# Patient Record
Sex: Female | Born: 1980 | Race: White | Hispanic: No | Marital: Single | Smoking: Former smoker
Health system: Southern US, Community
[De-identification: ages and names within clinical notes are randomized; demographics above are authoritative.]

## PROBLEM LIST (undated history)

## (undated) DIAGNOSIS — G8929 Other chronic pain: Secondary | ICD-10-CM

## (undated) DIAGNOSIS — I5023 Acute on chronic systolic (congestive) heart failure: Secondary | ICD-10-CM

## (undated) DIAGNOSIS — I10 Essential (primary) hypertension: Secondary | ICD-10-CM

## (undated) DIAGNOSIS — R51 Headache: Secondary | ICD-10-CM

## (undated) DIAGNOSIS — I5022 Chronic systolic (congestive) heart failure: Secondary | ICD-10-CM

## (undated) DIAGNOSIS — R0789 Other chest pain: Secondary | ICD-10-CM

## (undated) DIAGNOSIS — R519 Headache, unspecified: Secondary | ICD-10-CM

## (undated) DIAGNOSIS — Z9119 Patient's noncompliance with other medical treatment and regimen: Secondary | ICD-10-CM

## (undated) DIAGNOSIS — I110 Hypertensive heart disease with heart failure: Secondary | ICD-10-CM

## (undated) DIAGNOSIS — I447 Left bundle-branch block, unspecified: Secondary | ICD-10-CM

## (undated) DIAGNOSIS — I5020 Unspecified systolic (congestive) heart failure: Secondary | ICD-10-CM

## (undated) DIAGNOSIS — D509 Iron deficiency anemia, unspecified: Secondary | ICD-10-CM

## (undated) DIAGNOSIS — M549 Dorsalgia, unspecified: Secondary | ICD-10-CM

## (undated) DIAGNOSIS — M5430 Sciatica, unspecified side: Secondary | ICD-10-CM

## (undated) DIAGNOSIS — I429 Cardiomyopathy, unspecified: Secondary | ICD-10-CM

## (undated) DIAGNOSIS — Z91199 Patient's noncompliance with other medical treatment and regimen due to unspecified reason: Secondary | ICD-10-CM

## (undated) DIAGNOSIS — D649 Anemia, unspecified: Secondary | ICD-10-CM

## (undated) HISTORY — PX: TUBAL LIGATION: SHX77

## (undated) HISTORY — DX: Chronic systolic (congestive) heart failure: I50.22

## (undated) HISTORY — PX: CHOLECYSTECTOMY: SHX55

---

## 2004-01-20 ENCOUNTER — Inpatient Hospital Stay: Payer: Self-pay

## 2004-05-02 ENCOUNTER — Emergency Department: Payer: Self-pay | Admitting: Emergency Medicine

## 2004-07-19 ENCOUNTER — Emergency Department: Payer: Self-pay | Admitting: Emergency Medicine

## 2004-07-20 ENCOUNTER — Ambulatory Visit: Payer: Self-pay | Admitting: Emergency Medicine

## 2005-01-20 ENCOUNTER — Emergency Department: Payer: Self-pay | Admitting: Emergency Medicine

## 2005-01-20 ENCOUNTER — Other Ambulatory Visit: Payer: Self-pay

## 2005-05-04 ENCOUNTER — Emergency Department: Payer: Self-pay | Admitting: Emergency Medicine

## 2005-07-13 ENCOUNTER — Emergency Department: Payer: Self-pay | Admitting: Internal Medicine

## 2005-08-07 ENCOUNTER — Emergency Department: Payer: Self-pay | Admitting: Internal Medicine

## 2005-08-08 ENCOUNTER — Ambulatory Visit: Payer: Self-pay | Admitting: Internal Medicine

## 2005-10-13 ENCOUNTER — Emergency Department: Payer: Self-pay | Admitting: Emergency Medicine

## 2006-01-22 ENCOUNTER — Ambulatory Visit: Payer: Self-pay | Admitting: Internal Medicine

## 2006-03-11 ENCOUNTER — Emergency Department: Payer: Self-pay | Admitting: Unknown Physician Specialty

## 2006-05-20 ENCOUNTER — Ambulatory Visit: Payer: Self-pay | Admitting: Pain Medicine

## 2006-05-21 ENCOUNTER — Ambulatory Visit: Payer: Self-pay | Admitting: Pain Medicine

## 2006-05-29 ENCOUNTER — Emergency Department: Payer: Self-pay | Admitting: Emergency Medicine

## 2006-05-29 ENCOUNTER — Other Ambulatory Visit: Payer: Self-pay

## 2006-08-25 ENCOUNTER — Emergency Department: Payer: Self-pay | Admitting: Emergency Medicine

## 2006-09-02 ENCOUNTER — Emergency Department: Payer: Self-pay | Admitting: Emergency Medicine

## 2006-09-04 ENCOUNTER — Emergency Department: Payer: Self-pay | Admitting: Emergency Medicine

## 2006-09-16 ENCOUNTER — Other Ambulatory Visit: Payer: Self-pay

## 2006-09-16 ENCOUNTER — Inpatient Hospital Stay: Payer: Self-pay | Admitting: Internal Medicine

## 2007-03-06 ENCOUNTER — Emergency Department: Payer: Self-pay | Admitting: Unknown Physician Specialty

## 2007-03-24 ENCOUNTER — Encounter: Admission: RE | Admit: 2007-03-24 | Discharge: 2007-03-24 | Payer: Self-pay | Admitting: Neurology

## 2007-03-27 ENCOUNTER — Emergency Department: Payer: Self-pay | Admitting: Emergency Medicine

## 2007-04-03 ENCOUNTER — Ambulatory Visit: Payer: Self-pay | Admitting: Family Medicine

## 2007-04-24 ENCOUNTER — Emergency Department: Payer: Self-pay | Admitting: Emergency Medicine

## 2007-05-07 ENCOUNTER — Encounter: Payer: Self-pay | Admitting: Family Medicine

## 2007-06-21 ENCOUNTER — Emergency Department: Payer: Self-pay | Admitting: Emergency Medicine

## 2007-09-07 ENCOUNTER — Emergency Department: Payer: Self-pay

## 2008-06-27 ENCOUNTER — Emergency Department: Payer: Self-pay | Admitting: Emergency Medicine

## 2008-08-01 ENCOUNTER — Emergency Department: Payer: Self-pay | Admitting: Emergency Medicine

## 2008-10-01 ENCOUNTER — Emergency Department: Payer: Self-pay | Admitting: Internal Medicine

## 2008-11-27 ENCOUNTER — Emergency Department: Payer: Self-pay | Admitting: Emergency Medicine

## 2009-06-01 ENCOUNTER — Emergency Department: Payer: Self-pay | Admitting: Emergency Medicine

## 2009-08-29 ENCOUNTER — Emergency Department: Payer: Self-pay | Admitting: Emergency Medicine

## 2010-01-29 ENCOUNTER — Emergency Department: Payer: Self-pay | Admitting: Emergency Medicine

## 2010-05-29 ENCOUNTER — Emergency Department: Payer: Self-pay | Admitting: Emergency Medicine

## 2010-09-25 ENCOUNTER — Inpatient Hospital Stay: Payer: Self-pay | Admitting: Internal Medicine

## 2010-09-25 DIAGNOSIS — R9431 Abnormal electrocardiogram [ECG] [EKG]: Secondary | ICD-10-CM

## 2010-09-25 DIAGNOSIS — I517 Cardiomegaly: Secondary | ICD-10-CM

## 2010-09-25 DIAGNOSIS — R7989 Other specified abnormal findings of blood chemistry: Secondary | ICD-10-CM

## 2010-09-26 DIAGNOSIS — R9431 Abnormal electrocardiogram [ECG] [EKG]: Secondary | ICD-10-CM

## 2010-09-26 DIAGNOSIS — R7989 Other specified abnormal findings of blood chemistry: Secondary | ICD-10-CM

## 2010-10-24 ENCOUNTER — Emergency Department: Payer: Self-pay | Admitting: Emergency Medicine

## 2010-12-07 ENCOUNTER — Emergency Department: Payer: Self-pay | Admitting: *Deleted

## 2010-12-14 ENCOUNTER — Emergency Department: Payer: Self-pay | Admitting: Internal Medicine

## 2010-12-30 ENCOUNTER — Emergency Department: Payer: Self-pay | Admitting: Unknown Physician Specialty

## 2011-01-08 HISTORY — PX: CARDIAC CATHETERIZATION: SHX172

## 2011-01-23 ENCOUNTER — Emergency Department: Payer: Self-pay | Admitting: Internal Medicine

## 2011-01-23 LAB — TROPONIN I: Troponin-I: <0.02 ng/mL

## 2011-01-23 LAB — COMPREHENSIVE METABOLIC PANEL
Anion Gap: 15 (ref 7–16)
Bilirubin,Total: 0.5 mg/dL (ref 0.2–1.0)
Chloride: 103 mmol/L (ref 98–107)
Co2: 27 mmol/L (ref 21–32)
Creatinine: 0.67 mg/dL (ref 0.60–1.30)
EGFR (African American): >60
EGFR (Non-African Amer.): >60
Glucose: 97 mg/dL (ref 65–99)
Osmolality: 287 (ref 275–301)
Potassium: 2.9 mmol/L — ABNORMAL LOW (ref 3.5–5.1)
SGOT(AST): 20 U/L (ref 15–37)
SGPT (ALT): 13 U/L

## 2011-01-23 LAB — URINALYSIS, COMPLETE
Bacteria: NONE SEEN
Bilirubin,UR: NEGATIVE
Blood: NEGATIVE
Glucose,UR: NEGATIVE mg/dL (ref 0–75)
Leukocyte Esterase: NEGATIVE
Nitrite: NEGATIVE
Ph: 6 (ref 4.5–8.0)
Protein: 30
RBC,UR: <1 /HPF (ref 0–5)
Squamous Epithelial: 1
WBC UR: 2 /HPF (ref 0–5)

## 2011-01-23 LAB — CBC
HCT: 30.3 % — ABNORMAL LOW (ref 35.0–47.0)
HGB: 10 g/dL — ABNORMAL LOW (ref 12.0–16.0)
MCH: 27.2 pg (ref 26.0–34.0)
MCV: 83 fL (ref 80–100)
Platelet: 358 10*3/uL (ref 150–440)

## 2011-01-23 LAB — DRUG SCREEN, URINE
Barbiturates, Ur Screen: NEGATIVE (ref ?–200)
Cannabinoid 50 Ng, Ur ~~LOC~~: NEGATIVE (ref ?–50)
MDMA (Ecstasy)Ur Screen: NEGATIVE (ref ?–500)
Methadone, Ur Screen: NEGATIVE (ref ?–300)
Opiate, Ur Screen: POSITIVE (ref ?–300)
Tricyclic, Ur Screen: NEGATIVE (ref ?–1000)

## 2011-01-23 LAB — PROTIME-INR: Prothrombin Time: 12.2 secs (ref 11.5–14.7)

## 2011-01-23 LAB — CK TOTAL AND CKMB (NOT AT ARMC)
CK, Total: 48 U/L (ref 21–215)
CK-MB: 0.6 ng/mL (ref 0.5–3.6)

## 2011-05-08 DIAGNOSIS — I5022 Chronic systolic (congestive) heart failure: Secondary | ICD-10-CM

## 2011-05-08 HISTORY — DX: Chronic systolic (congestive) heart failure: I50.22

## 2011-06-06 ENCOUNTER — Inpatient Hospital Stay (HOSPITAL_COMMUNITY): Payer: Medicaid Other

## 2011-06-06 ENCOUNTER — Encounter (HOSPITAL_COMMUNITY): Payer: Self-pay

## 2011-06-06 ENCOUNTER — Inpatient Hospital Stay (HOSPITAL_COMMUNITY)
Admission: EM | Admit: 2011-06-06 | Discharge: 2011-06-12 | DRG: 287 | Disposition: A | Discharge status: Home / Self Care | Payer: Medicaid Other | Attending: Internal Medicine | Admitting: Internal Medicine

## 2011-06-06 DIAGNOSIS — R809 Proteinuria, unspecified: Secondary | ICD-10-CM

## 2011-06-06 DIAGNOSIS — Z9089 Acquired absence of other organs: Secondary | ICD-10-CM

## 2011-06-06 DIAGNOSIS — I10 Essential (primary) hypertension: Secondary | ICD-10-CM

## 2011-06-06 DIAGNOSIS — I447 Left bundle-branch block, unspecified: Secondary | ICD-10-CM

## 2011-06-06 DIAGNOSIS — G43909 Migraine, unspecified, not intractable, without status migrainosus: Secondary | ICD-10-CM | POA: Diagnosis not present

## 2011-06-06 DIAGNOSIS — Z91199 Patient's noncompliance with other medical treatment and regimen due to unspecified reason: Secondary | ICD-10-CM

## 2011-06-06 DIAGNOSIS — J45909 Unspecified asthma, uncomplicated: Secondary | ICD-10-CM | POA: Diagnosis present

## 2011-06-06 DIAGNOSIS — M25579 Pain in unspecified ankle and joints of unspecified foot: Secondary | ICD-10-CM | POA: Diagnosis not present

## 2011-06-06 DIAGNOSIS — I5041 Acute combined systolic (congestive) and diastolic (congestive) heart failure: Secondary | ICD-10-CM

## 2011-06-06 DIAGNOSIS — Z9119 Patient's noncompliance with other medical treatment and regimen: Secondary | ICD-10-CM

## 2011-06-06 DIAGNOSIS — D500 Iron deficiency anemia secondary to blood loss (chronic): Secondary | ICD-10-CM | POA: Diagnosis present

## 2011-06-06 DIAGNOSIS — I509 Heart failure, unspecified: Secondary | ICD-10-CM | POA: Diagnosis present

## 2011-06-06 DIAGNOSIS — D649 Anemia, unspecified: Secondary | ICD-10-CM

## 2011-06-06 DIAGNOSIS — I5023 Acute on chronic systolic (congestive) heart failure: Secondary | ICD-10-CM | POA: Diagnosis present

## 2011-06-06 DIAGNOSIS — E876 Hypokalemia: Secondary | ICD-10-CM

## 2011-06-06 DIAGNOSIS — D509 Iron deficiency anemia, unspecified: Secondary | ICD-10-CM

## 2011-06-06 DIAGNOSIS — Z8249 Family history of ischemic heart disease and other diseases of the circulatory system: Secondary | ICD-10-CM

## 2011-06-06 DIAGNOSIS — M7989 Other specified soft tissue disorders: Secondary | ICD-10-CM

## 2011-06-06 DIAGNOSIS — Z9889 Other specified postprocedural states: Secondary | ICD-10-CM

## 2011-06-06 DIAGNOSIS — Z9851 Tubal ligation status: Secondary | ICD-10-CM

## 2011-06-06 DIAGNOSIS — Z87891 Personal history of nicotine dependence: Secondary | ICD-10-CM

## 2011-06-06 DIAGNOSIS — I5020 Unspecified systolic (congestive) heart failure: Secondary | ICD-10-CM

## 2011-06-06 DIAGNOSIS — I110 Hypertensive heart disease with heart failure: Principal | ICD-10-CM

## 2011-06-06 DIAGNOSIS — I428 Other cardiomyopathies: Secondary | ICD-10-CM | POA: Diagnosis present

## 2011-06-06 HISTORY — DX: Essential (primary) hypertension: I10

## 2011-06-06 LAB — RAPID URINE DRUG SCREEN, HOSP PERFORMED
Barbiturates: NOT DETECTED
Benzodiazepines: POSITIVE — AB

## 2011-06-06 LAB — COMPREHENSIVE METABOLIC PANEL
AST: 11 U/L (ref 0–37)
Albumin: 3.3 g/dL — ABNORMAL LOW (ref 3.5–5.2)
Alkaline Phosphatase: 54 U/L (ref 39–117)
BUN: 14 mg/dL (ref 6–23)
CO2: 26 mEq/L (ref 19–32)
CO2: 24 mEq/L (ref 19–32)
Calcium: 8.7 mg/dL (ref 8.4–10.5)
Chloride: 104 mEq/L (ref 96–112)
GFR calc Af Amer: >90 mL/min (ref >90)
Glucose, Bld: 109 mg/dL — ABNORMAL HIGH (ref 70–99)
Glucose, Bld: 121 mg/dL — ABNORMAL HIGH (ref 70–99)
Potassium: 2.9 mEq/L — ABNORMAL LOW (ref 3.5–5.1)
Sodium: 139 mEq/L (ref 135–145)
Total Bilirubin: 0.4 mg/dL (ref 0.3–1.2)
Total Bilirubin: 0.4 mg/dL (ref 0.3–1.2)
Total Protein: 5.9 g/dL — ABNORMAL LOW (ref 6.0–8.3)

## 2011-06-06 LAB — DIFFERENTIAL
Eosinophils Absolute: 0.2 10*3/uL (ref 0.0–0.7)
Eosinophils Relative: 3 % (ref 0–5)
Lymphocytes Relative: 26 % (ref 12–46)
Lymphs Abs: 1.7 10*3/uL (ref 0.7–4.0)
Monocytes Absolute: 0.4 10*3/uL (ref 0.1–1.0)
Monocytes Relative: 6 % (ref 3–12)

## 2011-06-06 LAB — CBC
HCT: 26.9 % — ABNORMAL LOW (ref 36.0–46.0)
MCH: 22.2 pg — ABNORMAL LOW (ref 26.0–34.0)
MCV: 78.7 fL (ref 78.0–100.0)
Platelets: 227 10*3/uL (ref 150–400)
RBC: 3.42 MIL/uL — ABNORMAL LOW (ref 3.87–5.11)
WBC: 6.3 10*3/uL (ref 4.0–10.5)

## 2011-06-06 LAB — LIPID PANEL
LDL Cholesterol: 99 mg/dL (ref 0–99)
Triglycerides: 101 mg/dL (ref <150)

## 2011-06-06 LAB — ABO/RH: ABO/RH(D): B NEG

## 2011-06-06 LAB — CARDIAC PANEL(CRET KIN+CKTOT+MB+TROPI)
CK, MB: 3.6 ng/mL (ref 0.3–4.0)
Troponin I: <0.3 ng/mL (ref <0.30)

## 2011-06-06 LAB — BLOOD GAS, ARTERIAL
Acid-base deficit: 1.5 mmol/L (ref 0.0–2.0)
Bicarbonate: 22.2 mEq/L (ref 20.0–24.0)
FIO2: 0.21 %
O2 Saturation: 98 %
Patient temperature: 98.6
pO2, Arterial: 82.2 mmHg (ref 80.0–100.0)

## 2011-06-06 LAB — URINALYSIS, ROUTINE W REFLEX MICROSCOPIC
Glucose, UA: NEGATIVE mg/dL
Hgb urine dipstick: NEGATIVE
Ketones, ur: NEGATIVE mg/dL
Leukocytes, UA: NEGATIVE
Protein, ur: 30 mg/dL — AB
pH: 6.5 (ref 5.0–8.0)

## 2011-06-06 LAB — IRON AND TIBC
Saturation Ratios: 6 % — ABNORMAL LOW (ref 20–55)
UIBC: 356 ug/dL (ref 125–400)

## 2011-06-06 LAB — D-DIMER, QUANTITATIVE: D-Dimer, Quant: 1.74 ug/mL-FEU — ABNORMAL HIGH (ref 0.00–0.48)

## 2011-06-06 LAB — MAGNESIUM: Magnesium: 1.8 mg/dL (ref 1.5–2.5)

## 2011-06-06 LAB — URINE MICROSCOPIC-ADD ON

## 2011-06-06 LAB — RETICULOCYTES
RBC.: 3.56 MIL/uL — ABNORMAL LOW (ref 3.87–5.11)
Retic Ct Pct: 4.2 % — ABNORMAL HIGH (ref 0.4–3.1)

## 2011-06-06 LAB — HIV ANTIBODY (ROUTINE TESTING W REFLEX): HIV: NONREACTIVE

## 2011-06-06 MED ORDER — MORPHINE SULFATE 4 MG/ML IJ SOLN
4.0000 mg | Freq: Once | INTRAMUSCULAR | Status: AC
  Administered 2011-06-06: 4 mg via INTRAVENOUS
  Filled 2011-06-06: qty 1

## 2011-06-06 MED ORDER — POTASSIUM CHLORIDE CRYS ER 20 MEQ PO TBCR
40.0000 meq | EXTENDED_RELEASE_TABLET | Freq: Once | ORAL | Status: AC
  Administered 2011-06-06: 40 meq via ORAL
  Filled 2011-06-06: qty 2

## 2011-06-06 MED ORDER — ENOXAPARIN SODIUM 40 MG/0.4ML ~~LOC~~ SOLN
40.0000 mg | SUBCUTANEOUS | Status: DC
  Administered 2011-06-07 – 2011-06-10 (×4): 40 mg via SUBCUTANEOUS
  Filled 2011-06-06 (×7): qty 0.4

## 2011-06-06 MED ORDER — MORPHINE SULFATE 2 MG/ML IJ SOLN
1.0000 mg | INTRAMUSCULAR | Status: DC | PRN
  Administered 2011-06-06: 1 mg via INTRAVENOUS
  Filled 2011-06-06 (×2): qty 1

## 2011-06-06 MED ORDER — SODIUM CHLORIDE 0.9 % IV SOLN
INTRAVENOUS | Status: DC
  Administered 2011-06-06: 75 mL/h via INTRAVENOUS

## 2011-06-06 MED ORDER — POTASSIUM CHLORIDE CRYS ER 20 MEQ PO TBCR
40.0000 meq | EXTENDED_RELEASE_TABLET | Freq: Three times a day (TID) | ORAL | Status: DC
  Administered 2011-06-06 – 2011-06-07 (×2): 40 meq via ORAL
  Filled 2011-06-06 (×4): qty 2

## 2011-06-06 MED ORDER — HYDRALAZINE HCL 20 MG/ML IJ SOLN: 10.0000 mg | Freq: Three times a day (TID) | INTRAMUSCULAR | Status: DC | PRN

## 2011-06-06 MED ORDER — ATENOLOL 50 MG PO TABS
50.0000 mg | ORAL_TABLET | Freq: Every day | ORAL | Status: DC
  Administered 2011-06-06: 50 mg via ORAL
  Filled 2011-06-06: qty 1

## 2011-06-06 MED ORDER — ONDANSETRON HCL 4 MG PO TABS: 4.0000 mg | ORAL_TABLET | Freq: Four times a day (QID) | ORAL | Status: DC | PRN

## 2011-06-06 MED ORDER — HYDRALAZINE HCL 20 MG/ML IJ SOLN
10.0000 mg | Freq: Four times a day (QID) | INTRAMUSCULAR | Status: DC | PRN
  Administered 2011-06-07 (×2): 10 mg via INTRAVENOUS
  Filled 2011-06-06 (×2): qty 1
  Filled 2011-06-06: qty 0.5

## 2011-06-06 MED ORDER — LABETALOL HCL 100 MG PO TABS
100.0000 mg | ORAL_TABLET | Freq: Two times a day (BID) | ORAL | Status: DC
  Administered 2011-06-06 – 2011-06-07 (×2): 100 mg via ORAL
  Filled 2011-06-06 (×3): qty 1

## 2011-06-06 MED ORDER — SODIUM CHLORIDE 0.9 % IV BOLUS (SEPSIS)
500.0000 mL | Freq: Once | INTRAVENOUS | Status: AC
  Administered 2011-06-06: 500 mL via INTRAVENOUS

## 2011-06-06 MED ORDER — MOXIFLOXACIN HCL IN NACL 400 MG/250ML IV SOLN
400.0000 mg | Freq: Every day | INTRAVENOUS | Status: DC
  Administered 2011-06-07 – 2011-06-09 (×3): 400 mg via INTRAVENOUS
  Filled 2011-06-06 (×6): qty 250

## 2011-06-06 MED ORDER — HYDROCODONE-ACETAMINOPHEN 5-325 MG PO TABS
1.0000 | ORAL_TABLET | ORAL | Status: DC | PRN
  Administered 2011-06-06 – 2011-06-10 (×11): 2 via ORAL
  Administered 2011-06-10 (×2): 1 via ORAL
  Administered 2011-06-12: 2 via ORAL
  Filled 2011-06-06 (×8): qty 2
  Filled 2011-06-06: qty 1
  Filled 2011-06-06 (×3): qty 2
  Filled 2011-06-06: qty 1

## 2011-06-06 MED ORDER — ONDANSETRON HCL 4 MG/2ML IJ SOLN: 4.0000 mg | Freq: Four times a day (QID) | INTRAMUSCULAR | Status: DC | PRN

## 2011-06-06 NOTE — H&P (Signed)
PCP:  No primary provider on file.   DOA:  06/06/2011  7:39 AM  Chief Complaint:  Lower extremity swelling  HPI: Pt is 31 yo female with no significant PMH who presents to [facility] ED with main concerns of progressively worsening lower extremity swelling, new onset and no previous similar episodes in the past. This has been getting progressively worse over the past 1 - 2 weeks prior to admission. She also reports mostly exertional shortness of breath and generalized fatigue. She denies any fever, chills, other systemic symptoms of night sweats, weight loss or gain, no specific abdominal or urinary concerns, no specific focal neurologic weakness, no headaches, no visual changes, no lumps or nodules noted anywhere on her body.   Allergies: Allergies  Allergen Reactions  . Doxycycline Shortness Of Breath    Prior to Admission medications   Not on File    Past Medical History  Diagnosis Date  . Hypertension     Past Surgical History  Procedure Date  . Cesarean section   . Cholecystectomy   . Tubal ligation     Social History:  reports that she has quit smoking. She quit smokeless tobacco use about 5 years ago. She reports that she does not drink alcohol or use illicit drugs.  Family History  Problem Relation Age of Onset  . Heart failure Mother   . Hypertension Mother     Review of Systems:  Constitutional: Denies fever, chills, diaphoresis, appetite change, positive for fatigue.  HEENT: Denies photophobia, eye pain, hearing loss, ear pain, congestion, sore throat, rhinorrhea, sneezing, mouth sores, trouble swallowing, neck pain, neck stiffness and tinnitus.   Respiratory: Positive for  SOB, DOE, cough, denies chest tightness,  and wheezing.   Cardiovascular: Denies chest pain, palpitations, positive for leg swelling.  Gastrointestinal: Denies nausea, vomiting, abdominal pain, diarrhea, constipation, blood in stool and abdominal distention.  Genitourinary: Denies dysuria,  urgency, frequency, hematuria, flank pain and difficulty urinating.  Musculoskeletal: Denies myalgias, back pain, joint swelling, arthralgias and gait problem.  Skin: Denies pallor, rash and wound.  Neurological: Denies dizziness, seizures, syncope, weakness, light-headedness, numbness and headaches.  Hematological: Denies adenopathy. Easy bruising, personal or family bleeding history  Psychiatric/Behavioral: Denies suicidal ideation, mood changes, confusion, nervousness, sleep disturbance and agitation  Physical Exam:  Filed Vitals:   06/06/11 0930 06/06/11 1000 06/06/11 1030 06/06/11 1100  BP: 179/108 181/121 173/124 172/113  Pulse: 104 104 104 103  Temp:      TempSrc:      Resp:      SpO2: 95% 91% 96% 93%    Constitutional: Vital signs reviewed.  Patient is in no acute distress and cooperative with exam. Alert and oriented x3.  Head: Normocephalic and atraumatic Ear: TM normal bilaterally Mouth: no erythema or exudates, MMM Eyes: PERRL, EOMI, conjunctivae normal, No scleral icterus.  Neck: Supple, Trachea midline normal ROM, No JVD, mass, thyromegaly, or carotid bruit present.  Cardiovascular: Regular rhythm, tachycardic, S1 normal, S2 normal, no MRG, pulses symmetric and intact bilaterally Pulmonary/Chest: CTAB, no wheezes, rales, or rhonchi Abdominal: Soft. Non-tender, non-distended, bowel sounds are normal, no masses, organomegaly, or guarding present.  GU: no CVA tenderness Musculoskeletal: No joint deformities, erythema, or stiffness, ROM full and no nontender Ext: +2 bilateral pitting lower extremity edema, no cyanosis, pulses palpable bilaterally (DP and PT) Hematology: no cervical, inginal, or axillary adenopathy.  Neurological: A&O x3, Strenght is normal and symmetric bilaterally, cranial nerve II-XII are grossly intact, no focal motor deficit, sensory intact to light  touch bilaterally.  Skin: Warm, dry and intact. No rash, cyanosis, or clubbing.  Psychiatric: Normal  mood and affect. speech and behavior is normal. Judgment and thought content normal. Cognition and memory are normal.   Labs on Admission:      Component Value Range   Sodium 139  135 - 145 (mEq/L)   Potassium 2.3 (*) 3.5 - 5.1 (mEq/L)   Chloride 103  96 - 112 (mEq/L)   CO2 26  19 - 32 (mEq/L)   Glucose, Bld 109  70 - 99 (mg/dL)   BUN 13  6 - 23 (mg/dL)   Creatinine, Ser 1.61  0.50 - 1.10 (mg/dL)   Calcium 8.7  8.4 - 09.6 (mg/dL)   Total Protein 5.9 (*) 6.0 - 8.3 (g/dL)   Albumin 3.3 (*) 3.5 - 5.2 (g/dL)   AST 11  0 - 37 (U/L)   ALT 15  0 - 35 (U/L)   Alkaline Phosphatase 52  39 - 117 (U/L)   Total Bilirubin 0.4  0.3 - 1.2 (mg/dL)   GFR calc non Af Amer >90  >90 (mL/min)   GFR calc Af Amer >90  >90 (mL/min)          Component Value Range   WBC 6.3  4.0 - 10.5 (K/uL)   Hemoglobin 7.6 (*) 12.0 - 15.0 (g/dL)   HCT 04.5 (*) 40.9 - 46.0 (%)   MCV 78.7  78.0 - 100.0 (fL)   Platelets 227  150 - 400 (K/uL)          Component Value Range   Color, Urine YELLOW  YELLOW    APPearance CLEAR  CLEAR    Specific Gravity, Urine 1.029  1.005 - 1.030    pH 6.5  5.0 - 8.0    Glucose, UA NEGATIVE  NEGATIVE (mg/dL)   Hgb urine dipstick NEGATIVE  NEGATIVE    Bilirubin Urine SMALL (*) NEGATIVE    Ketones, ur NEGATIVE  NEGATIVE (mg/dL)   Protein, ur 30 (*) NEGATIVE (mg/dL)   Urobilinogen, UA 1.0  0.0 - 1.0 (mg/dL)   Nitrite NEGATIVE  NEGATIVE    Leukocytes, UA NEGATIVE  NEGATIVE           Component Value Range   Preg Test, Ur NEGATIVE  NEGATIVE           Component Value Range   Squamous Epithelial / LPF RARE  RARE    WBC, UA 3-6  <3 (WBC/hpf)   RBC / HPF 0-2  <3 (RBC/hpf)   Bacteria, UA MANY (*) RARE     Radiological Exams on Admission:  CXR: 06/06/2011 IMPRESSION:  1. Somewhat prominent interstitial markings with peribronchial thickening. Possible bronchitis but cannot exclude viral pneumonia.  2. Cardiomegaly.  Renal US 06/06/2011 Negative for acute  findings  Assessment/Plan  Lower extremity swelling - unclear etiology at this time but given degree of proteinuria worrisome for nephrotic syndrome - will admit the pt to telemetry for further evaluation and management - proceed with autoimmune disease work up and nephrotic syndrome work up  Acute respiratory failure of unclear etiology  - unclear etiology but per CXR noted cardiomegaly and ? PNA - d-dimer elevated - will check CT angio chest to rule put PE - order 2 D ECHO - follow up on BNP, cardiac enzymes - initiate empiric antibiotic for now Avelox IV - will check ABG  HTN, accelaerated - accelerated and unclear etiology - will start Labetalol 100 mg BID PO - check FLP  Anemia  -  unclear etiology - will order anemia panel - transfuse 2 units PRBC's - CBC in AM  Hypokalemia - magnesium is within normal limits - will supplement  - check 12 lead EKG - BMP now for follow up  Proteinuria - unclear etiology - will check autoimmune panel - please note that renal US is unremarkable - random urine spot protein  Cardiomegaly  - noted on CXR - will check CE's and 2 D ECHO  DVT Prophylaxis - Lovenox  Code Status - Full  Education  - test results and diagnostic studies were discussed with patient  - patient verbalized the understanding - questions were answered at the bedside and contact information was provided for additional questions or concerns  Time Spent on Admission: Over 30 minutes  MAGICK-Azzan Butler 06/06/2011, 11:20 AM  [facility] Pager # [phone] Main Office # [phone]

## 2011-06-06 NOTE — Progress Notes (Signed)
CM spoke with pt who confirms self pay [facility] [place] resident with no have a pcp county. Recently moved from [place] and had medicaid in [place] but she confirms she let it elapse. Discussed the importance of a pcp for f/u. Reviewed Health connect number to assist with finding self pay provider close to pt's new residence. Reviewed resources for [facility], general medial clinics, [facility] [email or web address], housing, DSS, health Department and other resources in [place] including crisis programs Pt voiced understanding and appreciation of resources provided.

## 2011-06-06 NOTE — ED Notes (Signed)
[place] Tech in room performing test.

## 2011-06-06 NOTE — ED Provider Notes (Signed)
History     CSN: [id number]  Arrival date & time 06/06/11  4098   First MD Initiated Contact with Patient 06/06/11 [phone]      Chief Complaint  Patient presents with  . Leg Swelling    (Consider location/radiation/quality/duration/timing/severity/associated sxs/prior treatment) The history is provided by the patient.   the patient is a 31 year old, female, with a history of hypertension, who presents to emergency department complaining of bilateral leg swelling and pain.  She denies fevers, chills, rash.  She does have a slight nonproductive cough, and shortness of breath, which is worse.  At nighttime.  She denies recent travel or surgery.  She denies a history of renal or liver disease.  Past Medical History  Diagnosis Date  . Hypertension     Past Surgical History  Procedure Date  . Cesarean section   . Cholecystectomy   . Tubal ligation     Family History  Problem Relation Age of Onset  . Heart failure Mother   . Hypertension Mother     History  Substance Use Topics  . Smoking status: Former Games developer  . Smokeless tobacco: Former Neurosurgeon    Quit date: 06/06/2006  . Alcohol Use: No    OB History    Grav Para Term Preterm Abortions TAB SAB Ect Mult Living                  Review of Systems  Constitutional: Negative for fever and chills.  Respiratory: Positive for cough and shortness of breath. Negative for chest tightness.   Cardiovascular: Positive for leg swelling. Negative for chest pain.  Gastrointestinal: Negative for nausea, vomiting and abdominal pain.  Genitourinary: Negative for dysuria and flank pain.  Musculoskeletal: Positive for gait problem.  Skin: Negative for rash.  Neurological: Negative for headaches.  Psychiatric/Behavioral: Negative for confusion.  All other systems reviewed and are negative.    Allergies  Doxycycline  Home Medications  No current outpatient prescriptions on file.  BP 168/112  Pulse 109  Temp(Src) 97.5 F (36.4 C)  (Oral)  Resp 18  SpO2 98%  LMP 05/08/2011  Physical Exam  Nursing note and vitals reviewed. Constitutional: She is oriented to person, place, and time. She appears well-developed and well-nourished.  HENT:  Head: Normocephalic and atraumatic.  Eyes: Conjunctivae and EOM are normal.  Neck: Normal range of motion. Neck supple.  Cardiovascular:  No murmur heard.      Tachycardia  Pulmonary/Chest: Effort normal and breath sounds normal. No respiratory distress.  Abdominal: Soft. Bowel sounds are normal. She exhibits no mass.  Musculoskeletal: Normal range of motion. She exhibits edema and tenderness.       2+ pitting edema, bilateral lower extremities from the feet to shin  Neurological: She is alert and oriented to person, place, and time.  Skin: Skin is warm and dry. No rash noted. No erythema.  Psychiatric: She has a normal mood and affect. Thought content normal.    ED Course  Procedures (including critical care time) 31 year old, female, with history of hypertension, presents with bilateral lower extremity pain, and swelling.  No signs of cellulitis.  No risk factors for pulmonary embolism, or DVT.  She does have a cough, and shortness of breath.  I do not hear a murmur and she has no risk factors for cardiomyopathy.  We'll perform laboratory testing, and treat her pain in the emergency department   Labs Reviewed  COMPREHENSIVE METABOLIC PANEL  CBC  DIFFERENTIAL  URINALYSIS, ROUTINE W REFLEX MICROSCOPIC  PREGNANCY, URINE   No results found.   No diagnosis found.  11:10 AM Pt appears listless.  Will give more K and consult for admission to eval possible nephrotic syndrome, hypokalemia and anemia  11:22 AM Spoke with the [facility] md.  She will admit  MDM   Bilateral leg pain and swelling Hypokalemia Proteinuria anemia        Cheri Guppy, MD 06/06/11 1122

## 2011-06-06 NOTE — ED Notes (Signed)
Patient states she has had increased swelling to feet, legs, and now thighs swelling. Patient states that she has a non productive cough and SOB at night. Patient denies fever or joint pain.

## 2011-06-07 ENCOUNTER — Other Ambulatory Visit: Payer: Self-pay

## 2011-06-07 DIAGNOSIS — I5020 Unspecified systolic (congestive) heart failure: Secondary | ICD-10-CM

## 2011-06-07 DIAGNOSIS — D649 Anemia, unspecified: Secondary | ICD-10-CM

## 2011-06-07 DIAGNOSIS — R809 Proteinuria, unspecified: Secondary | ICD-10-CM

## 2011-06-07 DIAGNOSIS — I10 Essential (primary) hypertension: Secondary | ICD-10-CM

## 2011-06-07 DIAGNOSIS — M7989 Other specified soft tissue disorders: Secondary | ICD-10-CM

## 2011-06-07 DIAGNOSIS — I447 Left bundle-branch block, unspecified: Secondary | ICD-10-CM

## 2011-06-07 DIAGNOSIS — E876 Hypokalemia: Secondary | ICD-10-CM

## 2011-06-07 DIAGNOSIS — R0602 Shortness of breath: Secondary | ICD-10-CM

## 2011-06-07 LAB — CARDIAC PANEL(CRET KIN+CKTOT+MB+TROPI)
CK, MB: 3.1 ng/mL (ref 0.3–4.0)
Total CK: 38 U/L (ref 7–177)

## 2011-06-07 LAB — ANCA SCREEN W REFLEX TITER: c-ANCA Screen: NEGATIVE

## 2011-06-07 LAB — CBC
HCT: 31.7 % — ABNORMAL LOW (ref 36.0–46.0)
Hemoglobin: 9.3 g/dL — ABNORMAL LOW (ref 12.0–15.0)
MCH: 23 pg — ABNORMAL LOW (ref 26.0–34.0)
MCV: 78.3 fL (ref 78.0–100.0)
RBC: 4.05 MIL/uL (ref 3.87–5.11)
WBC: 8.4 10*3/uL (ref 4.0–10.5)

## 2011-06-07 LAB — MPO/PR-3 (ANCA) ANTIBODIES: Myeloperoxidase Abs: <1 AU/mL (ref <20)

## 2011-06-07 LAB — BASIC METABOLIC PANEL
BUN: 13 mg/dL (ref 6–23)
CO2: 20 mEq/L (ref 19–32)
Calcium: 8.7 mg/dL (ref 8.4–10.5)
Chloride: 102 mEq/L (ref 96–112)
Creatinine, Ser: 0.61 mg/dL (ref 0.50–1.10)
Glucose, Bld: 115 mg/dL — ABNORMAL HIGH (ref 70–99)

## 2011-06-07 LAB — URINALYSIS, ROUTINE W REFLEX MICROSCOPIC
Ketones, ur: NEGATIVE mg/dL
Leukocytes, UA: NEGATIVE
Nitrite: NEGATIVE
Protein, ur: 30 mg/dL — AB

## 2011-06-07 LAB — URINE MICROSCOPIC-ADD ON

## 2011-06-07 LAB — HEMOGLOBIN A1C: Mean Plasma Glucose: 100 mg/dL (ref <117)

## 2011-06-07 LAB — C4 COMPLEMENT: Complement C4, Body Fluid: 23 mg/dL (ref 10–40)

## 2011-06-07 MED ORDER — SODIUM CHLORIDE 0.9 % IJ SOLN: 3.0000 mL | Freq: Two times a day (BID) | INTRAMUSCULAR | Status: DC

## 2011-06-07 MED ORDER — SPIRONOLACTONE 12.5 MG HALF TABLET
12.5000 mg | ORAL_TABLET | Freq: Every day | ORAL | Status: DC
  Administered 2011-06-07 – 2011-06-08 (×2): 12.5 mg via ORAL
  Filled 2011-06-07 (×4): qty 1

## 2011-06-07 MED ORDER — PANTOPRAZOLE SODIUM 40 MG IV SOLR
40.0000 mg | Freq: Every day | INTRAVENOUS | Status: DC
  Administered 2011-06-07 – 2011-06-09 (×3): 40 mg via INTRAVENOUS
  Filled 2011-06-07 (×4): qty 40

## 2011-06-07 MED ORDER — ASPIRIN 81 MG PO CHEW
324.0000 mg | CHEWABLE_TABLET | ORAL | Status: AC
  Administered 2011-06-10: 324 mg via ORAL
  Filled 2011-06-07: qty 4

## 2011-06-07 MED ORDER — SODIUM CHLORIDE 0.9 % IV SOLN: 250.0000 mL | INTRAVENOUS | Status: DC | PRN

## 2011-06-07 MED ORDER — POTASSIUM CHLORIDE CRYS ER 20 MEQ PO TBCR
40.0000 meq | EXTENDED_RELEASE_TABLET | Freq: Every day | ORAL | Status: DC
  Filled 2011-06-07: qty 2

## 2011-06-07 MED ORDER — DIAZEPAM 5 MG PO TABS: 5.0000 mg | ORAL_TABLET | ORAL | Status: DC

## 2011-06-07 MED ORDER — RAMIPRIL 5 MG PO CAPS
5.0000 mg | ORAL_CAPSULE | Freq: Every day | ORAL | Status: DC
  Administered 2011-06-07 – 2011-06-12 (×5): 5 mg via ORAL
  Filled 2011-06-07 (×6): qty 1

## 2011-06-07 MED ORDER — FUROSEMIDE 10 MG/ML IJ SOLN
40.0000 mg | Freq: Once | INTRAMUSCULAR | Status: DC
  Filled 2011-06-07: qty 4

## 2011-06-07 MED ORDER — FERROUS SULFATE 325 (65 FE) MG PO TABS
325.0000 mg | ORAL_TABLET | Freq: Two times a day (BID) | ORAL | Status: DC
  Administered 2011-06-07 – 2011-06-11 (×8): 325 mg via ORAL
  Filled 2011-06-07 (×13): qty 1

## 2011-06-07 MED ORDER — IOHEXOL 300 MG/ML  SOLN
100.0000 mL | Freq: Once | INTRAMUSCULAR | Status: AC | PRN
  Administered 2011-06-07: 100 mL via INTRAVENOUS

## 2011-06-07 MED ORDER — CARVEDILOL 6.25 MG PO TABS
6.2500 mg | ORAL_TABLET | Freq: Two times a day (BID) | ORAL | Status: DC
  Administered 2011-06-07 – 2011-06-09 (×5): 6.25 mg via ORAL
  Filled 2011-06-07 (×10): qty 1

## 2011-06-07 MED ORDER — SODIUM CHLORIDE 0.9 % IJ SOLN: 3.0000 mL | INTRAMUSCULAR | Status: DC | PRN

## 2011-06-07 MED ORDER — FUROSEMIDE 10 MG/ML IJ SOLN
40.0000 mg | Freq: Two times a day (BID) | INTRAMUSCULAR | Status: DC
  Administered 2011-06-07 (×2): 40 mg via INTRAVENOUS
  Filled 2011-06-07 (×6): qty 4

## 2011-06-07 NOTE — Progress Notes (Signed)
Pt was experiencing some SOB towards end of first blood transfusion. Pt had experienced slight SOB before blood administration earlier in the night.  Pt also complained of worsening indigestion, sharp pains in her chest.  PA on call notified, Lance Coon. New orders received, will continue to monitor pt.

## 2011-06-07 NOTE — Consult Note (Addendum)
CARDIOLOGY CONSULT NOTE  Patient ID: Laurie Wade MRN: [id number] DOB/AGE: 1980-10-24 31 y.o.  Admit date: 06/06/2011 Referring Physician Primary PhysicianNo primary provider on file. Primary Cardiologist    Karlene Lineman Reason for Consultation  HPI:  The patient is admitted with shortness of breath and fluid overload. D-dimer was elevated but CT scan reveals no pulmonary embolus. There are bilateral pleural effusions. There is a very small pericardial effusion. The patient has significant edema. The patient says that she had some edema at times when she was pregnant in the past. Her youngest child is 81 years old. She said she had some brief edema several weeks ago that improved. Then recently she began to have increasing edema. She was admitted with shortness of breath, pleural effusions, hemoglobin of 7.6. We are asked to consult to help with further evaluation. Also she had significant hypokalemia on admission.  Echocardiogram was done early today. She has a significant interventricular conduction delay. There is significant dyssynergy of the septum. This could be related to left bundle. However in addition there is akinesis of the inferior wall and severe hypokinesis of the anterior wall. The inferolateral wall and the lateral walls move better than the other walls. There is hypokinesis of the apex. The ejection fraction is in the 30% range. There is moderate left ventricular hypertrophy with mild mitral regurgitation. There is questionable left ventricular band. There is diastolic dysfunction with elevated filling pressures. There is a small pericardial effusion. There may also be mild right ventricular dysfunction.  The patient gives no prior history of cardiac disease. At this time I cannot document how long she has had a left bundle.     Review of systems:    Patient denies fever, chills, headache, sweats, rash, change in vision, change in hearing, chest pain, nausea vomiting,  urinary symptoms. All other systems are reviewed and are negative.  Past Medical History  Diagnosis Date  . Hypertension   . Asthma     Family History  Problem Relation Age of Onset  . Heart failure Mother   . Hypertension Mother     History   Social History  . Marital Status: Single    Spouse Name: N/A    Number of Children: N/A  . Years of Education: N/A   Occupational History  . Not on file.   Social History Main Topics  . Smoking status: Former Games developer  . Smokeless tobacco: Former Neurosurgeon    Quit date: 06/06/2006  . Alcohol Use: No  . Drug Use: No  . Sexually Active: No   Other Topics Concern  . Not on file   Social History Narrative  . No narrative on file    Past Surgical History  Procedure Date  . Cesarean section   . Cholecystectomy   . Tubal ligation      No prescriptions prior to admission    Physical Exam: Blood pressure 111/73, pulse 91, temperature 96.9 F (36.1 C), temperature source Axillary, resp. rate 20, height 5\' 6"  (1.676 m), weight 189 lb 11.2 oz (86.047 kg), last menstrual period 05/08/2011, SpO2 99.00%.    Patient is oriented to person time and place. Affect is normal. There is no jugulovenous distention. Lungs reveal a few scattered rhonchi and possibly a few basilar rales. There no carotid bruits. There is no respiratory distress. Cardiac exam reveals S1 and S2. There no clicks or significant murmurs. The abdomen is soft. The patient has 2-3+ peripheral edema up into her upper thighs.  There no musculoskeletal deformities. There are no skin rashes.    Lab Results  Component Value Date   WBC 8.4 06/07/2011   HGB 9.3* 06/07/2011   HCT 31.7* 06/07/2011   MCV 78.3 06/07/2011   PLT 293 06/07/2011    Lab 06/07/11 0750 06/06/11 1615  NA 134* --  K 3.7 --  CL 102 --  CO2 20 --  BUN 13 --  CREATININE 0.61 --  CALCIUM 8.7 --  PROT -- 5.8*  BILITOT -- 0.4  ALKPHOS -- 54  ALT -- 16  AST -- 13  GLUCOSE 115* --   Lab Results  Component  Value Date   CKTOTAL 38 06/07/2011   CKMB 3.1 06/07/2011   TROPONINI <0.30 06/07/2011    Lab Results  Component Value Date   CHOL 144 06/06/2011   Lab Results  Component Value Date   HDL 25* 06/06/2011   Lab Results  Component Value Date   LDLCALC 99 06/06/2011   Lab Results  Component Value Date   TRIG 101 06/06/2011   Lab Results  Component Value Date   CHOLHDL 5.8 06/06/2011   No results found for this basename: LDLDIRECT      Radiology: Dg Chest 2 View  06/06/2011  *RADIOLOGY REPORT*  Clinical Data: Shortness of breath, chest pain, cough  CHEST - 2 VIEW  Comparison: None.  Findings: No pneumonia is seen.  However there are somewhat prominent markings bilaterally with peribronchial thickening.  This could indicate bronchitis, but a viral pneumonia would be difficult to exclude.  There is mild cardiomegaly present.  No bony abnormality is seen.  IMPRESSION:  1.  Somewhat prominent interstitial markings with peribronchial thickening.  Possible bronchitis but cannot exclude viral pneumonia. 2.  Cardiomegaly.  Original Report Authenticated By: Juline Patch, M.D.   Ct Angio Chest W/cm &/or Wo Cm  06/07/2011  *RADIOLOGY REPORT*  Clinical Data: Cough with chest pain and shortness of breath. Elevated D-dimer proteinuria with lower extremity swelling.  CT ANGIOGRAPHY CHEST  Technique:  Multidetector CT imaging of the chest using the standard protocol during bolus administration of intravenous contrast. Multiplanar reconstructed images including MIPs were obtained and reviewed to evaluate the vascular anatomy.  Contrast: OMNIPAQUE IOHEXOL 300 MG/ML  SOLN  Comparison: Chest x-ray earlier today.  Findings: Moderate sized right pleural effusion with smaller significant left effusion.  Cardiomegaly with mild to moderate pericardial effusion.  There are no focal infiltrates or masses. The esophagus is normal except for a apparent tail lodged in the distal esophagus.  Mild stranding left lung  base. No visible hilar or mediastinal masses are significant adenopathy.  Good opacification of the pulmonary vasculature and aorta.  No evidence for pulmonary embolic disease.  No significant atherosclerosis of the arch or  great vessels.  Question faint tiny calcification left main coronary artery. Normal thoracic spine and sternum.  No displaced rib fracture.  The Cuts of the upper abdomen are grossly unremarkable.  Compared the prior chest x-ray, the effusions are difficult to visualize.  IMPRESSION: Bilateral pleural effusions, right greater than left along with pericardial effusion.  Cardiomegaly.  No underlying infiltrate, and no evidence for pulmonary emboli. The effusions could be consistent with clinical impression of possible nephrotic syndrome.  Radiopaque density lodged in distal esophagus, likely pill.  Original Report Authenticated By: Elsie Stain, M.D.   US Renal  06/06/2011  *RADIOLOGY REPORT*  Clinical Data: Proteinuria.  RENAL/URINARY TRACT ULTRASOUND COMPLETE  Comparison:  None.  Findings:  Right  Kidney:  Measures 11.2 cm and appears normal without stone, mass or hydronephrosis.  Cortical echogenicity is also normal.  Left Kidney:  Measures 10.6 cm and appears normal without stone, mass or hydronephrosis.  Cortical echogenicity is normal.  Bladder:  Normal appearance.  IMPRESSION: Normal study.  Original Report Authenticated By: Bernadene Bell. D'ALESSIO, M.D.   EKG:  I have reviewed the EKG. There is left bundle branch block with sinus rhythm.  ASSESSMENT AND PLAN:   At this point it is difficult to have 1 consolidating diagnosis. Patient presents with severe anemia, hypokalemia, left bundle branch block, unexplained left ventricular dysfunction, volume overload. We will have to address each of the issues individually.   Hypertension   Patient presented with severe Hypertension. She says that she was on only a beta blocker before. It is possible that she could have hypertensive  cardiovascular disease. However her echo shows focal abnormalities. Some of this is related to the bundle-branch block. However I cannot rule out coronary disease. We will need to follow her blood pressure.   Systolic CHF    The patient has systolic congestive heart failure that is acute. She needs to be diuresis. In addition medications he did be added as tolerated for her left ventricular dysfunction.   Anemia   Etiology of the anemia is not clear at this point. It is possible that some of her CHF could be high output failure leading to left ventricular dysfunction. However focal wall motion abnormalities seem to argue against this.   LBBB (left bundle branch block) We do not know how long she's had left bundle branch block.   Cardiomyopathy    Patient's ejection fraction is in the range of 30%. There is left bundle branch block. There is septal dyssynergy that could be related to the left bundle. However in addition there appears to be severe hypokinesis of the inferior wall and severe hypokinesis of the anterior wall. The inferolateral wall and lateral walls move better than the other walls. If tolerated she needs to be on a beta blocker and ACE inhibitor. She needs to be diuresed. She also needs to undergo cardiac catheterization this admission so that we can better understand her overall status. I did explain to the patient that we would want to proceed with cardiac catheterization during this admission. If the etiology of the patient's cardiomyopathy does not become clearer over time, especially after the catheterization, I would consult the advanced heart failure team to help assess this young woman.  Hypokalemia   Etiology of presenting with hypokalemia is not clear to me. I suspect a consideration has to be given to ruling out primary hyperaldosteronism.  However, I'm not sure how anemia would be related to this. Her potassium needs to be treated and normalized.  Jerral Bonito, MD

## 2011-06-07 NOTE — Progress Notes (Signed)
[facility] progress note. Chief complaint. Transfer note. History of present illness. 31 year old female thin in hospital at [facility] long with dyspnea on exertion. She is noted do with a bilateral lower extremity edema thought secondary to systolic CHF. Also noted in hospital next elevated hypertension with blood pressure medications being adjusted along with diuresis for the congestive heart failure. Also anemia present requiring transfusion packed red blood cells. Etiology of anemia is yet unclear. Patient transferred to [facility] today for further investigation and treatment. Cardiology is following along seen by Dr. Myrtis Ser earlier today. I'm seeing the patient at bedside to ensure her stability post transfer and ensure per orders transferred appropriately. Vital signs. Temperature 97.5, pulse 90, respiration 14, blood pressure 131/91. O2 sats 100%. General appearance. Well-developed female in no distress. Alert, oriented, cooperative. Cardiac. Rate and rhythm regular. No jugular venous distention. She does have 2+ pedal edema extending to at least the knee bilaterally. Lungs. Breath sounds are clear and equal bilaterally. No crackles appreciated. Abdomen. Soft with positive bowel sounds. No pain. Impression/plan. Problem #1 congestive heart failure. Patient will continue with a Lasix diuresis. Cardiology is following as well. Problem #2 accelerated hypertension. We'll follow along blood pressure on current medications. Problem #3 Anemia. We'll follow with a CBC with a.m. labs. Patient clinically stable per bedside exam. All orders appear to have transferred appropriately.

## 2011-06-07 NOTE — Significant Event (Signed)
Discussed with Dr. Myrtis Ser and he is in agreement to transfer pt to [facility] for further evaluation and management.  Debbora Presto [facility], pager #: [phone] Main office number: [phone]

## 2011-06-07 NOTE — Progress Notes (Signed)
PA on call L. [place] assessed pt due to complaints of SOB. PA decided that pt should not receive 2nd unit of blood until cardiologist sees the pt today to assess further.  Will continue to monitor pt.

## 2011-06-07 NOTE — Plan of Care (Signed)
Problem: Phase I Progression Outcomes Goal: EF % per last Echo/documented,Core Reminder form on chart Outcome: Completed/Met Date Met:  06/07/11 Echo 15%

## 2011-06-07 NOTE — Progress Notes (Addendum)
Patient ID: Laurie Wade, female   DOB: Jun 15, 1980, 31 y.o.   MRN: [id number]  Subjective: No events overnight. Patient reports persistent and mostly exertional shortness of breath that is slightly worse since yesterday.  Objective:  Vital signs in last 24 hours:  Filed Vitals:   06/07/11 0330 06/07/11 0430 06/07/11 0500 06/07/11 0514  BP: 163/120 163/120 156/118 163/120  Pulse: 94 95 95 98  Temp: 97.9 F (36.6 C) 97.7 F (36.5 C) 97.9 F (36.6 C) 96.9 F (36.1 C)  Resp: 26 20 24 20   SpO2:    99%   Intake/Output from previous day:   Intake/Output Summary (Last 24 hours) at 06/07/11 0920 Last data filed at 06/07/11 1191  Gross per 24 hour  Intake 833.75 ml  Output   1125 ml  Net -291.25 ml    Physical Exam: General: Alert, awake, oriented x3, in no acute distress. HEENT: No bruits, no goiter. Moist mucous membranes, no scleral icterus, no conjunctival pallor. Heart: Regular rate and rhythm, S1/S2 +, no murmurs, rubs, gallops. Lungs: Clear to auscultation bilaterally with bibasilar crackles. No wheezing, no rhonchi, no rales.  Abdomen: Soft, nontender, nondistended, positive bowel sounds. Extremities: No clubbing or cyanosis, bilateral upper and lower extremity pitting edema slightly worse compared to yesterday,  positive pedal pulses. Neuro: Grossly nonfocal.  Lab Results:  Lab 06/07/11 0750 06/06/11 0840  WBC 8.4 6.3  HGB 9.3* 7.6*  HCT 31.7* 26.9*  PLT 293 227   Lab 06/07/11 0750 06/06/11 1615 06/06/11 0840  K 3.7 2.9* 2.3*  CL 102 104 103  CO2 20 24 26   GLUCOSE 115* 121* 109*  BUN 13 14 13   CREATININE 0.61 0.79 0.84  CALCIUM 8.7 8.7 8.7  MG 1.8 1.8 --   Cardiac markers:  Lab 06/07/11 0750 06/06/11 2355 06/06/11 1613  CKMB 3.1 3.3 3.6  TROPONINI <0.30 <0.30 <0.30  MYOGLOBIN -- -- --    Studies/Results:  Dg Chest 2 View 06/06/2011    IMPRESSION:   1.  Somewhat prominent interstitial markings with peribronchial thickening.  Possible bronchitis but  cannot exclude viral pneumonia.  2.  Cardiomegaly.   Ct Angio Chest W/cm &/or Wo Cm 06/07/2011    IMPRESSION:  Bilateral pleural effusions, right greater than left along with pericardial effusion.   Cardiomegaly.  No underlying infiltrate, and no evidence for pulmonary emboli.  The effusions could be consistent with clinical impression of possible nephrotic syndrome.  Radiopaque density lodged in distal esophagus, likely pill.    US Renal 06/06/2011     IMPRESSION:  Normal study.    Medications: Scheduled Meds:   . enoxaparin  40 mg Subcutaneous Q24H  . furosemide  40 mg Intravenous BID  . labetalol  100 mg Oral BID  .  morphine injection  4 mg Intravenous Once  . moxifloxacin  400 mg Intravenous q1800  . pantoprazole  IV  40 mg Intravenous Daily  . potassium chloride  40 mEq Oral Once  . potassium chloride  40 mEq Oral Once  . potassium chloride  40 mEq Oral TID   Continuous Infusions:   . DISCONTD: sodium chloride 75 mL/hr (06/06/11 1248)   PRN Meds:.hydrALAZINE, HYDROcodone-acetaminophen, iohexol, morphine injection, ondansetron (ZOFRAN) IV, ondansetron, DISCONTD: hydrALAZINE  Assessment/Plan:  Assessment/Plan  Lower extremity swelling  - unclear etiology at this time but given degree of proteinuria which is rather small I am not sure this can be contributed to nephrotic syndrom - ANA and complement levels ar within normal limits - at this  point as swelling is worse today will proceed with Lasix 40 mg IV and will start with BID dosing - will need to readjust the regimen as indicated - cardiology consult obtained  Acute respiratory failure of unclear etiology, ? Diastolic/Systolic CHF  - unclear etiology but per CXR noted cardiomegaly and ? PNA, ? Viral cardiomyopathy  - d-dimer elevated, CT angio confirmed bilateral pleural effusions with cardiomegaly, no PE - ordered 2 D ECHO and this is still pending - BNP is elevated and with other clinical symptoms suggestive of  volume overload will go ahead and proceed with Lasix IV - continue to follow up on strict I's and O's  HTN, accelaerated  - accelerated and unclear etiology  - will start Labetalol 100 mg BID PO, hydralazine PRN, Lasix 40 mg IV BID   Anemia  - consistent with iron deficiency - Hg and Hct now improved since yesterday and we can hold off on second blood unit transfusion - initiate Iron supplementation - CBC in AM   Hypokalemia  - magnesium is within normal limits  - this Am potassium is within normal limits - BMP in AM  Proteinuria  - unclear etiology, mild degree in the setting on normal kidney function tests, creatinine and GFR  - current autoimmune panel is unremarkable for specific diagnosis - Renal US is also unremarkable for acute events - spoke with Dr. Darrick Penna and he had agreed with autoimmune work up which is unremarkable so far - pt is maintaining good urine output  Cardiomegaly  - noted on CXR and confirmed with CT chest - CE's to this point are  Within normal limits - 2 D ECHO to be done today and will follow up results - cardiology consult obtained  DVT Prophylaxis - Lovenox   Code Status - Full   Education  - test results and diagnostic studies were discussed with patient  - patient verbalized the understanding  - questions were answered at the bedside and contact information was provided for additional questions or concerns    LOS: 1 day   MAGICK-Jaedon Siler 06/07/2011, 9:20 AM  [facility] Pager: [phone]

## 2011-06-07 NOTE — Progress Notes (Signed)
Report called to RN on 4700. [facility] picked up pt at 1445. K. Vear Clock RN

## 2011-06-07 NOTE — Progress Notes (Addendum)
  Asked by hospitalist service to evaluate patient for new-onset HF.   Chart reviewed closely including Dr. Henrietta Hoover note from earlier today. Echo images reviewed personally. Patient interviewed at length ad examined with her daughter present.   She is a 31 y/o woman with h/o severe uncontrolled HTN. Previously on Atenolol but she stopped this a while back. Denies any other medical problems except for migraines. Does note that she has heavy menstrual periods that used to last 3-4 days but more recently can last up to 10 days. Family history notable for significant in both her mother and grandmother in their 65-50s.   Developed progressive dyspnea and LE edema over past few weeks. No evidence of viral infection. No CP. No recent travel, rash or arthralgias. Says SBP often over 200.  On admit, BP 179/108. 2+ edema. Hgb 7.6. MCV 78 with low iron. UA with 30mg /dl protein. pBNP 3288. ECG with LBBB. Echo EF 15% with regional wall motion abnormalities and restrictive filling pattern. ANA/ANCA negative. Renal u/s normal.  On exam JVP about 9. +s3 1-2+ edema.   Impression:   Ms. 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 That said, there is a very significant FHx of premature CAD in both her mother and maternal grandmother and she will need a R and L heart cath on Monday with renal artery angiography to exclude an ischemic component as well as renal artery stenosis or FMD of the renals. Her proteinuria is most likely sequelae of her severe HTN. She also has iron deficiency anemia which appears to be due to her heavy menstrual cycles and she would likely benefit from a gyn evaluation and possible hormonal therapy. She will need careful contraception (?IUD) to avoid becoming pregnant while her heart is so weak.  Recs:  1) Continue diuresis with lasix 40 iv bid 2) Titrate ramipril and carvedilol as tolerated to get SBP 120-140 (would not push below this  for now) 3) Add spironolactone 4) Can add digoxin as needed 5) R/L cath with renal angio on Monday (scheduled) 6) Continue iron supplementation 7) Check FOBT 8) Gyn consult to help with contraception and management of heavy menstrual cycles  The HF team will follow.   Truman Hayward 6:44 PM

## 2011-06-07 NOTE — Progress Notes (Signed)
*  PRELIMINARY RESULTS* Echocardiogram 2D Echocardiogram has been performed.  Laurie Wade [facility] 06/07/2011, 10:30 AM

## 2011-06-08 DIAGNOSIS — I10 Essential (primary) hypertension: Secondary | ICD-10-CM

## 2011-06-08 DIAGNOSIS — R809 Proteinuria, unspecified: Secondary | ICD-10-CM

## 2011-06-08 DIAGNOSIS — M7989 Other specified soft tissue disorders: Secondary | ICD-10-CM

## 2011-06-08 DIAGNOSIS — I447 Left bundle-branch block, unspecified: Secondary | ICD-10-CM

## 2011-06-08 DIAGNOSIS — E876 Hypokalemia: Secondary | ICD-10-CM

## 2011-06-08 LAB — BASIC METABOLIC PANEL
Chloride: 106 mEq/L (ref 96–112)
GFR calc Af Amer: >90 mL/min (ref >90)
GFR calc non Af Amer: >90 mL/min (ref >90)
Potassium: 2.9 mEq/L — ABNORMAL LOW (ref 3.5–5.1)
Sodium: 142 mEq/L (ref 135–145)

## 2011-06-08 LAB — CBC
Hemoglobin: 8.2 g/dL — ABNORMAL LOW (ref 12.0–15.0)
MCHC: 28.9 g/dL — ABNORMAL LOW (ref 30.0–36.0)
RDW: 18.2 % — ABNORMAL HIGH (ref 11.5–15.5)
WBC: 6.5 10*3/uL (ref 4.0–10.5)

## 2011-06-08 LAB — PROTEIN, URINE, 24 HOUR: Protein, Urine: 5 mg/dL

## 2011-06-08 MED ORDER — POTASSIUM CHLORIDE CRYS ER 20 MEQ PO TBCR
40.0000 meq | EXTENDED_RELEASE_TABLET | Freq: Three times a day (TID) | ORAL | Status: DC
  Administered 2011-06-08 (×3): 40 meq via ORAL
  Filled 2011-06-08 (×6): qty 2

## 2011-06-08 MED ORDER — POTASSIUM CHLORIDE 10 MEQ/100ML IV SOLN
10.0000 meq | INTRAVENOUS | Status: AC
  Administered 2011-06-08 (×2): 10 meq via INTRAVENOUS
  Filled 2011-06-08 (×2): qty 100

## 2011-06-08 MED ORDER — FUROSEMIDE 40 MG PO TABS
40.0000 mg | ORAL_TABLET | Freq: Two times a day (BID) | ORAL | Status: DC
  Administered 2011-06-08 – 2011-06-09 (×3): 40 mg via ORAL
  Filled 2011-06-08 (×6): qty 1

## 2011-06-08 NOTE — Progress Notes (Signed)
Subjective: No new complaints.   Objective: Weight change: -0.947 kg (-2 lb 1.4 oz)  Intake/Output Summary (Last 24 hours) at 06/08/11 1651 Last data filed at 06/08/11 1246  Gross per 24 hour  Intake    938 ml  Output   1150 ml  Net   -212 ml    Filed Vitals:   06/08/11 1336  BP: 124/87  Pulse: 93  Temp: 97.5 F (36.4 C)  Resp: 19   General: No distress, comfortable sitting up and eating supper Neuro: no deficits HEENT:PERLA Neck: Supple  Cardiovascular: RRR, no murmurs  Lungs: CTAB  Abdomen: Soft, non tender, bowel sounds present  Musculoskeletal: TRACE edema ,  Skin: Intact   Lab Results: Results for orders placed during the hospital encounter of 06/06/11 (from the past 24 hour(s))  PROTEIN, URINE, 24 HOUR     Status: Abnormal   Collection Time   06/07/11  9:30 PM      Component Value Range   Urine Total Volume-UPROT 2850     Collection Interval-UPROT 24     Protein, Urine 5     Protein, 24H Urine 143 (*) 50 - 100 (mg/day)  CBC     Status: Abnormal   Collection Time   06/08/11  5:50 AM      Component Value Range   WBC 6.5  4.0 - 10.5 (K/uL)   RBC 3.58 (*) 3.87 - 5.11 (MIL/uL)   Hemoglobin 8.2 (*) 12.0 - 15.0 (g/dL)   HCT 04.5 (*) 40.9 - 46.0 (%)   MCV 79.3  78.0 - 100.0 (fL)   MCH 22.9 (*) 26.0 - 34.0 (pg)   MCHC 28.9 (*) 30.0 - 36.0 (g/dL)   RDW 81.1 (*) 91.4 - 15.5 (%)   Platelets 224  150 - 400 (K/uL)  BASIC METABOLIC PANEL     Status: Abnormal   Collection Time   06/08/11  5:50 AM      Component Value Range   Sodium 142  135 - 145 (mEq/L)   Potassium 2.9 (*) 3.5 - 5.1 (mEq/L)   Chloride 106  96 - 112 (mEq/L)   CO2 23  19 - 32 (mEq/L)   Glucose, Bld 92  70 - 99 (mg/dL)   BUN 13  6 - 23 (mg/dL)   Creatinine, Ser 7.82  0.50 - 1.10 (mg/dL)   Calcium 8.8  8.4 - 95.6 (mg/dL)   GFR calc non Af Amer >90  >90 (mL/min)   GFR calc Af Amer >90  >90 (mL/min)       Micro Results: No results found for this or any previous visit (from the past 240  hour(s)).  Studies/Results: Dg Chest 2 View  06/06/2011  *RADIOLOGY REPORT*  Clinical Data: Shortness of breath, chest pain, cough  CHEST - 2 VIEW  Comparison: None.  Findings: No pneumonia is seen.  However there are somewhat prominent markings bilaterally with peribronchial thickening.  This could indicate bronchitis, but a viral pneumonia would be difficult to exclude.  There is mild cardiomegaly present.  No bony abnormality is seen.  IMPRESSION:  1.  Somewhat prominent interstitial markings with peribronchial thickening.  Possible bronchitis but cannot exclude viral pneumonia. 2.  Cardiomegaly.  Original Report Authenticated By: Juline Patch, M.D.   Ct Angio Chest W/cm &/or Wo Cm  06/07/2011  *RADIOLOGY REPORT*  Clinical Data: Cough with chest pain and shortness of breath. Elevated D-dimer proteinuria with lower extremity swelling.  CT ANGIOGRAPHY CHEST  Technique:  Multidetector CT imaging of the  chest using the standard protocol during bolus administration of intravenous contrast. Multiplanar reconstructed images including MIPs were obtained and reviewed to evaluate the vascular anatomy.  Contrast: OMNIPAQUE IOHEXOL 300 MG/ML  SOLN  Comparison: Chest x-ray earlier today.  Findings: Moderate sized right pleural effusion with smaller significant left effusion.  Cardiomegaly with mild to moderate pericardial effusion.  There are no focal infiltrates or masses. The esophagus is normal except for a apparent tail lodged in the distal esophagus.  Mild stranding left lung base. No visible hilar or mediastinal masses are significant adenopathy.  Good opacification of the pulmonary vasculature and aorta.  No evidence for pulmonary embolic disease.  No significant atherosclerosis of the arch or  great vessels.  Question faint tiny calcification left main coronary artery. Normal thoracic spine and sternum.  No displaced rib fracture.  The Cuts of the upper abdomen are grossly unremarkable.  Compared the  prior chest x-ray, the effusions are difficult to visualize.  IMPRESSION: Bilateral pleural effusions, right greater than left along with pericardial effusion.  Cardiomegaly.  No underlying infiltrate, and no evidence for pulmonary emboli. The effusions could be consistent with clinical impression of possible nephrotic syndrome.  Radiopaque density lodged in distal esophagus, likely pill.  Original Report Authenticated By: Elsie Stain, M.D.   US Renal  06/06/2011  *RADIOLOGY REPORT*  Clinical Data: Proteinuria.  RENAL/URINARY TRACT ULTRASOUND COMPLETE  Comparison:  None.  Findings:  Right Kidney:  Measures 11.2 cm and appears normal without stone, mass or hydronephrosis.  Cortical echogenicity is also normal.  Left Kidney:  Measures 10.6 cm and appears normal without stone, mass or hydronephrosis.  Cortical echogenicity is normal.  Bladder:  Normal appearance.  IMPRESSION: Normal study.  Original Report Authenticated By: Bernadene Bell. Maricela Curet, M.D.   Medications: Scheduled Meds:   . aspirin  324 mg Oral Pre-Cath  . carvedilol  6.25 mg Oral BID WC  . diazepam  5 mg Oral On Call  . enoxaparin  40 mg Subcutaneous Q24H  . ferrous sulfate  325 mg Oral BID WC  . furosemide  40 mg Oral BID  . moxifloxacin  400 mg Intravenous q1800  . pantoprazole (PROTONIX) IV  40 mg Intravenous Daily  . potassium chloride  10 mEq Intravenous Q1 Hr x 2  . potassium chloride  40 mEq Oral TID  . ramipril  5 mg Oral Daily  . sodium chloride  3 mL Intravenous Q12H  . spironolactone  12.5 mg Oral Daily  . DISCONTD: furosemide  40 mg Intravenous BID  . DISCONTD: potassium chloride  40 mEq Oral Daily   Continuous Infusions:  PRN Meds:.sodium chloride, hydrALAZINE, HYDROcodone-acetaminophen, morphine injection, ondansetron (ZOFRAN) IV, ondansetron, sodium chloride  Assessment/Plan: Assessment/Plan  Lower extremity swelling  - unclear etiology at this time but given degree of proteinuria which is rather small I am not  sure this can be contributed to nephrotic syndrom  - ANA and complement levels ar within normal limits  - cardiology consult obtained recommended transfer to [facility] for possible Cath on Monday for evaluation of Cardiomyopathy contributing to the swelling.  Acute respiratory failure of unclear etiology, ? Diastolic/Systolic CHF  - unclear etiology but per CXR noted cardiomegaly and ? PNA, ? Viral cardiomyopathy  - d-dimer elevated, CT angio confirmed bilateral pleural effusions with cardiomegaly, no PE - ordered 2 D ECHO showing LVEF of 15% and akinesis of the anteroseptal and apical myocardium. And consistent with hight ventricular filling pressure.  - BNP is elevated and  with other clinical symptoms suggestive of volume overload will go ahead and proceed with Lasix IV  - strict I's and O's  Daily weights - continue with lasix po BID  -Continue with coreg, aspirin, and altace.  HTN, accelaerated  - better controlled.   Anemia  - consistent with iron deficiency  - CBC in AM  Hypokalemia  - replete as needed  - BMP in AM  Proteinuria  - unclear etiology, mild degree in the setting on normal kidney function tests, creatinine and GFR  - current autoimmune panel is unremarkable for specific diagnosis  - Renal US is also unremarkable for acute events  - spoke with Dr. Darrick Penna and he had agreed with autoimmune work up which is unremarkable so far  - pt is maintaining good urine output  Cardiomegaly  - noted on CXR and confirmed with CT chest  - CE's to this point are Within normal limits  - 2 D ECHO to be done today and will follow up results  - cardiology consult obtained  DVT Prophylaxis - Lovenox  Code Status - Full      LOS: 2 days   Keilee Denman 06/08/2011, 4:51 PM

## 2011-06-08 NOTE — Progress Notes (Signed)
PROGRESS NOTE  Subjective:   31 yo lady admitted with progressive dyspnea.  Also found to have LBBB, anemia. D-dimer was found to be elevated - subsequent CT angio of chest was negative for PE.  She feels about the same as yesterday. C/o sore legs She has diuresed about 3 liters.  K is low.    Objective:    Vital Signs:   Temp:  [97.2 F (36.2 C)-97.8 F (36.6 C)] 97.2 F (36.2 C) (06/01 0540) Pulse Rate:  [80-91] 81  (06/01 0540) Resp:  [14-20] 20  (06/01 0540) BP: (99-144)/(64-103) 123/82 mmHg (06/01 0540) SpO2:  [95 %-100 %] 97 % (06/01 0540) Weight:  [185 lb 3 oz (84 kg)-187 lb 9.8 oz (85.1 kg)] 185 lb 3 oz (84 kg) (06/01 0540)  Last BM Date: 06/05/11   24-hour weight change: Weight change: -2 lb 1.4 oz (-0.947 kg)  Weight trends: Filed Weights   06/06/11 2058 06/07/11 1557 06/08/11 0540  Weight: 189 lb 11.2 oz (86.047 kg) 187 lb 9.8 oz (85.1 kg) 185 lb 3 oz (84 kg)    Intake/Output:  05/31 0701 - 06/01 0700 In: 618 [P.O.:360; IV Piggyback:258] Out: 3100 [Urine:3100]     Physical Exam: BP 123/82  Pulse 81  Temp(Src) 97.2 F (36.2 C) (Oral)  Resp 20  Ht 5\' 6"  (1.676 m)  Wt 185 lb 3 oz (84 kg)  BMI 29.89 kg/m2  SpO2 97%  LMP 05/08/2011  General: Vital signs reviewed and noted. Well-developed, well-nourished, in no acute distress; alert, appropriate and cooperative throughout examination.  Head: Normocephalic, atraumatic.  Eyes: conjunctivae/corneas clear. PERRL, EOM's intact. Fundi benign.  Throat: normal  Neck: Supple. Normal carotids. No JVD  Lungs:  Clear bilaterally to auscultation   Heart: Regular rate,  With normal  S1 S2. Soft S3.  Abdomen:  Soft, non-tender, non-distended with normoactive bowel sounds. No hepatomegaly. No rebound/guarding. No abdominal masses.  Extremities: Distal pedal pulses are 2+ .  No edema.    Neurologic: A&O X3, CN II - XII are grossly intact. Motor strength is 5/5 in the all 4 extremities.  Psych: Responds to  questions appropriately with normal affect.    Labs: BMET:  Basename 06/08/11 0550 06/07/11 0750 06/06/11 1325  NA 142 134* --  K 2.9* 3.7 --  CL 106 102 --  CO2 23 20 --  GLUCOSE 92 115* --  BUN 13 13 --  CREATININE 0.78 0.61 --  CALCIUM 8.8 8.7 --  MG -- -- 1.8  PHOS -- -- 4.4    Liver function tests:  Basename 06/06/11 1615 06/06/11 0840  AST 13 11  ALT 16 15  ALKPHOS 54 52  BILITOT 0.4 0.4  PROT 5.8* 5.9*  ALBUMIN 3.3* 3.3*   No results found for this basename: LIPASE:2,AMYLASE:2 in the last 72 hours  CBC:  Basename 06/08/11 0550 06/07/11 0750 06/06/11 0840  WBC 6.5 8.4 --  NEUTROABS -- -- 4.1  HGB 8.2* 9.3* --  HCT 28.4* 31.7* --  MCV 79.3 78.3 --  PLT 224 293 --    Cardiac Enzymes:  Basename 06/07/11 0750 06/06/11 2355 06/06/11 1613  CKTOTAL 38 44 49  CKMB 3.1 3.3 3.6  TROPONINI <0.30 <0.30 <0.30    Coagulation Studies: No results found for this basename: LABPROT:5,INR:5 in the last 72 hours  Other: No components found with this basename: POCBNP:3  Basename 06/06/11 1615  DDIMER 1.74*    Basename 06/06/11 1325  HGBA1C 5.1    Basename 06/06/11 1615  CHOL 144  HDL 25*  LDLCALC 99  TRIG 161  CHOLHDL 5.8    Basename 06/06/11 1325  TSH 2.538  T4TOTAL --  T3FREE --  THYROIDAB --    Basename 06/06/11 1809  VITAMINB12 232  FOLATE 8.6  FERRITIN 11  TIBC 380  IRON 24*  RETICCTPCT 4.2*    Tele:  NSR. LBBB  Medications:    Infusions:    Scheduled Medications:    . aspirin  324 mg Oral Pre-Cath  . carvedilol  6.25 mg Oral BID WC  . diazepam  5 mg Oral On Call  . enoxaparin  40 mg Subcutaneous Q24H  . ferrous sulfate  325 mg Oral BID WC  . furosemide  40 mg Intravenous BID  . moxifloxacin  400 mg Intravenous q1800  . pantoprazole (PROTONIX) IV  40 mg Intravenous Daily  . potassium chloride  40 mEq Oral Daily  . ramipril  5 mg Oral Daily  . sodium chloride  3 mL Intravenous Q12H  . spironolactone  12.5 mg Oral Daily    . DISCONTD: furosemide  40 mg Intravenous Once  . DISCONTD: labetalol  100 mg Oral BID  . DISCONTD: potassium chloride  40 mEq Oral TID    Assessment/ Plan:     Hypertension (06/07/2011)   Much better on medicines.  She has had HTN for years - was not on any meds.   BP is normal today. I suspect this is the cause of her CHF  Systolic CHF (06/07/2011)   She does not have any edema but still has a soft S3. No JVD.  Will decrease Lasix.  Replace K. For R and left cath on Monday.  Anemia (06/07/2011) Plan per medical team  LBBB (left bundle branch block) (06/07/2011)    Disposition: cath monday Length of Stay: 2  Alvia Grove., MD, [facility] 06/08/2011, 8:41 AM Office [phone] Pager [phone]

## 2011-06-09 ENCOUNTER — Inpatient Hospital Stay (HOSPITAL_COMMUNITY): Payer: Medicaid Other

## 2011-06-09 DIAGNOSIS — I43 Cardiomyopathy in diseases classified elsewhere: Secondary | ICD-10-CM

## 2011-06-09 DIAGNOSIS — I5041 Acute combined systolic (congestive) and diastolic (congestive) heart failure: Secondary | ICD-10-CM

## 2011-06-09 DIAGNOSIS — M7989 Other specified soft tissue disorders: Secondary | ICD-10-CM

## 2011-06-09 DIAGNOSIS — I509 Heart failure, unspecified: Secondary | ICD-10-CM

## 2011-06-09 DIAGNOSIS — I517 Cardiomegaly: Secondary | ICD-10-CM

## 2011-06-09 DIAGNOSIS — I10 Essential (primary) hypertension: Secondary | ICD-10-CM

## 2011-06-09 DIAGNOSIS — I110 Hypertensive heart disease with heart failure: Secondary | ICD-10-CM

## 2011-06-09 DIAGNOSIS — D509 Iron deficiency anemia, unspecified: Secondary | ICD-10-CM

## 2011-06-09 LAB — BASIC METABOLIC PANEL
CO2: 22 mEq/L (ref 19–32)
Chloride: 109 mEq/L (ref 96–112)
GFR calc Af Amer: >90 mL/min (ref >90)
Potassium: 3.8 mEq/L (ref 3.5–5.1)

## 2011-06-09 LAB — MAGNESIUM: Magnesium: 1.9 mg/dL (ref 1.5–2.5)

## 2011-06-09 MED ORDER — HYDRALAZINE HCL 20 MG/ML IJ SOLN
10.0000 mg | Freq: Four times a day (QID) | INTRAMUSCULAR | Status: DC | PRN
  Administered 2011-06-10: 10 mg via INTRAVENOUS
  Filled 2011-06-09: qty 0.5

## 2011-06-09 MED ORDER — POTASSIUM CHLORIDE CRYS ER 20 MEQ PO TBCR
40.0000 meq | EXTENDED_RELEASE_TABLET | Freq: Two times a day (BID) | ORAL | Status: DC
  Administered 2011-06-09 – 2011-06-12 (×6): 40 meq via ORAL
  Filled 2011-06-09 (×7): qty 2

## 2011-06-09 MED ORDER — GUAIFENESIN-DM 100-10 MG/5ML PO SYRP: 15.0000 mL | ORAL_SOLUTION | ORAL | Status: DC | PRN

## 2011-06-09 MED ORDER — FUROSEMIDE 80 MG PO TABS
80.0000 mg | ORAL_TABLET | Freq: Two times a day (BID) | ORAL | Status: DC
  Administered 2011-06-09: 80 mg via ORAL
  Filled 2011-06-09 (×4): qty 1

## 2011-06-09 MED ORDER — ALUM & MAG HYDROXIDE-SIMETH 200-200-20 MG/5ML PO SUSP: 15.0000 mL | ORAL | Status: DC | PRN

## 2011-06-09 MED ORDER — LOPERAMIDE HCL 2 MG PO CAPS: 4.0000 mg | ORAL_CAPSULE | Freq: Once | ORAL | Status: AC | PRN

## 2011-06-09 MED ORDER — MAGNESIUM HYDROXIDE 400 MG/5ML PO SUSP
30.0000 mL | Freq: Every day | ORAL | Status: DC | PRN
  Administered 2011-06-09: 30 mL via ORAL
  Filled 2011-06-09: qty 30

## 2011-06-09 MED ORDER — DOCUSATE SODIUM 100 MG PO CAPS
100.0000 mg | ORAL_CAPSULE | Freq: Two times a day (BID) | ORAL | Status: DC | PRN
  Filled 2011-06-09: qty 1

## 2011-06-09 MED ORDER — SPIRONOLACTONE 25 MG PO TABS
25.0000 mg | ORAL_TABLET | Freq: Every day | ORAL | Status: DC
  Administered 2011-06-09: 25 mg via ORAL
  Filled 2011-06-09 (×3): qty 1

## 2011-06-09 MED ORDER — TRAMADOL-ACETAMINOPHEN 37.5-325 MG PO TABS
1.0000 | ORAL_TABLET | Freq: Four times a day (QID) | ORAL | Status: DC | PRN
  Filled 2011-06-09: qty 2

## 2011-06-09 MED ORDER — PRAMOXINE-ZINC OXIDE IN MO 1-12.5 % RE OINT
1.0000 "application " | TOPICAL_OINTMENT | Freq: Three times a day (TID) | RECTAL | Status: DC | PRN
  Filled 2011-06-09: qty 28.3

## 2011-06-09 NOTE — Progress Notes (Addendum)
Advanced Heart Failure Rounding Note   Subjective:    Feels OK. Ambulating room. Had migraine last night. No orthopnea or PND. I/O not recorded for this am. Weight going back up. BP up.   C/o pain in left ankle. Taking NARCO every 4 hours. No BM since Wednesday.  Objective:   Weight Range:  Vital Signs:   Temp:  [97.5 F (36.4 C)-98.7 F (37.1 C)] 98.7 F (37.1 C) (06/02 0724) Pulse Rate:  [81-100] 100  (06/02 0724) Resp:  [16-19] 16  (06/02 0724) BP: (124-156)/(87-117) 156/117 mmHg (06/02 0724) SpO2:  [100 %] 100 % (06/02 0724) Weight:  [85.4 kg (188 lb 4.4 oz)] 85.4 kg (188 lb 4.4 oz) (06/02 0724) Last BM Date: 07/06/11  Weight change: Filed Weights   06/07/11 1557 06/08/11 0540 06/09/11 0724  Weight: 85.1 kg (187 lb 9.8 oz) 84 kg (185 lb 3 oz) 85.4 kg (188 lb 4.4 oz)    Intake/Output:   Intake/Output Summary (Last 24 hours) at 06/09/11 0854 Last data filed at 06/09/11 0803  Gross per 24 hour  Intake    920 ml  Output      0 ml  Net    920 ml     Physical Exam: General:  Well appearing. No resp difficulty HEENT: normal Neck: supple. JVP 8 . Carotids 2+ bilat; no bruits. No lymphadenopathy or thryomegaly appreciated. Cor: PMI nondisplaced. Regular rate & rhythm. +s3 Lungs: clear Abdomen: soft, nontender, nondistended. No hepatosplenomegaly. No bruits or masses. Good bowel sounds. Extremities: no cyanosis, clubbing, rash, tr-1+edema Neuro: alert & orientedx3, cranial nerves grossly intact. moves all 4 extremities w/o difficulty. Flat affect.   Telemetry:  SR 95-110  Labs: Basic Metabolic Panel:  Lab 06/09/11 0640 06/08/11 0550 06/07/11 0750 06/06/11 1615 06/06/11 1325 06/06/11 0840  NA 143 142 134* 139 -- 139  K 3.8 2.9* 3.7 2.9* -- 2.3*  CL 109 106 102 104 -- 103  CO2 22 23 20 24 -- 26  GLUCOSE 94 92 115* 121* -- 109*  BUN 15 13 13 14 -- 13  CREATININE 0.78 0.78 0.61 0.79 -- 0.84  CALCIUM 9.0 8.8 8.7 -- -- --  MG 1.9 -- -- -- 1.8 --  PHOS -- -- -- --  4.4 --    Liver Function Tests:  Lab 06/06/11 1615 06/06/11 0840  AST 13 11  ALT 16 15  ALKPHOS 54 52  BILITOT 0.4 0.4  PROT 5.8* 5.9*  ALBUMIN 3.3* 3.3*   No results found for this basename: LIPASE:5,AMYLASE:5 in the last 168 hours No results found for this basename: AMMONIA:3 in the last 168 hours  CBC:  Lab 06/08/11 0550 06/07/11 0750 06/06/11 0840  WBC 6.5 8.4 6.3  NEUTROABS -- -- 4.1  HGB 8.2* 9.3* 7.6*  HCT 28.4* 31.7* 26.9*  MCV 79.3 78.3 78.7  PLT 224 293 227    Cardiac Enzymes:  Lab 06/07/11 0750 06/06/11 2355 06/06/11 1613  CKTOTAL 38 44 49  CKMB 3.1 3.3 3.6  CKMBINDEX -- -- --  TROPONINI <0.30 <0.30 <0.30    BNP: BNP (last 3 results)  Basename 06/06/11 1830  PROBNP 3288.0*      Imaging:  No results found.   Medications:     Scheduled Medications:    . aspirin  324 mg Oral Pre-Cath  . carvedilol  6.25 mg Oral BID WC  . diazepam  5 mg Oral On Call  . enoxaparin  40 mg Subcutaneous Q24H  . ferrous sulfate  325 mg   Oral BID WC  . furosemide  40 mg Oral BID  . moxifloxacin  400 mg Intravenous q1800  . pantoprazole (PROTONIX) IV  40 mg Intravenous Daily  . potassium chloride  10 mEq Intravenous Q1 Hr x 2  . potassium chloride  40 mEq Oral TID  . ramipril  5 mg Oral Daily  . sodium chloride  3 mL Intravenous Q12H  . spironolactone  12.5 mg Oral Daily     Infusions:     PRN Medications:  sodium chloride, hydrALAZINE, HYDROcodone-acetaminophen, morphine injection, ondansetron (ZOFRAN) IV, ondansetron, sodium chloride   Assessment:   1. 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 Severe HTN with associated HF 3. Iron-def anemia, suspect due to heavy menses 4. Migraine HAs   Plan/Discussion:    Improving but still with some volume on board. Will increase lasix to 80 po bid. Increase spiro 25 daily.  Plan cath tomorrow.  BP up slightly this am. Hopefully will improve with increased lasix and  spiro. Can titrate other meds as needed but don't want BP < 120 for now.  Will check uric acid to exclude gout as cause of L ankle pain. Hopefully primary team can wean narcotics.  Needs stool softener with iron supplementation. Will add colace. Give laxative today.  She has had tubal ligation previously but this is not sufficient contraception in setting of her severe HF and teratogenic risk of HF meds to possible fetus. Will need gyn to see soon regarding her heavy menstrual periods and to discuss possible IUD. Will ask primary team to help arrange.  Length of Stay: 3 Doren Kaspar 06/09/2011, 8:54 AM    

## 2011-06-09 NOTE — Progress Notes (Signed)
Subjective: Worried about the procedure tomorrow.   Objective: Weight change:   Intake/Output Summary (Last 24 hours) at 06/09/11 1151 Last data filed at 06/09/11 0803  Gross per 24 hour  Intake    720 ml  Output      0 ml  Net    720 ml    Filed Vitals:   06/09/11 0724  BP: 156/117  Pulse: 100  Temp: 98.7 F (37.1 C)  Resp: 16   General: No distress, comfortable sitting up  Neuro: no deficits  HEENT:PERLA  Neck: Supple  Cardiovascular: RRR, no murmurs  Lungs: CTAB  Abdomen: Soft, non tender, bowel sounds present  Musculoskeletal: TRACE edema ,  Skin: Intact   Lab Results: Results for orders placed during the hospital encounter of 06/06/11 (from the past 24 hour(s))  BASIC METABOLIC PANEL     Status: Normal   Collection Time   06/09/11  6:40 AM      Component Value Range   Sodium 143  135 - 145 (mEq/L)   Potassium 3.8  3.5 - 5.1 (mEq/L)   Chloride 109  96 - 112 (mEq/L)   CO2 22  19 - 32 (mEq/L)   Glucose, Bld 94  70 - 99 (mg/dL)   BUN 15  6 - 23 (mg/dL)   Creatinine, Ser 4.09  0.50 - 1.10 (mg/dL)   Calcium 9.0  8.4 - 81.1 (mg/dL)   GFR calc non Af Amer >90  >90 (mL/min)   GFR calc Af Amer >90  >90 (mL/min)  MAGNESIUM     Status: Normal   Collection Time   06/09/11  6:40 AM      Component Value Range   Magnesium 1.9  1.5 - 2.5 (mg/dL)     Micro Results: No results found for this or any previous visit (from the past 240 hour(s)).  Studies/Results: Dg Chest 2 View  06/06/2011  *RADIOLOGY REPORT*  Clinical Data: Shortness of breath, chest pain, cough  CHEST - 2 VIEW  Comparison: None.  Findings: No pneumonia is seen.  However there are somewhat prominent markings bilaterally with peribronchial thickening.  This could indicate bronchitis, but a viral pneumonia would be difficult to exclude.  There is mild cardiomegaly present.  No bony abnormality is seen.  IMPRESSION:  1.  Somewhat prominent interstitial markings with peribronchial thickening.  Possible  bronchitis but cannot exclude viral pneumonia. 2.  Cardiomegaly.  Original Report Authenticated By: Juline Patch, M.D.   Ct Angio Chest W/cm &/or Wo Cm  06/07/2011  *RADIOLOGY REPORT*  Clinical Data: Cough with chest pain and shortness of breath. Elevated D-dimer proteinuria with lower extremity swelling.  CT ANGIOGRAPHY CHEST  Technique:  Multidetector CT imaging of the chest using the standard protocol during bolus administration of intravenous contrast. Multiplanar reconstructed images including MIPs were obtained and reviewed to evaluate the vascular anatomy.  Contrast: OMNIPAQUE IOHEXOL 300 MG/ML  SOLN  Comparison: Chest x-ray earlier today.  Findings: Moderate sized right pleural effusion with smaller significant left effusion.  Cardiomegaly with mild to moderate pericardial effusion.  There are no focal infiltrates or masses. The esophagus is normal except for a apparent tail lodged in the distal esophagus.  Mild stranding left lung base. No visible hilar or mediastinal masses are significant adenopathy.  Good opacification of the pulmonary vasculature and aorta.  No evidence for pulmonary embolic disease.  No significant atherosclerosis of the arch or  great vessels.  Question faint tiny calcification left main coronary artery. Normal thoracic  spine and sternum.  No displaced rib fracture.  The Cuts of the upper abdomen are grossly unremarkable.  Compared the prior chest x-ray, the effusions are difficult to visualize.  IMPRESSION: Bilateral pleural effusions, right greater than left along with pericardial effusion.  Cardiomegaly.  No underlying infiltrate, and no evidence for pulmonary emboli. The effusions could be consistent with clinical impression of possible nephrotic syndrome.  Radiopaque density lodged in distal esophagus, likely pill.  Original Report Authenticated By: Elsie Stain, M.D.   US Renal  06/06/2011  *RADIOLOGY REPORT*  Clinical Data: Proteinuria.  RENAL/URINARY TRACT  ULTRASOUND COMPLETE  Comparison:  None.  Findings:  Right Kidney:  Measures 11.2 cm and appears normal without stone, mass or hydronephrosis.  Cortical echogenicity is also normal.  Left Kidney:  Measures 10.6 cm and appears normal without stone, mass or hydronephrosis.  Cortical echogenicity is normal.  Bladder:  Normal appearance.  IMPRESSION: Normal study.  Original Report Authenticated By: Bernadene Bell. Maricela Curet, M.D.   Medications: Scheduled Meds:   . aspirin  324 mg Oral Pre-Cath  . carvedilol  6.25 mg Oral BID WC  . diazepam  5 mg Oral On Call  . enoxaparin  40 mg Subcutaneous Q24H  . ferrous sulfate  325 mg Oral BID WC  . furosemide  80 mg Oral BID  . moxifloxacin  400 mg Intravenous q1800  . pantoprazole (PROTONIX) IV  40 mg Intravenous Daily  . potassium chloride  10 mEq Intravenous Q1 Hr x 2  . potassium chloride  40 mEq Oral BID  . ramipril  5 mg Oral Daily  . sodium chloride  3 mL Intravenous Q12H  . spironolactone  25 mg Oral Daily  . DISCONTD: furosemide  40 mg Oral BID  . DISCONTD: potassium chloride  40 mEq Oral TID  . DISCONTD: spironolactone  12.5 mg Oral Daily   Continuous Infusions:  PRN Meds:.sodium chloride, alum & mag hydroxide-simeth, docusate sodium, guaiFENesin-dextromethorphan, hydrALAZINE, HYDROcodone-acetaminophen, loperamide, magnesium hydroxide, morphine injection, ondansetron (ZOFRAN) IV, ondansetron, pramoxine-mineral oil-zinc, sodium chloride  Assessment/Plan: Patient Active Hospital Problem List: Lower extremity swelling  - unclear etiology at this time but given degree of proteinuria which is rather small I am not sure this can be contributed to nephrotic syndrom  - ANA and complement levels ar within normal limits  - cardiology consult obtained recommended transfer to [facility] for possible Cath on Monday for evaluation of Cardiomyopathy contributing to the swelling.  Acute respiratory failure of unclear etiology, ? Diastolic/Systolic CHF  - unclear  etiology but per CXR noted cardiomegaly and ? PNA, ? Viral cardiomyopathy  - d-dimer elevated, CT angio confirmed bilateral pleural effusions with cardiomegaly, no PE - ordered 2 D ECHO showing LVEF of 15% and akinesis of the anteroseptal and apical myocardium. And consistent with hight ventricular filling pressure.  - BNP is elevated and with other clinical symptoms suggestive of volume overload will go ahead and proceed with Lasix IV  -GOOD DIURESIS WITH NEG BALANCE OF 2400 ML Daily weights  - continue with lasix po BID  -Continue with coreg, aspirin, and altace.  HTN, accelaerated  - better controlled.  Anemia  - consistent with iron deficiency  - CBC in AM  Hypokalemia  - replete as needed  - BMP in AM  Proteinuria  - unclear etiology, mild degree in the setting on normal kidney function tests, creatinine and GFR  - current autoimmune panel is unremarkable for specific diagnosis  - Renal US is also unremarkable for acute  events  - spoke with Dr. Darrick Penna and he had agreed with autoimmune work up which is unremarkable so far  - pt is maintaining good urine output  Cardiomegaly  - noted on CXR and confirmed with CT chest  - CE's to this point are Within normal limits  Left and Right heart Cath in am  Left ankle pain: uric acid level ordered by cardiology for evaluation of gout.  X ray of the left ankle.   DVT Prophylaxis - Lovenox  Code Status - Full    LOS: 3 days   Neysha Criado 06/09/2011, 11:51 AM

## 2011-06-10 ENCOUNTER — Encounter (HOSPITAL_COMMUNITY)
Admission: EM | Disposition: A | Discharge status: Home / Self Care | Payer: Self-pay | Source: Home / Self Care | Attending: Internal Medicine

## 2011-06-10 ENCOUNTER — Inpatient Hospital Stay (HOSPITAL_COMMUNITY): Payer: Medicaid Other

## 2011-06-10 ENCOUNTER — Encounter (HOSPITAL_COMMUNITY): Payer: Self-pay | Admitting: Internal Medicine

## 2011-06-10 DIAGNOSIS — M7989 Other specified soft tissue disorders: Secondary | ICD-10-CM

## 2011-06-10 DIAGNOSIS — I10 Essential (primary) hypertension: Secondary | ICD-10-CM

## 2011-06-10 DIAGNOSIS — I43 Cardiomyopathy in diseases classified elsewhere: Secondary | ICD-10-CM

## 2011-06-10 DIAGNOSIS — I517 Cardiomegaly: Secondary | ICD-10-CM

## 2011-06-10 DIAGNOSIS — I509 Heart failure, unspecified: Secondary | ICD-10-CM

## 2011-06-10 HISTORY — PX: LEFT AND RIGHT HEART CATHETERIZATION WITH CORONARY ANGIOGRAM: SHX5449

## 2011-06-10 LAB — POCT I-STAT 3, ART BLOOD GAS (G3+)
Acid-base deficit: 3 mmol/L — ABNORMAL HIGH (ref 0.0–2.0)
Bicarbonate: 21 mEq/L (ref 20.0–24.0)
pO2, Arterial: 93 mmHg (ref 80.0–100.0)

## 2011-06-10 LAB — TYPE AND SCREEN
Unit division: 0
Unit division: 0

## 2011-06-10 LAB — PROTIME-INR
INR: 1.11 (ref 0.00–1.49)
Prothrombin Time: 14.5 seconds (ref 11.6–15.2)

## 2011-06-10 LAB — CBC
HCT: 34 % — ABNORMAL LOW (ref 36.0–46.0)
MCHC: 28.2 g/dL — ABNORMAL LOW (ref 30.0–36.0)
MCV: 80.2 fL (ref 78.0–100.0)
RDW: 19.1 % — ABNORMAL HIGH (ref 11.5–15.5)
WBC: 9.6 10*3/uL (ref 4.0–10.5)

## 2011-06-10 LAB — POCT I-STAT 3, VENOUS BLOOD GAS (G3P V)
Acid-base deficit: 2 mmol/L (ref 0.0–2.0)
Acid-base deficit: 3 mmol/L — ABNORMAL HIGH (ref 0.0–2.0)
Bicarbonate: 23.2 mEq/L (ref 20.0–24.0)
O2 Saturation: 69 %
O2 Saturation: 76 %
TCO2: 22 mmol/L (ref 0–100)
pH, Ven: 7.397 — ABNORMAL HIGH (ref 7.250–7.300)
pH, Ven: 7.396 — ABNORMAL HIGH (ref 7.250–7.300)
pO2, Ven: 35 mmHg (ref 30.0–45.0)
pO2, Ven: 33 mmHg (ref 30.0–45.0)

## 2011-06-10 LAB — CREATININE, SERUM: GFR calc Af Amer: >90 mL/min (ref >90)

## 2011-06-10 LAB — RETICULOCYTES
RBC.: 4.28 MIL/uL (ref 3.87–5.11)
Retic Count, Absolute: 218.3 10*3/uL — ABNORMAL HIGH (ref 19.0–186.0)
Retic Ct Pct: 5.1 % — ABNORMAL HIGH (ref 0.4–3.1)

## 2011-06-10 LAB — URIC ACID: Uric Acid, Serum: 5.2 mg/dL (ref 2.4–7.0)

## 2011-06-10 SURGERY — LEFT AND RIGHT HEART CATHETERIZATION WITH CORONARY ANGIOGRAM
Anesthesia: LOCAL

## 2011-06-10 MED ORDER — MOXIFLOXACIN HCL 400 MG PO TABS
400.0000 mg | ORAL_TABLET | Freq: Every day | ORAL | Status: DC
  Administered 2011-06-10 – 2011-06-11 (×2): 400 mg via ORAL
  Filled 2011-06-10 (×4): qty 1

## 2011-06-10 MED ORDER — CARVEDILOL 3.125 MG PO TABS
9.3750 mg | ORAL_TABLET | Freq: Two times a day (BID) | ORAL | Status: DC
  Filled 2011-06-10 (×2): qty 1

## 2011-06-10 MED ORDER — LIDOCAINE HCL (PF) 1 % IJ SOLN
INTRAMUSCULAR | Status: AC
  Filled 2011-06-10: qty 30

## 2011-06-10 MED ORDER — ENOXAPARIN SODIUM 40 MG/0.4ML ~~LOC~~ SOLN
40.0000 mg | SUBCUTANEOUS | Status: DC
  Administered 2011-06-11 – 2011-06-12 (×2): 40 mg via SUBCUTANEOUS
  Filled 2011-06-10 (×3): qty 0.4

## 2011-06-10 MED ORDER — ONDANSETRON HCL 4 MG/2ML IJ SOLN
4.0000 mg | Freq: Four times a day (QID) | INTRAMUSCULAR | Status: DC | PRN
  Administered 2011-06-10: 4 mg via INTRAVENOUS
  Filled 2011-06-10: qty 2

## 2011-06-10 MED ORDER — HYDROCODONE-ACETAMINOPHEN 5-325 MG PO TABS
ORAL_TABLET | ORAL | Status: AC
  Filled 2011-06-10: qty 2

## 2011-06-10 MED ORDER — HEPARIN (PORCINE) IN NACL 2-0.9 UNIT/ML-% IJ SOLN
INTRAMUSCULAR | Status: AC
  Filled 2011-06-10: qty 2000

## 2011-06-10 MED ORDER — SUMATRIPTAN SUCCINATE 50 MG PO TABS
50.0000 mg | ORAL_TABLET | ORAL | Status: AC | PRN
  Administered 2011-06-10 (×2): 50 mg via ORAL
  Filled 2011-06-10 (×2): qty 1

## 2011-06-10 MED ORDER — HYDRALAZINE HCL 20 MG/ML IJ SOLN
INTRAMUSCULAR | Status: AC
  Filled 2011-06-10: qty 1

## 2011-06-10 MED ORDER — NITROGLYCERIN 0.2 MG/ML ON CALL CATH LAB
INTRAVENOUS | Status: AC
  Filled 2011-06-10: qty 1

## 2011-06-10 MED ORDER — CARVEDILOL 6.25 MG PO TABS
9.3750 mg | ORAL_TABLET | Freq: Two times a day (BID) | ORAL | Status: DC
  Administered 2011-06-10 – 2011-06-11 (×3): 9.375 mg via ORAL
  Filled 2011-06-10 (×5): qty 1

## 2011-06-10 MED ORDER — HYDRALAZINE HCL 20 MG/ML IJ SOLN
10.0000 mg | Freq: Three times a day (TID) | INTRAMUSCULAR | Status: DC | PRN
  Filled 2011-06-10: qty 0.5

## 2011-06-10 MED ORDER — FENTANYL CITRATE 0.05 MG/ML IJ SOLN
INTRAMUSCULAR | Status: AC
  Filled 2011-06-10: qty 2

## 2011-06-10 MED ORDER — OXYCODONE-ACETAMINOPHEN 5-325 MG PO TABS
1.0000 | ORAL_TABLET | ORAL | Status: DC | PRN
  Administered 2011-06-11 – 2011-06-12 (×4): 2 via ORAL
  Filled 2011-06-10 (×4): qty 2

## 2011-06-10 MED ORDER — FUROSEMIDE 80 MG PO TABS
80.0000 mg | ORAL_TABLET | Freq: Two times a day (BID) | ORAL | Status: DC
  Administered 2011-06-10 (×2): 80 mg via ORAL
  Filled 2011-06-10 (×5): qty 1

## 2011-06-10 MED ORDER — DIAZEPAM 5 MG PO TABS: 5.0000 mg | ORAL_TABLET | ORAL | Status: DC | PRN

## 2011-06-10 MED ORDER — SODIUM CHLORIDE 0.9 % IV SOLN
INTRAVENOUS | Status: AC
  Administered 2011-06-10: 10:00:00 via INTRAVENOUS

## 2011-06-10 MED ORDER — HYDRALAZINE HCL 20 MG/ML IJ SOLN
10.0000 mg | Freq: Once | INTRAMUSCULAR | Status: AC
  Administered 2011-06-10: 10 mg via INTRAVENOUS
  Filled 2011-06-10: qty 0.5

## 2011-06-10 MED ORDER — PANTOPRAZOLE SODIUM 40 MG PO TBEC
40.0000 mg | DELAYED_RELEASE_TABLET | Freq: Every day | ORAL | Status: DC
  Administered 2011-06-10 – 2011-06-12 (×3): 40 mg via ORAL
  Filled 2011-06-10 (×3): qty 1

## 2011-06-10 MED ORDER — MIDAZOLAM HCL 2 MG/2ML IJ SOLN
INTRAMUSCULAR | Status: AC
  Filled 2011-06-10: qty 2

## 2011-06-10 MED ORDER — TRAMADOL-ACETAMINOPHEN 37.5-325 MG PO TABS
1.0000 | ORAL_TABLET | ORAL | Status: DC | PRN
  Administered 2011-06-10: 2 via ORAL
  Filled 2011-06-10: qty 2

## 2011-06-10 MED ORDER — ACETAMINOPHEN 325 MG PO TABS
650.0000 mg | ORAL_TABLET | ORAL | Status: DC | PRN
  Administered 2011-06-11: 650 mg via ORAL
  Filled 2011-06-10: qty 2

## 2011-06-10 NOTE — Progress Notes (Signed)
Patient's bed rest ended at 1:30pm. Upon assessment of incision site from cath procedure, the dressing was half way saturated. Cath lab was then called and notified. Cath lab stated to redress dressing and have patient continue to lay down for bed rest for another hour. If bleeding continues then to notify the cardiologist. No complaints of pain. Will continue to monitor for changes in condition.

## 2011-06-10 NOTE — Progress Notes (Signed)
VASCULAR LAB PRELIMINARY  PRELIMINARY  PRELIMINARY  PRELIMINARY  VASCULAR LAB PRELIMINARY  PRELIMINARY  PRELIMINARY  PRELIMINARY  Left lower extremity venous duplex completed.    Preliminary report:  Left:  No evidence of DVT, superficial thrombosis, or Baker's cyst.  Machaela Caterino D, RVS 06/10/2011, 5:01 PM

## 2011-06-10 NOTE — Progress Notes (Signed)
Patient's BP remains high. BP is now 167/112. Patient is asleep, no complaints of a headache. MD notified and new orders given. Will continue to monitor patient for further changes in condition.

## 2011-06-10 NOTE — Interval H&P Note (Signed)
History and Physical Interval Note:  06/10/2011 7:51 AM  Laurie Wade  has presented today for surgery, with the diagnosis of chf  The various methods of treatment have been discussed with the patient and family. After consideration of risks, benefits and other options for treatment, the patient has consented to  Procedure(s) (LRB): LEFT AND RIGHT HEART CATHETERIZATION WITH CORONARY ANGIOGRAM (N/A) as a surgical intervention .  The patients' history has been reviewed, patient examined, no change in status, stable for surgery.  I have reviewed the patients' chart and labs.  Questions were answered to the patient's satisfaction.     [name]

## 2011-06-10 NOTE — Progress Notes (Signed)
Subjective: She is frustrated about the migraine . imitrex improved the pain. She wants to go home tomorrow.   Objective: Weight change:   Intake/Output Summary (Last 24 hours) at 06/10/11 1603 Last data filed at 06/10/11 1534  Gross per 24 hour  Intake    360 ml  Output   2000 ml  Net  -1640 ml    Filed Vitals:   06/10/11 1358  BP: 161/113  Pulse: 99  Temp: 98.4 F (36.9 C)  Resp: 20  General: No distress, comfortable sitting up  Neuro: no deficits  HEENT:PERLA  Neck: Supple  Cardiovascular: RRR, no murmurs  Lungs: CTAB  Abdomen: Soft, non tender, bowel sounds present  Musculoskeletal: TRACE edema ,  Skin: Intact    Lab Results: Results for orders placed during the hospital encounter of 06/06/11 (from the past 24 hour(s))  PROTIME-INR     Status: Normal   Collection Time   06/10/11  5:39 AM      Component Value Range   Prothrombin Time 14.5  11.6 - 15.2 (seconds)   INR 1.11  0.00 - 1.49   URIC ACID     Status: Normal   Collection Time   06/10/11  5:51 AM      Component Value Range   Uric Acid, Serum 5.2  2.4 - 7.0 (mg/dL)  POCT I-STAT 3, BLOOD GAS (G3+)     Status: Abnormal   Collection Time   06/10/11  8:05 AM      Component Value Range   pH, Arterial 7.427 (*) 7.350 - 7.400    pCO2 arterial 31.9 (*) 35.0 - 45.0 (mmHg)   pO2, Arterial 93.0  80.0 - 100.0 (mmHg)   Bicarbonate 21.0  20.0 - 24.0 (mEq/L)   TCO2 22  0 - 100 (mmol/L)   O2 Saturation 97.0     Acid-base deficit 3.0 (*) 0.0 - 2.0 (mmol/L)   Sample type ARTERIAL    POCT I-STAT 3, BLOOD GAS (G3P V)     Status: Abnormal   Collection Time   06/10/11  8:18 AM      Component Value Range   pH, Ven 7.405 (*) 7.250 - 7.300    pCO2, Ven 36.1 (*) 45.0 - 50.0 (mmHg)   pO2, Ven 35.0  30.0 - 45.0 (mmHg)   Bicarbonate 22.6  20.0 - 24.0 (mEq/L)   TCO2 24  0 - 100 (mmol/L)   O2 Saturation 69.0     Acid-base deficit 2.0  0.0 - 2.0 (mmol/L)   Sample type VENOUS     Comment NOTIFIED PHYSICIAN    POCT I-STAT 3,  BLOOD GAS (G3P V)     Status: Abnormal   Collection Time   06/10/11  8:28 AM      Component Value Range   pH, Ven 7.397 (*) 7.250 - 7.300    pCO2, Ven 34.5 (*) 45.0 - 50.0 (mmHg)   pO2, Ven 40.0  30.0 - 45.0 (mmHg)   Bicarbonate 21.2  20.0 - 24.0 (mEq/L)   TCO2 22  0 - 100 (mmol/L)   O2 Saturation 76.0     Acid-base deficit 3.0 (*) 0.0 - 2.0 (mmol/L)   Sample type VENOUS    CBC     Status: Abnormal   Collection Time   06/10/11 10:50 AM      Component Value Range   WBC 9.6  4.0 - 10.5 (K/uL)   RBC 4.24  3.87 - 5.11 (MIL/uL)   Hemoglobin 9.6 (*) 12.0 - 15.0 (g/dL)  HCT 34.0 (*) 36.0 - 46.0 (%)   MCV 80.2  78.0 - 100.0 (fL)   MCH 22.6 (*) 26.0 - 34.0 (pg)   MCHC 28.2 (*) 30.0 - 36.0 (g/dL)   RDW 16.1 (*) 09.6 - 15.5 (%)   Platelets 334  150 - 400 (K/uL)  CREATININE, SERUM     Status: Normal   Collection Time   06/10/11 10:50 AM      Component Value Range   Creatinine, Ser 0.60  0.50 - 1.10 (mg/dL)   GFR calc non Af Amer >90  >90 (mL/min)   GFR calc Af Amer >90  >90 (mL/min)  RETICULOCYTES     Status: Abnormal   Collection Time   06/10/11 12:47 PM      Component Value Range   Retic Ct Pct 5.1 (*) 0.4 - 3.1 (%)   RBC. 4.28  3.87 - 5.11 (MIL/uL)   Retic Count, Manual 218.3 (*) 19.0 - 186.0 (K/uL)    Micro Results: No results found for this or any previous visit (from the past 240 hour(s)).  Studies/Results: Ct Abdomen Pelvis Wo Contrast  06/10/2011  *RADIOLOGY REPORT*  Clinical Data: Status post cardiac catheterization.  Rule out retroperitoneal bleed.  Hypertension and asthma.  CT ABDOMEN AND PELVIS WITHOUT CONTRAST  Technique:  Multidetector CT imaging of the abdomen and pelvis was performed following the standard protocol without intravenous contrast.  Comparison: Abdominal ultrasound of 06/06/2011  Findings: Bibasilar dependent subsegmental atelectasis.  Moderate cardiomegaly with a small pericardial effusion.  This is similar. Small right pleural effusion is decreased.  Normal  uninfused appearance of the liver, spleen, stomach, pancreas. Cholecystectomy without biliary ductal dilatation. Normal adrenal glands.  Minimal contrast within the renal collecting systems and ureters, likely related to prior catheterization.  No retroperitoneal or retrocrural adenopathy.  Colonic stool burden suggests constipation.  Normal terminal ileum.  Appendix likely identified on image 63.  No right lower quadrant inflammation.  Normal small bowel without abdominal ascites.  No pelvic adenopathy.  Expected edema and ill-defined subcutaneous gas within the right groin.  No well-defined hematoma.  No pelvic sidewall or retroperitoneal abdominal hematoma.  Contrast within urinary bladder.  A small amount of fluid within the endocervix versus a Nabothian cyst.  Image 79.  4.5 cm low density left adnexal/ovarian lesion on image 73 is likely a cyst. No right adnexal mass. Trace free pelvic fluid is likely physiologic.  No acute osseous abnormality.  Degenerative disc disease at the lumbosacral junction.  Air about the anterior left abdominal wall is likely iatrogenic; image 47.  IMPRESSION:  1.  No evidence of retroperitoneal or pelvic hematoma.  Expected catheterization related edema and gas within the right groin. 2.  Decreased small right pleural effusion with similar small pericardial effusion. 3. No acute process in the abdomen or pelvis. 4. Possible constipation. 5.  Left ovarian low density lesion is most likely a cyst. Especially if there are symptoms of left-sided pelvic pain, further evaluation with non emergent pelvic ultrasound should be considered.  Original Report Authenticated By: Consuello Bossier, M.D.   Dg Chest 2 View  06/06/2011  *RADIOLOGY REPORT*  Clinical Data: Shortness of breath, chest pain, cough  CHEST - 2 VIEW  Comparison: None.  Findings: No pneumonia is seen.  However there are somewhat prominent markings bilaterally with peribronchial thickening.  This could indicate bronchitis, but  a viral pneumonia would be difficult to exclude.  There is mild cardiomegaly present.  No bony abnormality is seen.  IMPRESSION:  1.  Somewhat prominent interstitial markings with peribronchial thickening.  Possible bronchitis but cannot exclude viral pneumonia. 2.  Cardiomegaly.  Original Report Authenticated By: Juline Patch, M.D.   Dg Ankle 2 Views Left  06/09/2011  *RADIOLOGY REPORT*  Clinical Data: Medial pain and swelling for 1-1/2 weeks.  No known injury.  LEFT ANKLE - 2 VIEW  Comparison: None.  Findings: Imaged bones, joints and soft tissues appear normal.  IMPRESSION: Negative study.  Original Report Authenticated By: Bernadene Bell. Maricela Curet, M.D.   Ct Angio Chest W/cm &/or Wo Cm  06/07/2011  *RADIOLOGY REPORT*  Clinical Data: Cough with chest pain and shortness of breath. Elevated D-dimer proteinuria with lower extremity swelling.  CT ANGIOGRAPHY CHEST  Technique:  Multidetector CT imaging of the chest using the standard protocol during bolus administration of intravenous contrast. Multiplanar reconstructed images including MIPs were obtained and reviewed to evaluate the vascular anatomy.  Contrast: OMNIPAQUE IOHEXOL 300 MG/ML  SOLN  Comparison: Chest x-ray earlier today.  Findings: Moderate sized right pleural effusion with smaller significant left effusion.  Cardiomegaly with mild to moderate pericardial effusion.  There are no focal infiltrates or masses. The esophagus is normal except for a apparent tail lodged in the distal esophagus.  Mild stranding left lung base. No visible hilar or mediastinal masses are significant adenopathy.  Good opacification of the pulmonary vasculature and aorta.  No evidence for pulmonary embolic disease.  No significant atherosclerosis of the arch or  great vessels.  Question faint tiny calcification left main coronary artery. Normal thoracic spine and sternum.  No displaced rib fracture.  The Cuts of the upper abdomen are grossly unremarkable.  Compared the prior  chest x-ray, the effusions are difficult to visualize.  IMPRESSION: Bilateral pleural effusions, right greater than left along with pericardial effusion.  Cardiomegaly.  No underlying infiltrate, and no evidence for pulmonary emboli. The effusions could be consistent with clinical impression of possible nephrotic syndrome.  Radiopaque density lodged in distal esophagus, likely pill.  Original Report Authenticated By: Elsie Stain, M.D.   US Renal  06/06/2011  *RADIOLOGY REPORT*  Clinical Data: Proteinuria.  RENAL/URINARY TRACT ULTRASOUND COMPLETE  Comparison:  None.  Findings:  Right Kidney:  Measures 11.2 cm and appears normal without stone, mass or hydronephrosis.  Cortical echogenicity is also normal.  Left Kidney:  Measures 10.6 cm and appears normal without stone, mass or hydronephrosis.  Cortical echogenicity is normal.  Bladder:  Normal appearance.  IMPRESSION: Normal study.  Original Report Authenticated By: Bernadene Bell. Maricela Curet, M.D.   Medications: Scheduled Meds:   . aspirin  324 mg Oral Pre-Cath  . carvedilol  9.375 mg Oral BID WC  . enoxaparin  40 mg Subcutaneous Q24H  . fentaNYL      . ferrous sulfate  325 mg Oral BID WC  . furosemide  80 mg Oral BID  . heparin      . hydrALAZINE      . hydrALAZINE  10 mg Intravenous Once  . lidocaine      . midazolam      . moxifloxacin  400 mg Oral q1800  . nitroGLYCERIN      . pantoprazole  40 mg Oral Q1200  . potassium chloride  40 mEq Oral BID  . ramipril  5 mg Oral Daily  . spironolactone  25 mg Oral Daily  . DISCONTD: carvedilol  6.25 mg Oral BID WC  . DISCONTD: carvedilol  9.375 mg Oral BID WC  . DISCONTD: diazepam  5 mg Oral On Call  . DISCONTD: enoxaparin  40 mg Subcutaneous Q24H  . DISCONTD: furosemide  80 mg Oral BID  . DISCONTD: moxifloxacin  400 mg Intravenous q1800  . DISCONTD: pantoprazole (PROTONIX) IV  40 mg Intravenous Daily  . DISCONTD: sodium chloride  3 mL Intravenous Q12H   Continuous Infusions:   . sodium  chloride 75 mL/hr at 06/10/11 1000   PRN Meds:.acetaminophen, alum & mag hydroxide-simeth, diazepam, docusate sodium, guaiFENesin-dextromethorphan, hydrALAZINE, hydrALAZINE, HYDROcodone-acetaminophen, loperamide, magnesium hydroxide, morphine injection, ondansetron (ZOFRAN) IV, ondansetron, oxyCODONE-acetaminophen, pramoxine-mineral oil-zinc, SUMAtriptan, traMADol-acetaminophen, DISCONTD: sodium chloride, DISCONTD: ondansetron (ZOFRAN) IV, DISCONTD: sodium chloride DISCONTD: traMADol-acetaminophen  Assessment/Plan: Patient Active Hospital Problem List:  Lower extremity swelling  - unclear etiology at this time but given degree of proteinuria which is rather small I am not sure this can be contributed to nephrotic syndrom  - ANA and complement levels ar within normal limits  - cardiology consult obtained recommended transfer to [facility] for possible Cath on Monday for evaluation of Cardiomyopathy contributing to the swelling.  Acute respiratory failure of unclear etiology, ? Diastolic/Systolic CHF  CATH today showed NICM, normal coronaries.  - d-dimer elevated, CT angio confirmed bilateral pleural effusions with cardiomegaly, no PE - ordered 2 D ECHO showing LVEF of 15% and akinesis of the anteroseptal and apical myocardium. And consistent with hight ventricular filling pressure.  - BNP is elevated and with other clinical symptoms suggestive of volume overload will go ahead and proceed with Lasix IV  -GOOD DIURESIS WITH NEG BALANCE OF 2400 ML  Daily weights  - continue with lasix po BID  -Continue with coreg, aspirin, and altace.  HTN, accelaerated  - better controlled.   Hypokalemia  - replete as needed  - BMP in AM  Proteinuria  - unclear etiology, mild degree in the setting on normal kidney function tests, creatinine and GFR  - current autoimmune panel is unremarkable for specific diagnosis  - Renal US is also unremarkable for acute events  - spoke with Dr. 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 Left ankle pain: uric acid level normal. Pain improved. X ray of the left ankle negative for fluid.   Anemia: probably related to heavy menstrual bleeding.  Anemia panel ordered. Ct abd and pelvis no abd hematoma.    Migraine headache: Imitrex ordered. And Headache is better  DVT Prophylaxis - Lovenox  Code Status - Full    LOS: 4 days   Authur Cubit 06/10/2011, 4:03 PM

## 2011-06-10 NOTE — Progress Notes (Signed)
Patient return from cath procedure. BP is high and she is complaining of a headache. BP was 153/103. PA notified and orders were given. Will continue to monitor patient for further changes in condition.

## 2011-06-10 NOTE — H&P (View-Only) (Signed)
Advanced Heart Failure Rounding Note   Subjective:    Feels OK. Ambulating room. Had migraine last night. No orthopnea or PND. I/O not recorded for this am. Weight going back up. BP up.   C/o pain in left ankle. Taking NARCO every 4 hours. No BM since Wednesday.  Objective:   Weight Range:  Vital Signs:   Temp:  [97.5 F (36.4 C)-98.7 F (37.1 C)] 98.7 F (37.1 C) (06/02 0724) Pulse Rate:  [81-100] 100  (06/02 0724) Resp:  [16-19] 16  (06/02 0724) BP: (124-156)/(87-117) 156/117 mmHg (06/02 0724) SpO2:  [100 %] 100 % (06/02 0724) Weight:  [85.4 kg (188 lb 4.4 oz)] 85.4 kg (188 lb 4.4 oz) (06/02 0724) Last BM Date: 07/06/11  Weight change: Filed Weights   06/07/11 1557 06/08/11 0540 06/09/11 0724  Weight: 85.1 kg (187 lb 9.8 oz) 84 kg (185 lb 3 oz) 85.4 kg (188 lb 4.4 oz)    Intake/Output:   Intake/Output Summary (Last 24 hours) at 06/09/11 0854 Last data filed at 06/09/11 0803  Gross per 24 hour  Intake    920 ml  Output      0 ml  Net    920 ml     Physical Exam: General:  Well appearing. No resp difficulty HEENT: normal Neck: supple. JVP 8 . Carotids 2+ bilat; no bruits. No lymphadenopathy or thryomegaly appreciated. Cor: PMI nondisplaced. Regular rate & rhythm. +s3 Lungs: clear Abdomen: soft, nontender, nondistended. No hepatosplenomegaly. No bruits or masses. Good bowel sounds. Extremities: no cyanosis, clubbing, rash, tr-1+edema Neuro: alert & orientedx3, cranial nerves grossly intact. moves all 4 extremities w/o difficulty. Flat affect.   Telemetry:  SR 95-110  Labs: Basic Metabolic Panel:  Lab 06/09/11 1478 06/08/11 0550 06/07/11 0750 06/06/11 1615 06/06/11 1325 06/06/11 0840  NA 143 142 134* 139 -- 139  K 3.8 2.9* 3.7 2.9* -- 2.3*  CL 109 106 102 104 -- 103  CO2 22 23 20 24  -- 26  GLUCOSE 94 92 115* 121* -- 109*  BUN 15 13 13 14  -- 13  CREATININE 0.78 0.78 0.61 0.79 -- 0.84  CALCIUM 9.0 8.8 8.7 -- -- --  MG 1.9 -- -- -- 1.8 --  PHOS -- -- -- --  4.4 --    Liver Function Tests:  Lab 06/06/11 1615 06/06/11 0840  AST 13 11  ALT 16 15  ALKPHOS 54 52  BILITOT 0.4 0.4  PROT 5.8* 5.9*  ALBUMIN 3.3* 3.3*   No results found for this basename: LIPASE:5,AMYLASE:5 in the last 168 hours No results found for this basename: AMMONIA:3 in the last 168 hours  CBC:  Lab 06/08/11 0550 06/07/11 0750 06/06/11 0840  WBC 6.5 8.4 6.3  NEUTROABS -- -- 4.1  HGB 8.2* 9.3* 7.6*  HCT 28.4* 31.7* 26.9*  MCV 79.3 78.3 78.7  PLT 224 293 227    Cardiac Enzymes:  Lab 06/07/11 0750 06/06/11 2355 06/06/11 1613  CKTOTAL 38 44 49  CKMB 3.1 3.3 3.6  CKMBINDEX -- -- --  TROPONINI <0.30 <0.30 <0.30    BNP: BNP (last 3 results)  Basename 06/06/11 1830  PROBNP 3288.0*      Imaging:  No results found.   Medications:     Scheduled Medications:    . aspirin  324 mg Oral Pre-Cath  . carvedilol  6.25 mg Oral BID WC  . diazepam  5 mg Oral On Call  . enoxaparin  40 mg Subcutaneous Q24H  . ferrous sulfate  325 mg  Oral BID WC  . furosemide  40 mg Oral BID  . moxifloxacin  400 mg Intravenous q1800  . pantoprazole (PROTONIX) IV  40 mg Intravenous Daily  . potassium chloride  10 mEq Intravenous Q1 Hr x 2  . potassium chloride  40 mEq Oral TID  . ramipril  5 mg Oral Daily  . sodium chloride  3 mL Intravenous Q12H  . spironolactone  12.5 mg Oral Daily     Infusions:     PRN Medications:  sodium chloride, hydrALAZINE, HYDROcodone-acetaminophen, morphine injection, ondansetron (ZOFRAN) IV, ondansetron, sodium chloride   Assessment:   1. 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 Severe HTN with associated HF 3. Iron-def anemia, suspect due to heavy menses 4. Migraine HAs   Plan/Discussion:    Improving but still with some volume on board. Will increase lasix to 80 po bid. Increase spiro 25 daily.  Plan cath tomorrow.  BP up slightly this am. Hopefully will improve with increased lasix and  spiro. Can titrate other meds as needed but don't want BP < 120 for now.  Will check uric acid to exclude gout as cause of L ankle pain. Hopefully primary team can wean narcotics.  Needs stool softener with iron supplementation. Will add colace. Give laxative today.  She has had tubal ligation previously but this is not sufficient contraception in setting of her severe HF and teratogenic risk of HF meds to possible fetus. Will need gyn to see soon regarding her heavy menstrual periods and to discuss possible IUD. Will ask primary team to help arrange.  Length of Stay: 3 Castin Donaghue 06/09/2011, 8:54 AM

## 2011-06-10 NOTE — Progress Notes (Signed)
After receiving medication patient had an episode, where she felt nauseous and began to vomit. PRN zofran was given. MD notified. No other new orders given. Will continue to monitor patient for further changes in condition.

## 2011-06-10 NOTE — Progress Notes (Signed)
Pharmacist to Physician Communication:  Protonix IV to PO; Avelox IV to PO  This patient has been identified as able to take oral medications (tolerating diet of full liquids or better or gastric tube feedings for >24 hours OR taking other scheduled oral medications for >24 hours) and expected plan for continued treatment is at least 1 day.   This patient does not meet any of the exclusion criteria: Active GI bleeding or impaired GI absorption (gastrectomy, short bowel or on TNA or NPO) or s/p esophagectomy.  Protonix has been converted to PO/PT route (1:1).  P & T approval to switch fluoroquinolones to PO route for respiratory infections. Verify clinical improvement (24hour max temp ? 100.5 F or MD/patient assessment of improvement), ability to take oral medications (tolerating diet of full liquids or better or gastric tube feedings for >24 hours OR taking other scheduled oral medications for >24 hours) and expected plan for continued treatment is at least 1 day. EXCLUSION: Neutropenia (ANC < 1000), active GI bleeding or impaired GI absorption (gastrectomy, short bowel or on TNA or NPO), s/p esophagectomy.  Avelox has been converted to PO route (1:1).  Thank you! Rolland Porter, Pharm.D., BCPS Clinical Pharmacist Pager: [phone]

## 2011-06-10 NOTE — CV Procedure (Signed)
Cardiac Cath Procedure Note  Indication: Heart failure, malignant HTN  Procedures performed:  1) Right heart cathererization 2) Selective coronary angiography 3) Left heart catheterization 4) Left ventriculogram 5) Bilateral renal angiograms  Description of procedure:     The risks and indication of the procedure were explained. Consent was signed and placed on the chart. An appropriate timeout was taken prior to the procedure. The right groin was prepped and draped in the routine sterile fashion and anesthetized with 1% local lidocaine.   A 5 FR arterial sheath was placed in the right femoral artery using a modified Seldinger technique. Standard catheters including a JL4, JR4 and angled pigtail were used. All catheter exchanges were made over a wire. A 7 FR venous sheath was placed in the right femoral vein using a modified Seldinger technique. A standard Swan-Ganz catheter was used for the procedure.   Complications:  None apparent  Findings:  RA = 18  RV = 54/11 (19)  PA =  54/28 (37)   PCW = 26 Fick cardiac output/index = 7.5/3.9 Thermo CO/CI = 6.5/3.4 PVR = 1.5 Woods SVR = FA sat = 97% PA sat = 64%, 69% SVC sat = 73%  Ao Pressure: 151/109 (129) LV Pressure:  149/24/26 There was no signficant gradient across the aortic valve on pullback.  Left main: Normal  LAD: Large vessel. Wraps apex and supplies a good portion of PDA. Normal  LCX: Large vessel. Normal   RCA: Moderate to large vessel. Normal  R RENAL ANGIO:  Normal. No stenosis or FMD.  L RENAL ANGIO:  Normal. No stenosis or FMD.  LV-gram done in the RAO projection: Ejection fraction = 30-35% Global HK  Assessment:  1. Normal coronaries 2. Moderate LV dysfunction due toe NICM (likely HTNive) = EF 30-35% 3. Moderately elevated biventricular pressures 4. Preserved cardiac output 5. Normal renal arteries 6. Severe HTN  Plan/Discussion:  EF appears better than on ECHO. Appears to have biventricular  systolic and diastolic dysfunction due to hypertensive heart disease. Needs continue diuresis and titration of HF meds. Primary team to further w/u anemia.  Laurie Wade 8:41 AM    Laurie Meres, MD 8:32 AM

## 2011-06-11 DIAGNOSIS — I517 Cardiomegaly: Secondary | ICD-10-CM

## 2011-06-11 DIAGNOSIS — I10 Essential (primary) hypertension: Secondary | ICD-10-CM

## 2011-06-11 DIAGNOSIS — E876 Hypokalemia: Secondary | ICD-10-CM

## 2011-06-11 DIAGNOSIS — I43 Cardiomyopathy in diseases classified elsewhere: Secondary | ICD-10-CM

## 2011-06-11 LAB — VITAMIN B12: Vitamin B-12: 331 pg/mL (ref 211–911)

## 2011-06-11 LAB — CBC
Platelets: 299 10*3/uL (ref 150–400)
RBC: 3.95 MIL/uL (ref 3.87–5.11)
WBC: 7.9 10*3/uL (ref 4.0–10.5)

## 2011-06-11 LAB — BASIC METABOLIC PANEL
BUN: 9 mg/dL (ref 6–23)
CO2: 27 mEq/L (ref 19–32)
Chloride: 105 mEq/L (ref 96–112)
Creatinine, Ser: 0.89 mg/dL (ref 0.50–1.10)
Potassium: 4.1 mEq/L (ref 3.5–5.1)

## 2011-06-11 LAB — IRON AND TIBC: TIBC: 430 ug/dL (ref 250–470)

## 2011-06-11 MED ORDER — SPIRONOLACTONE 12.5 MG HALF TABLET
12.5000 mg | ORAL_TABLET | Freq: Every day | ORAL | Status: DC
  Administered 2011-06-11 – 2011-06-12 (×2): 12.5 mg via ORAL
  Filled 2011-06-11 (×2): qty 1

## 2011-06-11 MED ORDER — FUROSEMIDE 40 MG PO TABS
40.0000 mg | ORAL_TABLET | Freq: Two times a day (BID) | ORAL | Status: DC
  Administered 2011-06-11 – 2011-06-12 (×3): 40 mg via ORAL
  Filled 2011-06-11 (×5): qty 1

## 2011-06-11 MED ORDER — SUMATRIPTAN SUCCINATE 50 MG PO TABS
50.0000 mg | ORAL_TABLET | ORAL | Status: DC | PRN
  Administered 2011-06-11: 50 mg via ORAL
  Filled 2011-06-11 (×2): qty 1

## 2011-06-11 MED ORDER — CARVEDILOL 6.25 MG PO TABS
6.2500 mg | ORAL_TABLET | Freq: Two times a day (BID) | ORAL | Status: DC
  Administered 2011-06-11 – 2011-06-12 (×2): 6.25 mg via ORAL
  Filled 2011-06-11 (×4): qty 1

## 2011-06-11 MED ORDER — FERROUS SULFATE 325 (65 FE) MG PO TABS
325.0000 mg | ORAL_TABLET | Freq: Three times a day (TID) | ORAL | Status: DC
  Administered 2011-06-11 – 2011-06-12 (×3): 325 mg via ORAL
  Filled 2011-06-11 (×5): qty 1

## 2011-06-11 NOTE — Progress Notes (Signed)
Subjective: Had an extensive discussion with the patient about her anemia. She reports that she had tubal ligation after her last pregnancy. She does not want IUD implant. She is willing to see a gynecologist as outpatient.   Objective: Weight change: -5.431 kg (-11 lb 15.6 oz)  Intake/Output Summary (Last 24 hours) at 06/11/11 1954 Last data filed at 06/11/11 1700  Gross per 24 hour  Intake   1320 ml  Output      0 ml  Net   1320 ml    Filed Vitals:   06/11/11 1703  BP: 104/68  Pulse: 97  Temp:   Resp:     General: No distress, comfortable sitting up  Neuro: no deficits  HEENT:PERLA  Neck: Supple  Cardiovascular: RRR, no murmurs  Lungs: CTAB  Abdomen: Soft, non tender, bowel sounds present  Musculoskeletal: TRACE edema ,  Skin: Intact  Lab Results: Results for orders placed during the hospital encounter of 06/06/11 (from the past 24 hour(s))  CBC     Status: Abnormal   Collection Time   06/11/11  6:22 AM      Component Value Range   WBC 7.9  4.0 - 10.5 (K/uL)   RBC 3.95  3.87 - 5.11 (MIL/uL)   Hemoglobin 9.0 (*) 12.0 - 15.0 (g/dL)   HCT 24.4 (*) 01.0 - 46.0 (%)   MCV 82.0  78.0 - 100.0 (fL)   MCH 22.8 (*) 26.0 - 34.0 (pg)   MCHC 27.8 (*) 30.0 - 36.0 (g/dL)   RDW 27.2 (*) 53.6 - 15.5 (%)   Platelets 299  150 - 400 (K/uL)  BASIC METABOLIC PANEL     Status: Abnormal   Collection Time   06/11/11 10:00 AM      Component Value Range   Sodium 142  135 - 145 (mEq/L)   Potassium 4.1  3.5 - 5.1 (mEq/L)   Chloride 105  96 - 112 (mEq/L)   CO2 27  19 - 32 (mEq/L)   Glucose, Bld 104 (*) 70 - 99 (mg/dL)   BUN 9  6 - 23 (mg/dL)   Creatinine, Ser 6.44  0.50 - 1.10 (mg/dL)   Calcium 8.8  8.4 - 03.4 (mg/dL)   GFR calc non Af Amer 85 (*) >90 (mL/min)   GFR calc Af Amer >90  >90 (mL/min)     Micro Results: No results found for this or any previous visit (from the past 240 hour(s)).  Studies/Results: Ct Abdomen Pelvis Wo Contrast  06/10/2011  *RADIOLOGY REPORT*  Clinical  Data: Status post cardiac catheterization.  Rule out retroperitoneal bleed.  Hypertension and asthma.  CT ABDOMEN AND PELVIS WITHOUT CONTRAST  Technique:  Multidetector CT imaging of the abdomen and pelvis was performed following the standard protocol without intravenous contrast.  Comparison: Abdominal ultrasound of 06/06/2011  Findings: Bibasilar dependent subsegmental atelectasis.  Moderate cardiomegaly with a small pericardial effusion.  This is similar. Small right pleural effusion is decreased.  Normal uninfused appearance of the liver, spleen, stomach, pancreas. Cholecystectomy without biliary ductal dilatation. Normal adrenal glands.  Minimal contrast within the renal collecting systems and ureters, likely related to prior catheterization.  No retroperitoneal or retrocrural adenopathy.  Colonic stool burden suggests constipation.  Normal terminal ileum.  Appendix likely identified on image 63.  No right lower quadrant inflammation.  Normal small bowel without abdominal ascites.  No pelvic adenopathy.  Expected edema and ill-defined subcutaneous gas within the right groin.  No well-defined hematoma.  No pelvic sidewall or retroperitoneal abdominal  hematoma.  Contrast within urinary bladder.  A small amount of fluid within the endocervix versus a Nabothian cyst.  Image 79.  4.5 cm low density left adnexal/ovarian lesion on image 73 is likely a cyst. No right adnexal mass. Trace free pelvic fluid is likely physiologic.  No acute osseous abnormality.  Degenerative disc disease at the lumbosacral junction.  Air about the anterior left abdominal wall is likely iatrogenic; image 47.  IMPRESSION:  1.  No evidence of retroperitoneal or pelvic hematoma.  Expected catheterization related edema and gas within the right groin. 2.  Decreased small right pleural effusion with similar small pericardial effusion. 3. No acute process in the abdomen or pelvis. 4. Possible constipation. 5.  Left ovarian low density lesion is  most likely a cyst. Especially if there are symptoms of left-sided pelvic pain, further evaluation with non emergent pelvic ultrasound should be considered.  Original Report Authenticated By: Consuello Bossier, M.D.   Dg Chest 2 View  06/06/2011  *RADIOLOGY REPORT*  Clinical Data: Shortness of breath, chest pain, cough  CHEST - 2 VIEW  Comparison: None.  Findings: No pneumonia is seen.  However there are somewhat prominent markings bilaterally with peribronchial thickening.  This could indicate bronchitis, but a viral pneumonia would be difficult to exclude.  There is mild cardiomegaly present.  No bony abnormality is seen.  IMPRESSION:  1.  Somewhat prominent interstitial markings with peribronchial thickening.  Possible bronchitis but cannot exclude viral pneumonia. 2.  Cardiomegaly.  Original Report Authenticated By: Juline Patch, M.D.   Dg Ankle 2 Views Left  06/09/2011  *RADIOLOGY REPORT*  Clinical Data: Medial pain and swelling for 1-1/2 weeks.  No known injury.  LEFT ANKLE - 2 VIEW  Comparison: None.  Findings: Imaged bones, joints and soft tissues appear normal.  IMPRESSION: Negative study.  Original Report Authenticated By: Bernadene Bell. Maricela Curet, M.D.   Ct Angio Chest W/cm &/or Wo Cm  06/07/2011  *RADIOLOGY REPORT*  Clinical Data: Cough with chest pain and shortness of breath. Elevated D-dimer proteinuria with lower extremity swelling.  CT ANGIOGRAPHY CHEST  Technique:  Multidetector CT imaging of the chest using the standard protocol during bolus administration of intravenous contrast. Multiplanar reconstructed images including MIPs were obtained and reviewed to evaluate the vascular anatomy.  Contrast: OMNIPAQUE IOHEXOL 300 MG/ML  SOLN  Comparison: Chest x-ray earlier today.  Findings: Moderate sized right pleural effusion with smaller significant left effusion.  Cardiomegaly with mild to moderate pericardial effusion.  There are no focal infiltrates or masses. The esophagus is normal except for  a apparent tail lodged in the distal esophagus.  Mild stranding left lung base. No visible hilar or mediastinal masses are significant adenopathy.  Good opacification of the pulmonary vasculature and aorta.  No evidence for pulmonary embolic disease.  No significant atherosclerosis of the arch or  great vessels.  Question faint tiny calcification left main coronary artery. Normal thoracic spine and sternum.  No displaced rib fracture.  The Cuts of the upper abdomen are grossly unremarkable.  Compared the prior chest x-ray, the effusions are difficult to visualize.  IMPRESSION: Bilateral pleural effusions, right greater than left along with pericardial effusion.  Cardiomegaly.  No underlying infiltrate, and no evidence for pulmonary emboli. The effusions could be consistent with clinical impression of possible nephrotic syndrome.  Radiopaque density lodged in distal esophagus, likely pill.  Original Report Authenticated By: Elsie Stain, M.D.   US Renal  06/06/2011  *RADIOLOGY REPORT*  Clinical Data:  Proteinuria.  RENAL/URINARY TRACT ULTRASOUND COMPLETE  Comparison:  None.  Findings:  Right Kidney:  Measures 11.2 cm and appears normal without stone, mass or hydronephrosis.  Cortical echogenicity is also normal.  Left Kidney:  Measures 10.6 cm and appears normal without stone, mass or hydronephrosis.  Cortical echogenicity is normal.  Bladder:  Normal appearance.  IMPRESSION: Normal study.  Original Report Authenticated By: Bernadene Bell. Maricela Curet, M.D.   Medications: Scheduled Meds:   . carvedilol  6.25 mg Oral BID WC  . enoxaparin  40 mg Subcutaneous Q24H  . ferrous sulfate  325 mg Oral TID WC  . furosemide  40 mg Oral BID  . moxifloxacin  400 mg Oral q1800  . pantoprazole  40 mg Oral Q1200  . potassium chloride  40 mEq Oral BID  . ramipril  5 mg Oral Daily  . spironolactone  12.5 mg Oral Daily  . DISCONTD: carvedilol  9.375 mg Oral BID WC  . DISCONTD: ferrous sulfate  325 mg Oral BID WC  .  DISCONTD: furosemide  80 mg Oral BID  . DISCONTD: spironolactone  25 mg Oral Daily   Continuous Infusions:  PRN Meds:.acetaminophen, alum & mag hydroxide-simeth, diazepam, docusate sodium, guaiFENesin-dextromethorphan, hydrALAZINE, HYDROcodone-acetaminophen, magnesium hydroxide, morphine injection, ondansetron (ZOFRAN) IV, ondansetron, oxyCODONE-acetaminophen, pramoxine-mineral oil-zinc, SUMAtriptan, traMADol-acetaminophen  Assessment/Plan:  Interim Summary:  31 yo female with no significant PMH  presents to [facility] ED with main concerns of progressively worsening lower extremity swelling and shortness of breath over the last two weeks. She was evaluated for possible nephrotic syndrome. Renal consult was obtained and suggested that this is not nephrotic syndrome. She underwent imaging showing cardiomegaly, it was followed up with a 2 D Echo showing severely depressed LV EF of 15% and an elevated pro bnp. Cardiology consult was obtained, and she was transferred to [facility] cone for further evaluation. 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  Currently she is on diuretics, with a plan to d/c in am.   Hospital course By Problem List:  Lower extremity swelling  Almost resolved. Probably from NICM.    Acute respiratory failure from Acute Diastolic/Systolic CHF   - d-dimer elevated, CT angio confirmed bilateral pleural effusions with cardiomegaly, no PE - ordered 2 D ECHO showing LVEF of 15% and akinesis of the anteroseptal and apical myocardium. And consistent with hight ventricular filling pressure.  -pro bnp was elevated -started on iv diuretics, transitioned to po lasix -Daily weights  -I/O's - CArdiac Cath  showed NICM, normal coronaries.  -Continue with coreg, aspirin, and altace.   HTN, accelaerated  - better controlled.   Hypokalemia  - replete as needed  - BMP in AM   Proteinuria  - unclear etiology,  mild degree in the setting on normal kidney function tests, creatinine and GFR  - current autoimmune panel is unremarkable for specific diagnosis  - Renal US is also unremarkable for acute events  - spoke with Dr. 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   Left ankle pain: uric acid level normal. Pain improved.  X ray of the left ankle negative for fluid.   Anemia: - probably related to heavy menstrual  bleeding.  -Anemia panel showed iron deficiency anemia.  -Ct abd and pelvis no abd hematoma.   -She reports that she had tubal ligation after her last pregnancy. She does not want IUD implant. She is willing to see a gynecologist as outpatient.  Migraine headache: Imitrex ordered. And Headache is better  DVT Prophylaxis - Lovenox  Code Status - Full       LOS: 5 days   Laurie Wade 06/11/2011, 7:54 PM

## 2011-06-11 NOTE — Progress Notes (Signed)
Advanced Heart Failure Rounding Note   Subjective:   31 year old female with a history of HTN and noncompliance associated with HTN medications. New diagnosis systolic heart failure due to hypertension.   ECHO EF 15%  06/11/11 LHC/RHC Normal Coronaries.  RA = 18  RV = 54/11 (19)  PA = 54/28 (37)  PCW = 26  Fick cardiac output/index = 7.5/3.9  Thermo CO/CI = 6.5/3.4  PVR = 1.5 Woods  SVR =  FA sat = 97%  PA sat = 64%, 69%  SVC sat = 73%  Yesterday SBP > 160 and she received hydralazine x 3.  Started on Imitrex.  24 hour I/O = 1.0 liters Weight down 13 pounds from admit.   Denies SOB/PND/Orthopnea. Complains of headache.   Objective:   Weight Range:  Vital Signs:   Temp:  [98.1 F (36.7 C)-100.2 F (37.9 C)] 100.2 F (37.9 C) (06/04 1610) Pulse Rate:  [87-111] 93  (06/04 0608) Resp:  [17-20] 18  (06/04 0608) BP: (96-176)/(52-122) 115/74 mmHg (06/04 0608) SpO2:  [95 %-99 %] 95 % (06/04 9604) Weight:  [79.969 kg (176 lb 4.8 oz)] 79.969 kg (176 lb 4.8 oz) (06/04 0608) Last BM Date: 06/09/11  Weight change: Filed Weights   06/09/11 0724 06/10/11 0455 06/11/11 5409  Weight: 85.4 kg (188 lb 4.4 oz) 83.5 kg (184 lb 1.4 oz) 79.969 kg (176 lb 4.8 oz)    Intake/Output:   Intake/Output Summary (Last 24 hours) at 06/11/11 0902 Last data filed at 06/11/11 0651  Gross per 24 hour  Intake    960 ml  Output   2000 ml  Net  -1040 ml     Physical Exam: General:  Well appearing. No resp difficulty HEENT: normal Neck: supple. JVP 7-8. Carotids 2+ bilat; no bruits. No lymphadenopathy or thryomegaly appreciated. Cor: PMI nondisplaced. Regular rate & rhythm. +s3 Lungs: clear Abdomen: soft, nontender, nondistended. No hepatosplenomegaly. No bruits or masses. Good bowel sounds. Extremities: no cyanosis, clubbing, rash,  No edema R groin no ecchymosis Neuro: alert & orientedx3, cranial nerves grossly intact. moves all 4 extremities w/o difficulty. Flat affect.   Telemetry:  L BBB  SR 95-110  Labs: Basic Metabolic Panel:  Lab 06/10/11 8119 06/09/11 0640 06/08/11 0550 06/07/11 0750 06/06/11 1615 06/06/11 1325 06/06/11 0840  NA -- 143 142 134* 139 -- 139  K -- 3.8 2.9* 3.7 2.9* -- 2.3*  CL -- 109 106 102 104 -- 103  CO2 -- 22 23 20 24  -- 26  GLUCOSE -- 94 92 115* 121* -- 109*  BUN -- 15 13 13 14  -- 13  CREATININE 0.60 0.78 0.78 0.61 0.79 -- --  CALCIUM -- 9.0 8.8 8.7 -- -- --  MG -- 1.9 -- -- -- 1.8 --  PHOS -- -- -- -- -- 4.4 --    Liver Function Tests:  Lab 06/06/11 1615 06/06/11 0840  AST 13 11  ALT 16 15  ALKPHOS 54 52  BILITOT 0.4 0.4  PROT 5.8* 5.9*  ALBUMIN 3.3* 3.3*   No results found for this basename: LIPASE:5,AMYLASE:5 in the last 168 hours No results found for this basename: AMMONIA:3 in the last 168 hours  CBC:  Lab 06/11/11 0622 06/10/11 1050 06/08/11 0550 06/07/11 0750 06/06/11 0840  WBC 7.9 9.6 6.5 8.4 6.3  NEUTROABS -- -- -- -- 4.1  HGB 9.0* 9.6* 8.2* 9.3* 7.6*  HCT 32.4* 34.0* 28.4* 31.7* 26.9*  MCV 82.0 80.2 79.3 78.3 78.7  PLT 299 334 224  293 227    Cardiac Enzymes:  Lab 06/07/11 0750 06/06/11 2355 06/06/11 1613  CKTOTAL 38 44 49  CKMB 3.1 3.3 3.6  CKMBINDEX -- -- --  TROPONINI <0.30 <0.30 <0.30    BNP: BNP (last 3 results)  Basename 06/06/11 1830  PROBNP 3288.0*      Imaging: Ct Abdomen Pelvis Wo Contrast  06/10/2011  *RADIOLOGY REPORT*  Clinical Data: Status post cardiac catheterization.  Rule out retroperitoneal bleed.  Hypertension and asthma.  CT ABDOMEN AND PELVIS WITHOUT CONTRAST  Technique:  Multidetector CT imaging of the abdomen and pelvis was performed following the standard protocol without intravenous contrast.  Comparison: Abdominal ultrasound of 06/06/2011  Findings: Bibasilar dependent subsegmental atelectasis.  Moderate cardiomegaly with a small pericardial effusion.  This is similar. Small right pleural effusion is decreased.  Normal uninfused appearance of the liver, spleen, stomach, pancreas.  Cholecystectomy without biliary ductal dilatation. Normal adrenal glands.  Minimal contrast within the renal collecting systems and ureters, likely related to prior catheterization.  No retroperitoneal or retrocrural adenopathy.  Colonic stool burden suggests constipation.  Normal terminal ileum.  Appendix likely identified on image 63.  No right lower quadrant inflammation.  Normal small bowel without abdominal ascites.  No pelvic adenopathy.  Expected edema and ill-defined subcutaneous gas within the right groin.  No well-defined hematoma.  No pelvic sidewall or retroperitoneal abdominal hematoma.  Contrast within urinary bladder.  A small amount of fluid within the endocervix versus a Nabothian cyst.  Image 79.  4.5 cm low density left adnexal/ovarian lesion on image 73 is likely a cyst. No right adnexal mass. Trace free pelvic fluid is likely physiologic.  No acute osseous abnormality.  Degenerative disc disease at the lumbosacral junction.  Air about the anterior left abdominal wall is likely iatrogenic; image 47.  IMPRESSION:  1.  No evidence of retroperitoneal or pelvic hematoma.  Expected catheterization related edema and gas within the right groin. 2.  Decreased small right pleural effusion with similar small pericardial effusion. 3. No acute process in the abdomen or pelvis. 4. Possible constipation. 5.  Left ovarian low density lesion is most likely a cyst. Especially if there are symptoms of left-sided pelvic pain, further evaluation with non emergent pelvic ultrasound should be considered.  Original Report Authenticated By: Consuello Bossier, M.D.   Dg Ankle 2 Views Left  06/09/2011  *RADIOLOGY REPORT*  Clinical Data: Medial pain and swelling for 1-1/2 weeks.  No known injury.  LEFT ANKLE - 2 VIEW  Comparison: None.  Findings: Imaged bones, joints and soft tissues appear normal.  IMPRESSION: Negative study.  Original Report Authenticated By: Bernadene Bell. Maricela Curet, M.D.     Medications:     Scheduled  Medications:    . carvedilol  9.375 mg Oral BID WC  . enoxaparin  40 mg Subcutaneous Q24H  . ferrous sulfate  325 mg Oral BID WC  . furosemide  80 mg Oral BID  . hydrALAZINE  10 mg Intravenous Once  . moxifloxacin  400 mg Oral q1800  . pantoprazole  40 mg Oral Q1200  . potassium chloride  40 mEq Oral BID  . ramipril  5 mg Oral Daily  . spironolactone  25 mg Oral Daily  . DISCONTD: carvedilol  6.25 mg Oral BID WC  . DISCONTD: carvedilol  9.375 mg Oral BID WC  . DISCONTD: enoxaparin  40 mg Subcutaneous Q24H  . DISCONTD: furosemide  80 mg Oral BID  . DISCONTD: moxifloxacin  400 mg Intravenous q1800  .  DISCONTD: pantoprazole (PROTONIX) IV  40 mg Intravenous Daily    Infusions:    . sodium chloride 75 mL/hr at 06/10/11 1000    PRN Medications: acetaminophen, alum & mag hydroxide-simeth, diazepam, docusate sodium, guaiFENesin-dextromethorphan, hydrALAZINE, HYDROcodone-acetaminophen, magnesium hydroxide, morphine injection, ondansetron (ZOFRAN) IV, ondansetron, oxyCODONE-acetaminophen, pramoxine-mineral oil-zinc, SUMAtriptan, SUMAtriptan, traMADol-acetaminophen, DISCONTD: hydrALAZINE, DISCONTD: ondansetron (ZOFRAN) IV, DISCONTD: traMADol-acetaminophen   Assessment:   1. 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 Severe HTN with associated HF 3. Iron-def anemia, suspect due to heavy menses 4. Migraine HAs   Plan/Discussion:   Volume status improving. Significant reduction in blood pressure. SBP soft <100  and likely a component of headache. Decrease Lasix 40 mg BID. Decrease Spironolactone 12.5 mg daily, and decrease carvedilol 6.25 mg bid. Check BMET today. Start heart failure education diet, fluid restriction, and medication compliance.   Primary Team addressing anemia. She will need follow up with GYN .   Length of Stay: 5 CLEGG,AMYNP-C 06/11/2011, 9:02 AM  Patient seen and examined with Tonye Becket, NP. We discussed all aspects of the  encounter. I agree with the assessment and plan as stated above. Much improved. BP soft agree with changes as stated above. Watch BP over next 24 hours and hopefully can go home in am.  Will need f/u with GYN to discuss possible IUD and work-up heavy menstrual periods.   I have asked primary team to help address and await to see what the plan is from this standpoint.   Terrace Chiem,MD 12:12 PM

## 2011-06-11 NOTE — Discharge Instructions (Signed)
Heart Failure Heart failure happens when the heart muscle does not work normally. This means your heart does not pump blood efficiently for your body to work well. This may cause blood to back up into the lungs or cause your lower legs to swell. Heart failure is a long-term (chronic) condition. It is important for you to take good care of yourself and follow your caregiver's treatment plan.  SYMPTOMS   Shortness of breath with mild exercise or while at rest.   A persistent cough.   Abnormal swelling in the feet and legs.   Unexplained sudden weight gain.   Fatigue and loss of energy.   Feeling lightheaded or close to fainting.   Chest or abdominal pain.  CAUSES   Coronary artery disease.   High blood pressure.   Heart attacks.   Abnormal heart valves.   Lung disease.   Diabetes.  MEDICAL EVALUATION MAY REQUIRE:  Electrocardiogram (EKG).   Chest X-ray.   Blood tests.   Exercise stress test.   Echocardiogram.   Angiogram.   Radionuclide ventriculography.  The symptoms of heart failure can improve with proper treatment. Medications, lifestyle changes, or surgical intervention may be necessary to treat heart failure. Heart medications prescribed include those that lower blood pressure, strengthen contractility, and improve circulation. A diuretic or "water pill" may also be prescribed so excess fluid can be removed from the body. Surgical intervention may include procedures that open blocked arteries or repair damaged heart valves. In some cases, a pacemaker is need to help the heart pump better. Lifestyle changes include quitting smoking, eating a healthy diet, limiting salt intake and limiting alcoholic drinks. Exercise as told by your caregiver.  Salt increases fluid retention and should be avoided. Stay away from food made with a lot of salt. For example, stay away from chips, pretzels, pickles, olives, and canned foods. Eat a 1500 milligram salt (sodium) diet or as told  by your doctor. Weigh yourself each morning after you urinate but before you eat breakfast. Keep a record of your weight to show your caregiver. A sudden weight gain can mean fluid buildup. Tell your caregiver if you gain 3 or more pounds (1.4 kilograms) in a day or 5 pounds (2.3 kilograms) in a week.  Make a list of every medicine, vitamin, or herbal supplement you are taking. Keep the list with you at all times. Show it to your caregiver at every visit. Keep the list up-to-date. Ask your caregiver or pharmacist to write an explanation of each medicine you are taking. SEEK IMMEDIATE MEDICAL CARE IF:  You develop increased breathing, shortness of breath, severe cough or are coughing up blood.   You have severe chest or abdominal pain, or unusual heart palpitations.   You develop weakness or numbness in your face or on one side of the body.  Document Released: 02/01/2004 Document Revised: 12/13/2010 Document Reviewed: 08/06/2007 [employer] Patient Information 2012 [employer]. 

## 2011-06-12 DIAGNOSIS — I43 Cardiomyopathy in diseases classified elsewhere: Secondary | ICD-10-CM

## 2011-06-12 DIAGNOSIS — I10 Essential (primary) hypertension: Secondary | ICD-10-CM

## 2011-06-12 DIAGNOSIS — I517 Cardiomegaly: Secondary | ICD-10-CM

## 2011-06-12 DIAGNOSIS — E876 Hypokalemia: Secondary | ICD-10-CM

## 2011-06-12 LAB — BASIC METABOLIC PANEL
CO2: 30 mEq/L (ref 19–32)
Calcium: 8.7 mg/dL (ref 8.4–10.5)
Chloride: 105 mEq/L (ref 96–112)
Glucose, Bld: 95 mg/dL (ref 70–99)
Sodium: 140 mEq/L (ref 135–145)

## 2011-06-12 MED ORDER — DIAZEPAM 2 MG PO TABS: 5.0000 mg | ORAL_TABLET | Freq: Two times a day (BID) | ORAL | Status: AC | PRN

## 2011-06-12 MED ORDER — HYDROCODONE-ACETAMINOPHEN 5-325 MG PO TABS: 1.0000 | ORAL_TABLET | Freq: Three times a day (TID) | ORAL | Status: AC | PRN

## 2011-06-12 MED ORDER — LISINOPRIL 10 MG PO TABS: 10.0000 mg | ORAL_TABLET | Freq: Every day | ORAL | Status: DC

## 2011-06-12 MED ORDER — OXYCODONE-ACETAMINOPHEN 5-325 MG PO TABS: 1.0000 | ORAL_TABLET | ORAL | Status: AC | PRN

## 2011-06-12 MED ORDER — SPIRONOLACTONE 12.5 MG HALF TABLET: 12.5000 mg | ORAL_TABLET | Freq: Every day | ORAL | Status: DC

## 2011-06-12 MED ORDER — MOXIFLOXACIN HCL 400 MG PO TABS: 400.0000 mg | ORAL_TABLET | Freq: Every day | ORAL | Status: AC

## 2011-06-12 MED ORDER — POTASSIUM CHLORIDE CRYS ER 20 MEQ PO TBCR: 40.0000 meq | EXTENDED_RELEASE_TABLET | Freq: Every day | ORAL | Status: DC

## 2011-06-12 MED ORDER — FERROUS SULFATE 325 (65 FE) MG PO TABS: 325.0000 mg | ORAL_TABLET | Freq: Three times a day (TID) | ORAL | Status: DC

## 2011-06-12 MED ORDER — PANTOPRAZOLE SODIUM 40 MG PO TBEC: 40.0000 mg | DELAYED_RELEASE_TABLET | Freq: Every day | ORAL | Status: DC

## 2011-06-12 MED ORDER — FUROSEMIDE 40 MG PO TABS: 40.0000 mg | ORAL_TABLET | Freq: Two times a day (BID) | ORAL | Status: DC

## 2011-06-12 MED ORDER — CARVEDILOL 6.25 MG PO TABS: 6.2500 mg | ORAL_TABLET | Freq: Two times a day (BID) | ORAL | Status: DC

## 2011-06-12 MED ORDER — GUAIFENESIN-DM 100-10 MG/5ML PO SYRP: 15.0000 mL | ORAL_SOLUTION | ORAL | Status: AC | PRN

## 2011-06-12 NOTE — Progress Notes (Signed)
Pharmacy Consult for Heart Failure Education  Pharmacy evaluated patient for medication compliance, education on Heart Failure action plan, and other factors possibly impacting self care and risk of readmission for heart failure.  Based on the patient interview documented below the patient would benefit from:  Provided patient with a pill box  Patient qualifies for a free medication supply from the Outpatient pharmacy and wishes to use pharmacy on [address].  Reinforced importance of daily weight and recording weights  Reinforced importance of taking medications daily for benefit  Emphasized and explained food labels and importance of a <2g sodium diet    Thank you for allowing pharmacy to be a part of this patients care team.  Lovenia Kim Pharm.D., BCPS Clinical Pharmacist 06/12/2011 11:44 AM Pager: [phone] Phone: [phone]

## 2011-06-12 NOTE — Progress Notes (Addendum)
Advanced Heart Failure Rounding Note   Subjective:   31 year old female with a history of HTN and noncompliance associated with HTN medications. New diagnosis systolic heart failure due to hypertension.   ECHO EF 15%  06/11/11 LHC/RHC Normal Coronaries.  RA = 18  RV = 54/11 (19)  PA = 54/28 (37)  PCW = 26  Fick cardiac output/index = 7.5/3.9  Thermo CO/CI = 6.5/3.4  PVR = 1.5 Woods  SVR =  FA sat = 97%  PA sat = 64%, 69%  SVC sat = 73%  Yesterday SBP soft.  Carvedilol, Spironolactone, and lasix  reduced. SBP <130 and >100. I/O not accurate. Weight up 1 pound. She doesn not want IUD. Has had tubal ligation.    Denies SOB/PND/Orthopnea. Headache improved.   Objective:   Weight Range:  Vital Signs:   Temp:  [97.3 F (36.3 C)-98.4 F (36.9 C)] 97.7 F (36.5 C) (06/05 1610) Pulse Rate:  [20-97] 86  (06/05 0613) Resp:  [18-20] 20  (06/05 0613) BP: (92-126)/(60-84) 126/84 mmHg (06/05 0613) SpO2:  [97 %-100 %] 99 % (06/05 9604) Weight:  [80.287 kg (177 lb)] 80.287 kg (177 lb) (06/05 0613) Last BM Date: 06/11/11  Weight change: Filed Weights   06/10/11 0455 06/11/11 0608 06/12/11 0613  Weight: 83.5 kg (184 lb 1.4 oz) 79.969 kg (176 lb 4.8 oz) 80.287 kg (177 lb)    Intake/Output:   Intake/Output Summary (Last 24 hours) at 06/12/11 0738 Last data filed at 06/11/11 2156  Gross per 24 hour  Intake   1080 ml  Output      0 ml  Net   1080 ml     Physical Exam: General:  Well appearing. No resp difficulty HEENT: normal Neck: supple. JVP 7-8. Carotids 2+ bilat; no bruits. No lymphadenopathy or thryomegaly appreciated. Cor: PMI nondisplaced. Regular rate & rhythm. +s3 Lungs: clear Abdomen: soft, nontender, nondistended. No hepatosplenomegaly. No bruits or masses. Good bowel sounds. Extremities: no cyanosis, clubbing, rash,  No edema R groin no ecchymosis Neuro: alert & orientedx3, cranial nerves grossly intact. moves all 4 extremities w/o difficulty. Flat affect.    Telemetry:  L BBB SR 86-97  Labs: Basic Metabolic Panel:  Lab 06/12/11 5409 06/11/11 1000 06/10/11 1050 06/09/11 0640 06/08/11 0550 06/07/11 0750 06/06/11 1325  NA 140 142 -- 143 142 134* --  K 4.1 4.1 -- 3.8 2.9* 3.7 --  CL 105 105 -- 109 106 102 --  CO2 30 27 -- 22 23 20  --  GLUCOSE 95 104* -- 94 92 115* --  BUN 10 9 -- 15 13 13  --  CREATININE 0.88 0.89 0.60 0.78 0.78 -- --  CALCIUM 8.7 8.8 -- 9.0 -- -- --  MG -- -- -- 1.9 -- -- 1.8  PHOS -- -- -- -- -- -- 4.4    Liver Function Tests:  Lab 06/06/11 1615 06/06/11 0840  AST 13 11  ALT 16 15  ALKPHOS 54 52  BILITOT 0.4 0.4  PROT 5.8* 5.9*  ALBUMIN 3.3* 3.3*   No results found for this basename: LIPASE:5,AMYLASE:5 in the last 168 hours No results found for this basename: AMMONIA:3 in the last 168 hours  CBC:  Lab 06/11/11 0622 06/10/11 1050 06/08/11 0550 06/07/11 0750 06/06/11 0840  WBC 7.9 9.6 6.5 8.4 6.3  NEUTROABS -- -- -- -- 4.1  HGB 9.0* 9.6* 8.2* 9.3* 7.6*  HCT 32.4* 34.0* 28.4* 31.7* 26.9*  MCV 82.0 80.2 79.3 78.3 78.7  PLT 299 334 224  293 227    Cardiac Enzymes:  Lab 06/07/11 0750 06/06/11 2355 06/06/11 1613  CKTOTAL 38 44 49  CKMB 3.1 3.3 3.6  CKMBINDEX -- -- --  TROPONINI <0.30 <0.30 <0.30    BNP: BNP (last 3 results)  Basename 06/06/11 1830  PROBNP 3288.0*      Imaging: Ct Abdomen Pelvis Wo Contrast  06/10/2011  *RADIOLOGY REPORT*  Clinical Data: Status post cardiac catheterization.  Rule out retroperitoneal bleed.  Hypertension and asthma.  CT ABDOMEN AND PELVIS WITHOUT CONTRAST  Technique:  Multidetector CT imaging of the abdomen and pelvis was performed following the standard protocol without intravenous contrast.  Comparison: Abdominal ultrasound of 06/06/2011  Findings: Bibasilar dependent subsegmental atelectasis.  Moderate cardiomegaly with a small pericardial effusion.  This is similar. Small right pleural effusion is decreased.  Normal uninfused appearance of the liver, spleen,  stomach, pancreas. Cholecystectomy without biliary ductal dilatation. Normal adrenal glands.  Minimal contrast within the renal collecting systems and ureters, likely related to prior catheterization.  No retroperitoneal or retrocrural adenopathy.  Colonic stool burden suggests constipation.  Normal terminal ileum.  Appendix likely identified on image 63.  No right lower quadrant inflammation.  Normal small bowel without abdominal ascites.  No pelvic adenopathy.  Expected edema and ill-defined subcutaneous gas within the right groin.  No well-defined hematoma.  No pelvic sidewall or retroperitoneal abdominal hematoma.  Contrast within urinary bladder.  A small amount of fluid within the endocervix versus a Nabothian cyst.  Image 79.  4.5 cm low density left adnexal/ovarian lesion on image 73 is likely a cyst. No right adnexal mass. Trace free pelvic fluid is likely physiologic.  No acute osseous abnormality.  Degenerative disc disease at the lumbosacral junction.  Air about the anterior left abdominal wall is likely iatrogenic; image 47.  IMPRESSION:  1.  No evidence of retroperitoneal or pelvic hematoma.  Expected catheterization related edema and gas within the right groin. 2.  Decreased small right pleural effusion with similar small pericardial effusion. 3. No acute process in the abdomen or pelvis. 4. Possible constipation. 5.  Left ovarian low density lesion is most likely a cyst. Especially if there are symptoms of left-sided pelvic pain, further evaluation with non emergent pelvic ultrasound should be considered.  Original Report Authenticated By: Consuello Bossier, M.D.     Medications:     Scheduled Medications:    . carvedilol  6.25 mg Oral BID WC  . enoxaparin  40 mg Subcutaneous Q24H  . ferrous sulfate  325 mg Oral TID WC  . furosemide  40 mg Oral BID  . moxifloxacin  400 mg Oral q1800  . pantoprazole  40 mg Oral Q1200  . potassium chloride  40 mEq Oral BID  . ramipril  5 mg Oral Daily  .  spironolactone  12.5 mg Oral Daily  . DISCONTD: carvedilol  9.375 mg Oral BID WC  . DISCONTD: ferrous sulfate  325 mg Oral BID WC  . DISCONTD: furosemide  80 mg Oral BID  . DISCONTD: spironolactone  25 mg Oral Daily    Infusions:    PRN Medications: acetaminophen, alum & mag hydroxide-simeth, diazepam, docusate sodium, guaiFENesin-dextromethorphan, hydrALAZINE, HYDROcodone-acetaminophen, magnesium hydroxide, morphine injection, ondansetron (ZOFRAN) IV, ondansetron, oxyCODONE-acetaminophen, pramoxine-mineral oil-zinc, SUMAtriptan, traMADol-acetaminophen   Assessment:   1. 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 Severe HTN with associated HF 3. Iron-def anemia, suspect due to heavy menses 4. Migraine HAs   Plan/Discussion:  Appears euvolemic. Continue current HF medications when discharge home. Discussed daily weights, limiting fluid intake  <2 liters per day, and medication compliance. She will follow up next week at the HF clinic with BMET.  She plans to follow up with GYN.   Carvedilol 6.25 mg BID Lisinopril 10 mg daily Lasix 40 mg BID Spironolactone 12.5 mg daily  CLEGG,AMY NP-C 7:54 AM     Patient seen and examined with Tonye Becket, NP. We discussed all aspects of the encounter. I agree with the assessment and plan as stated above.  Ok for d/c today on above meds. Stressed need for medication compliance and f/u in HF clinic. Also stressed need to f/u with gyn to evaluate heavy menstrual periods and placement of IUD to prevent pregnancy with severe cardiomyopathy and need for HF meds which can be teratogenic.  Truman Hayward 8:33 AM

## 2011-06-12 NOTE — Progress Notes (Signed)
Pt refused Norco ordered as part of d/c home meds.  Says it makes her sick.  Dr. Susie Cassette notified and changed med to percocet which pt has been taken at hospital.  Goldie Pea, RN.

## 2011-06-12 NOTE — Progress Notes (Signed)
CSW spoke with Tera Mater, RN Case Manager and patient is aware that she will have to re-apply for Medicaid. Financial Counseling has already started the process. Patient also qualifies for HF meds and this has been set up by CM and by Pharmacist, Lovenia Kim. Clinical Social Worker will sign off for now as social work intervention is no longer needed. Please consult [place] again if new need arises.   Sabino Niemann, MSW, [employer] [phone]

## 2011-06-12 NOTE — Discharge Summary (Addendum)
Physician Discharge Summary  Laurie Wade MRN: [id number] DOB/AGE: 04/03/1980 30 y.o.  PCP: No primary provider on file.   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      diazepam 2 MG tablet   Commonly known as: VALIUM   Take 2.5 tablets (5 mg total) by mouth every 12 (twelve) hours as needed for anxiety (or back pain).      ferrous sulfate 325 (65 FE) MG tablet   Take 1 tablet (325 mg total) by mouth 3 (three) times daily with meals.      furosemide 40 MG tablet   Commonly known as: LASIX   Take 1 tablet (40 mg total) by mouth 2 (two) times daily.      guaiFENesin-dextromethorphan 100-10 MG/5ML syrup   Commonly known as: ROBITUSSIN DM   Take 15 mLs by mouth every 4 (four) hours as needed for cough.      HYDROcodone-acetaminophen 5-325 MG per tablet   Commonly known as: NORCO   Take 1 tablet by mouth every 8 (eight) hours as needed.      lisinopril 10 MG tablet   Commonly known as: PRINIVIL,ZESTRIL   Take 1 tablet (10 mg total) by mouth daily.      moxifloxacin 400 MG tablet   Commonly known as: AVELOX   Take 1 tablet (400 mg total) by mouth daily at 6 PM.      pantoprazole 40 MG tablet   Commonly known as: PROTONIX   Take 1 tablet (40 mg total) by mouth daily at 12 noon.      potassium chloride SA 20 MEQ tablet   Commonly known as: K-DUR,KLOR-CON   Take 2 tablets (40 mEq total) by mouth daily.      spironolactone 12.5 mg Tabs   Commonly known as: ALDACTONE   Take 0.5  tablets (12.5 mg total) by mouth daily.            Discharge Condition: Stable Disposition: Final discharge disposition not confirmed   Consults:  #1 cardiology Dr. Gala Romney #2  Significant Diagnostic Studies: Ct Abdomen Pelvis Wo Contrast  06/10/2011  *RADIOLOGY REPORT*  Clinical Data: Status post cardiac catheterization.  Rule out retroperitoneal bleed.  Hypertension and asthma.  CT ABDOMEN AND PELVIS WITHOUT CONTRAST  Technique:  Multidetector CT imaging of the abdomen and pelvis was performed following the standard protocol without intravenous contrast.  Comparison: Abdominal ultrasound of 06/06/2011  Findings: Bibasilar dependent subsegmental atelectasis.  Moderate cardiomegaly with a small pericardial effusion.  This is similar. Small right pleural effusion is decreased.  Normal uninfused appearance of the liver, spleen, stomach, pancreas. Cholecystectomy without biliary ductal dilatation. Normal adrenal glands.  Minimal contrast within the renal collecting systems and ureters, likely related to prior catheterization.  No retroperitoneal or retrocrural adenopathy.  Colonic stool burden suggests constipation.  Normal terminal ileum.  Appendix likely identified on image 63.  No right lower quadrant inflammation.  Normal small bowel without abdominal  ascites.  No pelvic adenopathy.  Expected edema and ill-defined subcutaneous gas within the right groin.  No well-defined hematoma.  No pelvic sidewall or retroperitoneal abdominal hematoma.  Contrast within urinary bladder.  A small amount of fluid within the endocervix versus a Nabothian cyst.  Image 79.  4.5 cm low density left adnexal/ovarian lesion on image 73 is likely a cyst. No right adnexal mass. Trace free pelvic fluid is likely physiologic.  No acute osseous abnormality.  Degenerative disc disease at the lumbosacral junction.  Air about the anterior left abdominal wall is likely iatrogenic; image 47.  IMPRESSION:  1.  No evidence of  retroperitoneal or pelvic hematoma.  Expected catheterization related edema and gas within the right groin. 2.  Decreased small right pleural effusion with similar small pericardial effusion. 3. No acute process in the abdomen or pelvis. 4. Possible constipation. 5.  Left ovarian low density lesion is most likely a cyst. Especially if there are symptoms of left-sided pelvic pain, further evaluation with non emergent pelvic ultrasound should be considered.  Original Report Authenticated By: Consuello Bossier, M.D.   Dg Chest 2 View  06/06/2011  *RADIOLOGY REPORT*  Clinical Data: Shortness of breath, chest pain, cough  CHEST - 2 VIEW  Comparison: None.  Findings: No pneumonia is seen.  However there are somewhat prominent markings bilaterally with peribronchial thickening.  This could indicate bronchitis, but a viral pneumonia would be difficult to exclude.  There is mild cardiomegaly present.  No bony abnormality is seen.  IMPRESSION:  1.  Somewhat prominent interstitial markings with peribronchial thickening.  Possible bronchitis but cannot exclude viral pneumonia. 2.  Cardiomegaly.  Original Report Authenticated By: Juline Patch, M.D.   Dg Ankle 2 Views Left  06/09/2011  *RADIOLOGY REPORT*  Clinical Data: Medial pain and swelling for 1-1/2 weeks.  No known injury.  LEFT ANKLE - 2 VIEW  Comparison: None.  Findings: Imaged bones, joints and soft tissues appear normal.  IMPRESSION: Negative study.  Original Report Authenticated By: Bernadene Bell. Maricela Curet, M.D.   Ct Angio Chest W/cm &/or Wo Cm  06/07/2011  *RADIOLOGY REPORT*  Clinical Data: Cough with chest pain and shortness of breath. Elevated D-dimer proteinuria with lower extremity swelling.  CT ANGIOGRAPHY CHEST  Technique:  Multidetector CT imaging of the chest using the standard protocol during bolus administration of intravenous contrast. Multiplanar reconstructed images including MIPs were obtained and reviewed to evaluate the vascular anatomy.  Contrast:  OMNIPAQUE IOHEXOL 300 MG/ML  SOLN  Comparison: Chest x-ray earlier today.  Findings: Moderate sized right pleural effusion with smaller significant left effusion.  Cardiomegaly with mild to moderate pericardial effusion.  There are no focal infiltrates or masses. The esophagus is normal except for a apparent tail lodged in the distal esophagus.  Mild stranding left lung base. No visible hilar or mediastinal masses are significant adenopathy.  Good opacification of the pulmonary vasculature and aorta.  No evidence for pulmonary embolic disease.  No significant atherosclerosis of the arch or  great vessels.  Question faint tiny calcification left main coronary artery. Normal thoracic spine and sternum.  No displaced rib fracture.  The Cuts of the upper abdomen are grossly unremarkable.  Compared the prior chest x-ray, the effusions are difficult to visualize.  IMPRESSION: Bilateral pleural effusions, right greater than left along with pericardial effusion.  Cardiomegaly.  No underlying infiltrate, and no evidence for pulmonary emboli. The effusions could be consistent with clinical impression of possible nephrotic syndrome.  Radiopaque density  lodged in distal esophagus, likely pill.  Original Report Authenticated By: Elsie Stain, M.D.   US Renal  06/06/2011  *RADIOLOGY REPORT*  Clinical Data: Proteinuria.  RENAL/URINARY TRACT ULTRASOUND COMPLETE  Comparison:  None.  Findings:  Right Kidney:  Measures 11.2 cm and appears normal without stone, mass or hydronephrosis.  Cortical echogenicity is also normal.  Left Kidney:  Measures 10.6 cm and appears normal without stone, mass or hydronephrosis.  Cortical echogenicity is normal.  Bladder:  Normal appearance.  IMPRESSION: Normal study.  Original Report Authenticated By: Bernadene Bell. Maricela Curet, M.D.      Microbiology: Recent Results (from the past 240 hour(s))  CULTURE, BLOOD (ROUTINE X 2)     Status: Normal (Preliminary result)   Collection Time   06/11/11   7:00 AM      Component Value Range Status Comment   Specimen Description BLOOD LEFT ARM   Final    Special Requests BOTTLES DRAWN AEROBIC AND ANAEROBIC 10CC   Final    Culture  Setup Time [id number]   Final    Culture     Final    Value:        BLOOD CULTURE RECEIVED NO GROWTH TO DATE CULTURE WILL BE HELD FOR 5 DAYS BEFORE ISSUING A FINAL NEGATIVE REPORT   Report Status PENDING   Incomplete   CULTURE, BLOOD (ROUTINE X 2)     Status: Normal (Preliminary result)   Collection Time   06/11/11  7:05 AM      Component Value Range Status Comment   Specimen Description BLOOD RIGHT ARM   Final    Special Requests BOTTLES DRAWN AEROBIC AND ANAEROBIC 10CC   Final    Culture  Setup Time [id number]   Final    Culture     Final    Value:        BLOOD CULTURE RECEIVED NO GROWTH TO DATE CULTURE WILL BE HELD FOR 5 DAYS BEFORE ISSUING A FINAL NEGATIVE REPORT   Report Status PENDING   Incomplete      Labs: Results for orders placed during the hospital encounter of 06/06/11 (from the past 48 hour(s))  VITAMIN B12     Status: Normal   Collection Time   06/10/11 12:47 PM      Component Value Range Comment   Vitamin B-12 331  211 - 911 (pg/mL)   FOLATE     Status: Normal   Collection Time   06/10/11 12:47 PM      Component Value Range Comment   Folate 6.6     IRON AND TIBC     Status: Abnormal   Collection Time   06/10/11 12:47 PM      Component Value Range Comment   Iron 32 (*) 42 - 135 (ug/dL)    TIBC 213  086 - 578 (ug/dL)    Saturation Ratios 7 (*) 20 - 55 (%)    UIBC 398  125 - 400 (ug/dL)   FERRITIN     Status: Normal   Collection Time   06/10/11 12:47 PM      Component Value Range Comment   Ferritin 15  10 - 291 (ng/mL)   RETICULOCYTES     Status: Abnormal   Collection Time   06/10/11 12:47 PM      Component Value Range Comment   Retic Ct Pct 5.1 (*) 0.4 - 3.1 (%)    RBC. 4.28  3.87 - 5.11 (MIL/uL)    Retic Count, Manual 218.3 (*) 19.0 -  186.0 (K/uL)   CBC     Status: Abnormal    Collection Time   06/11/11  6:22 AM      Component Value Range Comment   WBC 7.9  4.0 - 10.5 (K/uL)    RBC 3.95  3.87 - 5.11 (MIL/uL)    Hemoglobin 9.0 (*) 12.0 - 15.0 (g/dL)    HCT 16.1 (*) 09.6 - 46.0 (%)    MCV 82.0  78.0 - 100.0 (fL)    MCH 22.8 (*) 26.0 - 34.0 (pg)    MCHC 27.8 (*) 30.0 - 36.0 (g/dL)    RDW 04.5 (*) 40.9 - 15.5 (%)    Platelets 299  150 - 400 (K/uL)   CULTURE, BLOOD (ROUTINE X 2)     Status: Normal (Preliminary result)   Collection Time   06/11/11  7:00 AM      Component Value Range Comment   Specimen Description BLOOD LEFT ARM      Special Requests BOTTLES DRAWN AEROBIC AND ANAEROBIC 10CC      Culture  Setup Time [id number]      Culture        Value:        BLOOD CULTURE RECEIVED NO GROWTH TO DATE CULTURE WILL BE HELD FOR 5 DAYS BEFORE ISSUING A FINAL NEGATIVE REPORT   Report Status PENDING     CULTURE, BLOOD (ROUTINE X 2)     Status: Normal (Preliminary result)   Collection Time   06/11/11  7:05 AM      Component Value Range Comment   Specimen Description BLOOD RIGHT ARM      Special Requests BOTTLES DRAWN AEROBIC AND ANAEROBIC 10CC      Culture  Setup Time [id number]      Culture        Value:        BLOOD CULTURE RECEIVED NO GROWTH TO DATE CULTURE WILL BE HELD FOR 5 DAYS BEFORE ISSUING A FINAL NEGATIVE REPORT   Report Status PENDING     BASIC METABOLIC PANEL     Status: Abnormal   Collection Time   06/11/11 10:00 AM      Component Value Range Comment   Sodium 142  135 - 145 (mEq/L)    Potassium 4.1  3.5 - 5.1 (mEq/L)    Chloride 105  96 - 112 (mEq/L)    CO2 27  19 - 32 (mEq/L)    Glucose, Bld 104 (*) 70 - 99 (mg/dL)    BUN 9  6 - 23 (mg/dL)    Creatinine, Ser 6.29  0.50 - 1.10 (mg/dL)    Calcium 8.8  8.4 - 10.5 (mg/dL)    GFR calc non Af Amer 85 (*) >90 (mL/min)    GFR calc Af Amer >90  >90 (mL/min)   BASIC METABOLIC PANEL     Status: Abnormal   Collection Time   06/12/11  6:10 AM      Component Value Range Comment   Sodium 140  135 - 145 (mEq/L)     Potassium 4.1  3.5 - 5.1 (mEq/L)    Chloride 105  96 - 112 (mEq/L)    CO2 30  19 - 32 (mEq/L)    Glucose, Bld 95  70 - 99 (mg/dL)    BUN 10  6 - 23 (mg/dL)    Creatinine, Ser 5.28  0.50 - 1.10 (mg/dL)    Calcium 8.7  8.4 - 10.5 (mg/dL)    GFR calc non Af Amer 86 (*) >90 (  mL/min)    GFR calc Af Amer >90  >90 (mL/min)      HPI :31 yo female with no significant PMH presents to [facility] ED with main concerns of progressively worsening lower extremity swelling and shortness of breath over the last two weeks. She was evaluated for possible nephrotic syndrome. Renal consult was obtained and suggested that this is not nephrotic syndrome. She underwent imaging showing cardiomegaly, it was followed up with a 2 D Echo showing severely depressed LV EF of 15% and an elevated pro bnp. Cardiology consult was obtained, and she was transferred to [facility] cone for further evaluation. 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 Currently she is on diuretics, with a plan to d/c in am.   HOSPITAL COURSE: * Acute respiratory failure from Acute Diastolic/Systolic CHF  - d-dimer elevated, CT angio confirmed bilateral pleural effusions with cardiomegaly, no PE   2 D ECHO showing LVEF of 15% and akinesis of the anteroseptal and apical myocardium. And consistent with hight ventricular filling pressure.  -pro bnp was elevated  -started on iv diuretics, transitioned to po lasix  -Daily weights  -I/O's  - CArdiac Cath showed NICM, normal coronaries.  moderately elevated biventricular pressures, ejection fraction appeared to be better than the echocardiogram of about 30-35%. Recommended to continue Carvedilol 6.25 mg BID  Lisinopril 10 mg daily  Lasix 40 mg BID  Spironolactone 12.5 mg daily Okay to discharge from a cardiology standpoint    HTN, accelaerated  - better controlled. Continue above regimen  Hypokalemia  .Normal, change  potassium to 40 by mouth daily and check BMP in one week  Proteinuria  - unclear etiology, mild degree in the setting on normal kidney function tests, creatinine and GFR  Autoimmune workup was negative  - Renal US is also unremarkable for acute events  - spoke with Dr. Darrick Penna and he had agreed with autoimmune work up which is unremarkable so far  - pt is maintaining good urine output, continue ACE inhibitor   Shortness of breath empiric antibiotics was initiated and the patient is being treated with Avelox for presumed bronchitis but she will continue for another 5 days   Left ankle pain: uric acid level normal. Pain improved.  X ray of the left ankle negative for fluid, doubt septic arthritis   Anemia:  - probably related to heavy menstrual bleeding.  -Anemia panel showed iron deficiency anemia.  -Ct abd and pelvis no abd hematoma.  -She reports that she had tubal ligation after her last pregnancy. She does not want IUD implant. gyn to evaluate heavy menstrual periods and placement of IUD to prevent pregnancy with severe cardiomyopathy and need for HF meds which can be teratogenic. CBC in one week    Migraine headache: Imitrex discontinued because of cardiomyopathy and hypertension, a prescription for Vicodin is being provided   Discharge Exam:  Blood pressure 115/79, pulse 85, temperature 97.7 F (36.5 C), temperature source Oral, resp. rate 20, height 5\' 6"  (1.676 m), weight 80.287 kg (177 lb), last menstrual period 06/08/2011, SpO2 99.00%.  General: Well appearing. No resp difficulty  HEENT: normal  Neck: supple. JVP 7-8. Carotids 2+ bilat; no bruits. No lymphadenopathy or thryomegaly appreciated.  Cor: PMI nondisplaced. Regular rate & rhythm. +s3  Lungs: clear  Abdomen: soft, nontender, nondistended. No hepatosplenomegaly. No bruits or masses. Good bowel sounds.  Extremities: no cyanosis, clubbing, rash, No edema R groin no ecchymosis  Neuro: alert &  orientedx3,  cranial nerves grossly intact. moves all 4 extremities w/o difficulty. Flat affect.     Discharge Orders    Future Appointments: Provider: Department: Dept Phone: Center:   06/19/2011 9:30 AM [facility] [phone] None     Future Orders Please Complete By Expires   Diet - low sodium heart healthy      Scheduling Instructions:   Fluid restriction to 1500 cc per day Daily weights    Increase activity slowly      Call MD for:  difficulty breathing, headache or visual disturbances      Call MD for:  persistant dizziness or light-headedness      Call MD for:  extreme fatigue         Follow-up Information    Follow up with Arvilla Meres, MD on 06/19/2011. (at 9:30 Garage Code [id number] )    Contact information:   [address] [place] [address] [phone]       Follow up with PCP. (Posthospitalization followup BMP in one week)          Signed: Richarda Overlie 06/12/2011, 11:28 AM

## 2011-06-17 LAB — CULTURE, BLOOD (ROUTINE X 2)
Culture  Setup Time: 201306041434
Culture  Setup Time: 201306041434
Culture: NO GROWTH
Culture: NO GROWTH

## 2011-06-19 ENCOUNTER — Ambulatory Visit (HOSPITAL_COMMUNITY): Admit: 2011-06-19 | Payer: Self-pay

## 2011-07-02 ENCOUNTER — Telehealth (HOSPITAL_COMMUNITY): Payer: Self-pay | Admitting: *Deleted

## 2011-07-02 NOTE — Telephone Encounter (Signed)
Pt c/o having a headache everyday, she states she wakes up with it and takes either ibuprofen or tylenol but doesn't really get any relief, she has no way of checking her BP but states she has been taking all meds as prescribed, appt scheduled for 9 am tomorrow

## 2011-07-02 NOTE — Telephone Encounter (Signed)
Laurie Wade called today. She was a no show for her 6-12 appt and wants to reschedule but is also having headaches and may need to be seen sooner. Thanks.

## 2011-07-03 ENCOUNTER — Telehealth (HOSPITAL_COMMUNITY): Payer: Self-pay | Admitting: Adult Health

## 2011-07-03 ENCOUNTER — Ambulatory Visit (HOSPITAL_COMMUNITY)
Admission: RE | Admit: 2011-07-03 | Discharge: 2011-07-03 | Disposition: A | Discharge status: Home / Self Care | Payer: Medicaid Other | Source: Ambulatory Visit | Attending: Internal Medicine | Admitting: Internal Medicine

## 2011-07-03 VITALS — BP 168/108 | HR 98 | Wt 182.0 lb

## 2011-07-03 DIAGNOSIS — I5022 Chronic systolic (congestive) heart failure: Secondary | ICD-10-CM | POA: Insufficient documentation

## 2011-07-03 DIAGNOSIS — I10 Essential (primary) hypertension: Secondary | ICD-10-CM | POA: Insufficient documentation

## 2011-07-03 DIAGNOSIS — Z9119 Patient's noncompliance with other medical treatment and regimen: Secondary | ICD-10-CM | POA: Insufficient documentation

## 2011-07-03 DIAGNOSIS — Z91199 Patient's noncompliance with other medical treatment and regimen due to unspecified reason: Secondary | ICD-10-CM | POA: Insufficient documentation

## 2011-07-03 DIAGNOSIS — J45909 Unspecified asthma, uncomplicated: Secondary | ICD-10-CM | POA: Insufficient documentation

## 2011-07-03 DIAGNOSIS — R519 Headache, unspecified: Secondary | ICD-10-CM | POA: Insufficient documentation

## 2011-07-03 DIAGNOSIS — I509 Heart failure, unspecified: Secondary | ICD-10-CM | POA: Insufficient documentation

## 2011-07-03 DIAGNOSIS — R51 Headache: Secondary | ICD-10-CM | POA: Insufficient documentation

## 2011-07-03 LAB — BASIC METABOLIC PANEL
BUN: 12 mg/dL (ref 6–23)
CO2: 25 mEq/L (ref 19–32)
Calcium: 9.3 mg/dL (ref 8.4–10.5)
Chloride: 105 mEq/L (ref 96–112)
Creatinine, Ser: 1.17 mg/dL — ABNORMAL HIGH (ref 0.50–1.10)

## 2011-07-03 MED ORDER — CARVEDILOL 6.25 MG PO TABS: 9.3750 mg | ORAL_TABLET | Freq: Two times a day (BID) | ORAL | Status: DC

## 2011-07-03 NOTE — Patient Instructions (Addendum)
Take Carvedilol 9.375 mg twice a day (1 1/2 tabs)  Do the following things EVERYDAY: 1) Weigh yourself in the morning before breakfast. Write it down and keep it in a log. 2) Take your medicines as prescribed 3) Eat low salt foods--Limit salt (sodium) to 2000 mg per day.  4) Stay as active as you can everyday 5) Limit all fluids for the day to less than 2 liters  Follow up in 2 weeks

## 2011-07-03 NOTE — Assessment & Plan Note (Addendum)
Functionally doing well . Ambulating with out dyspnea. Volume status mildly elevated. Weight up 5 pounds since discharge. SBP >130 DBP >100. Will not titrate ace inhibitor because she missed her initial follow up appointment and I am not sure she will follow up. Increase beta blocker today to 9.375 mg twice daily. Lengthy discussion(> than 25 minutes) about medication compliance, low salt food choices, and limiting fluids to less 2 liters per day.  I have stressed the importance of ongoing follow up at HF clinic improve her EF. If  EF remains less than 35% with mediation titration will need to refer for ICD. Provided scale and weight chart. Check BMET today.  Follow up in 2 weeks.

## 2011-07-03 NOTE — Progress Notes (Signed)
Patient ID: Laurie Wade, female   DOB: [birth year], 31 y.o.   MRN: [id number] HPI: 31 year old female with a history of HTN and noncompliance associated with HTN medications. New diagnosis systolic heart failure due to hypertension. She has had tubal ligation in the past.   ECHO EF 15%  06/11/11 LHC/RHC Normal Coronaries.  RA = 18  RV = 54/11 (19)  PA = 54/28 (37)  PCW = 26  Fick cardiac output/index = 7.5/3.9  Thermo CO/CI = 6.5/3.4  PVR = 1.5 [place]  SVR =  FA sat = 97%  PA sat = 64%, 69%  SVC sat = 73%   D/C from [facility] 06/12/11 after being treated for acute systolic heart failure. D/C weight 177 pounds. D/C medications: Carvedilol 6.25 mg BID Lisinopril 10 mg daily Lasix 40 mg BID Spironolactone 12.5 mg daily. She cancelled initial post hospital follow up.   She returns for post hospital follow up. Complains of headaches but not as bad as in the hospital. She requested oxycodone. Denies SOB/PND/Orthopnea. Tries to weigh but scale not working at home.  Missed one day of medications. Drinking more than 2 liters. Medicaid application pending.    ROS: All systems negative except as listed in HPI, PMH and Problem List.  Past Medical History  Diagnosis Date  . Hypertension   . Asthma     Current Outpatient Prescriptions  Medication Sig Dispense Refill  . carvedilol (COREG) 6.25 MG tablet Take 1 tablet (6.25 mg total) by mouth 2 (two) times daily with a meal.  60 tablet  2  . ferrous sulfate 325 (65 FE) MG tablet Take 1 tablet (325 mg total) by mouth 3 (three) times daily with meals.  60 tablet  2  . furosemide (LASIX) 40 MG tablet Take 1 tablet (40 mg total) by mouth 2 (two) times daily.  120 tablet  2  . lisinopril (PRINIVIL,ZESTRIL) 10 MG tablet Take 1 tablet (10 mg total) by mouth daily.  30 tablet  2  . pantoprazole (PROTONIX) 40 MG tablet Take 1 tablet (40 mg total) by mouth daily at 12 noon.  60 tablet  2  . potassium chloride SA (K-DUR,KLOR-CON) 20 MEQ tablet Take 2 tablets (40 mEq  total) by mouth daily.  30 tablet  2  . spironolactone (ALDACTONE) 12.5 mg TABS Take 0.5 tablets (12.5 mg total) by mouth daily.  60 tablet  2     PHYSICAL EXAM: Filed Vitals:   07/03/11 0902  BP: 168/108  Pulse: 98   Weight change:  182 (D/C weight 177 pounds)  General:  Well appearing. No resp difficulty HEENT: normal Neck: supple. JVP 7-8 Carotids 2+ bilaterally; no bruits. No lymphadenopathy or thryomegaly appreciated. Cor: PMI normal. Regular rate & rhythm. No rubs,  Murmurs. S3 Lungs: clear Abdomen: soft, nontender, nondistended. No hepatosplenomegaly. No bruits or masses. Good bowel sounds. Extremities: no cyanosis, clubbing, rash, 1+ edema Neuro: alert & orientedx3, cranial nerves grossly intact. Moves all 4 extremities w/o difficulty. Affect pleasant.       ASSESSMENT & PLAN:

## 2011-07-03 NOTE — Assessment & Plan Note (Signed)
SBP >160 DBP >100. Blood pressure well controled in the hospital . I am unsure if she is complaint with medications. Increased Carvedilol today.

## 2011-07-03 NOTE — Assessment & Plan Note (Addendum)
Patient request oxycodone for headache. I informed her narcotics are not prescribed in HF clinic. I have told her to follow up with primary care MD for headaches.

## 2011-07-03 NOTE — Telephone Encounter (Signed)
Left voice mail. Lab work stable. Instructed to call back with questions.

## 2011-07-18 ENCOUNTER — Ambulatory Visit (HOSPITAL_COMMUNITY): Payer: Self-pay

## 2011-07-19 ENCOUNTER — Ambulatory Visit (HOSPITAL_COMMUNITY): Admission: RE | Admit: 2011-07-19 | Payer: MEDICAID | Source: Ambulatory Visit

## 2011-10-29 ENCOUNTER — Encounter (HOSPITAL_COMMUNITY): Payer: Self-pay | Admitting: *Deleted

## 2011-10-29 ENCOUNTER — Emergency Department (HOSPITAL_COMMUNITY)
Admission: EM | Admit: 2011-10-29 | Discharge: 2011-10-30 | Disposition: A | Discharge status: Home / Self Care | Payer: Medicaid Other | Attending: Emergency Medicine | Admitting: Emergency Medicine

## 2011-10-29 DIAGNOSIS — J45909 Unspecified asthma, uncomplicated: Secondary | ICD-10-CM | POA: Insufficient documentation

## 2011-10-29 DIAGNOSIS — I10 Essential (primary) hypertension: Secondary | ICD-10-CM | POA: Insufficient documentation

## 2011-10-29 DIAGNOSIS — Z87891 Personal history of nicotine dependence: Secondary | ICD-10-CM | POA: Insufficient documentation

## 2011-10-29 DIAGNOSIS — Z79899 Other long term (current) drug therapy: Secondary | ICD-10-CM | POA: Insufficient documentation

## 2011-10-29 LAB — POCT I-STAT, CHEM 8
BUN: 8 mg/dL (ref 6–23)
Calcium, Ion: 1.22 mmol/L (ref 1.12–1.23)
Creatinine, Ser: 0.8 mg/dL (ref 0.50–1.10)
Glucose, Bld: 97 mg/dL (ref 70–99)
Hemoglobin: 11.6 g/dL — ABNORMAL LOW (ref 12.0–15.0)
TCO2: 24 mmol/L (ref 0–100)

## 2011-10-29 MED ORDER — LISINOPRIL 10 MG PO TABS
10.0000 mg | ORAL_TABLET | Freq: Once | ORAL | Status: AC
  Administered 2011-10-30: 10 mg via ORAL
  Filled 2011-10-29 (×2): qty 1

## 2011-10-29 NOTE — ED Provider Notes (Signed)
History     CSN: [id number]  Arrival date & time 10/29/11  1650   First MD Initiated Contact with Patient 10/29/11 2309      Chief Complaint  Patient presents with  . Hypertension    (Consider location/radiation/quality/duration/timing/severity/associated sxs/prior treatment) Patient is a 31 y.o. female presenting with hypertension. The history is provided by the patient.  Hypertension Pertinent negatives include no chest pain, no abdominal pain and no shortness of breath.   31 year old, female, with no significant past medical history presents emergency department complaining of pain in the upper area of her right thigh.  For several weeks.  She had gone to see her physician about this and he noted that she had an elevated blood pressure, which he was concerned about, so, he sent her to the emergency department for evaluation.  She denies a history of trauma.  She denies lower extremity, swelling, recent travel or surgery.  She denies chest pain, or shortness of breath.  She has not had a fever, chills, or rash.  She denies urinary tract symptoms, vaginal discharge or vaginal.  Past Medical History  Diagnosis Date  . Hypertension   . Asthma     Past Surgical History  Procedure Date  . Cesarean section   . Cholecystectomy   . Tubal ligation     Family History  Problem Relation Age of Onset  . Heart failure Mother   . Hypertension Mother     History  Substance Use Topics  . Smoking status: Former Games developer  . Smokeless tobacco: Former Neurosurgeon    Quit date: 06/06/2006  . Alcohol Use: No    OB History    Grav Para Term Preterm Abortions TAB SAB Ect Mult Living                  Review of Systems  Constitutional: Negative for fever.  Respiratory: Negative for cough and shortness of breath.   Cardiovascular: Negative for chest pain.  Gastrointestinal: Negative for nausea, vomiting and abdominal pain.  Genitourinary: Negative for dysuria.  Musculoskeletal:       Pain  in right upper thigh, just below the inguinal ligament  Skin: Negative for rash.  Neurological: Negative for weakness.  Hematological: Does not bruise/bleed easily.  Psychiatric/Behavioral: Negative for confusion.  All other systems reviewed and are negative.    Allergies  Doxycycline  Home Medications   Current Outpatient Rx  Name Route Sig Dispense Refill  . CARVEDILOL 6.25 MG PO TABS Oral Take 1.5 tablets (9.375 mg total) by mouth 2 (two) times daily with a meal. 90 tablet 2  . DIAZEPAM 2 MG PO TABS Oral Take 2 mg by mouth every 6 (six) hours as needed. For anxiety.    Di Kindle SULFATE 325 (65 FE) MG PO TABS Oral Take 1 tablet (325 mg total) by mouth 3 (three) times daily with meals. 60 tablet 2  . FUROSEMIDE 40 MG PO TABS Oral Take 1 tablet (40 mg total) by mouth 2 (two) times daily. 120 tablet 2  . LISINOPRIL 10 MG PO TABS Oral Take 1 tablet (10 mg total) by mouth daily. 30 tablet 2  . OXYCODONE-ACETAMINOPHEN 5-325 MG PO TABS Oral Take 1 tablet by mouth every 4 (four) hours as needed. For pain,    . POTASSIUM CHLORIDE CRYS ER 20 MEQ PO TBCR Oral Take 2 tablets (40 mEq total) by mouth daily. 30 tablet 2  . SPIRONOLACTONE 12.5 MG HALF TABLET Oral Take 0.5 tablets (12.5 mg total) by  mouth daily. 60 tablet 2    BP 182/112  Pulse 88  Temp 98.7 F (37.1 C) (Oral)  Resp 25  SpO2 100%  LMP 10/17/2011  Physical Exam  Constitutional: She is oriented to person, place, and time. She appears well-developed and well-nourished. No distress.  HENT:  Head: Normocephalic and atraumatic.  Eyes: Conjunctivae normal and EOM are normal.  Neck: Normal range of motion. Neck supple.  Cardiovascular: Regular rhythm.  Exam reveals gallop.   No murmur heard.      Tachycardia. HR 100-105 on monitor  Pulmonary/Chest: Effort normal and breath sounds normal.  Abdominal: Soft. Bowel sounds are normal. There is no tenderness.  Musculoskeletal: Normal range of motion. She exhibits tenderness. She  exhibits no edema.       Minimal localized ttp just inferior to right inguinal lig. No swelling, color change. No masses.  Neurological: She is alert and oriented to person, place, and time.  Skin: Skin is warm and dry.  Psychiatric: She has a normal mood and affect. Thought content normal.    ED Course  Procedures (including critical care time)  Labs Reviewed  POCT I-STAT, CHEM 8 - Abnormal; Notable for the following:    Potassium 3.1 (*)     Hemoglobin 11.6 (*)     HCT 34.0 (*)     All other components within normal limits  D-DIMER, QUANTITATIVE   No results found.   No diagnosis found.    MDM  htn - no evidence of end organ damage Leg pain -         Cheri Guppy, MD 10/29/11 [phone]

## 2011-10-29 NOTE — ED Notes (Signed)
Pt went to md about right groin pain and was told bp was elevated,  Pt has had right groin pain for 2-3 weeks.  Pt does have a headache for the last 20-30 minutes

## 2011-10-30 MED ORDER — METOPROLOL SUCCINATE ER 25 MG PO TB24: 50.0000 mg | ORAL_TABLET | Freq: Every day | ORAL | Status: DC

## 2011-10-30 MED ORDER — TRAMADOL HCL 50 MG PO TABS: 50.0000 mg | ORAL_TABLET | Freq: Four times a day (QID) | ORAL | Status: DC | PRN

## 2011-10-30 MED ORDER — OXYCODONE-ACETAMINOPHEN 5-325 MG PO TABS
2.0000 | ORAL_TABLET | Freq: Once | ORAL | Status: AC
  Administered 2011-10-30: 2 via ORAL
  Filled 2011-10-30: qty 2

## 2011-12-12 ENCOUNTER — Emergency Department (HOSPITAL_COMMUNITY)
Admission: EM | Admit: 2011-12-12 | Discharge: 2011-12-12 | Disposition: A | Discharge status: Home / Self Care | Payer: Medicaid Other | Attending: Emergency Medicine | Admitting: Emergency Medicine

## 2011-12-12 ENCOUNTER — Emergency Department (HOSPITAL_COMMUNITY): Payer: Medicaid Other

## 2011-12-12 ENCOUNTER — Encounter (HOSPITAL_COMMUNITY): Payer: Self-pay

## 2011-12-12 DIAGNOSIS — Z79899 Other long term (current) drug therapy: Secondary | ICD-10-CM | POA: Insufficient documentation

## 2011-12-12 DIAGNOSIS — X58XXXA Exposure to other specified factors, initial encounter: Secondary | ICD-10-CM | POA: Insufficient documentation

## 2011-12-12 DIAGNOSIS — M79609 Pain in unspecified limb: Secondary | ICD-10-CM

## 2011-12-12 DIAGNOSIS — Y939 Activity, unspecified: Secondary | ICD-10-CM | POA: Insufficient documentation

## 2011-12-12 DIAGNOSIS — S76919A Strain of unspecified muscles, fascia and tendons at thigh level, unspecified thigh, initial encounter: Secondary | ICD-10-CM

## 2011-12-12 DIAGNOSIS — M79606 Pain in leg, unspecified: Secondary | ICD-10-CM

## 2011-12-12 DIAGNOSIS — J45909 Unspecified asthma, uncomplicated: Secondary | ICD-10-CM | POA: Insufficient documentation

## 2011-12-12 DIAGNOSIS — IMO0002 Reserved for concepts with insufficient information to code with codable children: Secondary | ICD-10-CM | POA: Insufficient documentation

## 2011-12-12 DIAGNOSIS — Y929 Unspecified place or not applicable: Secondary | ICD-10-CM | POA: Insufficient documentation

## 2011-12-12 DIAGNOSIS — Z87891 Personal history of nicotine dependence: Secondary | ICD-10-CM | POA: Insufficient documentation

## 2011-12-12 DIAGNOSIS — I10 Essential (primary) hypertension: Secondary | ICD-10-CM | POA: Insufficient documentation

## 2011-12-12 MED ORDER — OXYCODONE-ACETAMINOPHEN 5-325 MG PO TABS: 1.0000 | ORAL_TABLET | ORAL | Status: DC | PRN

## 2011-12-12 MED ORDER — OXYCODONE-ACETAMINOPHEN 5-325 MG PO TABS
2.0000 | ORAL_TABLET | Freq: Once | ORAL | Status: AC
  Administered 2011-12-12: 2 via ORAL
  Filled 2011-12-12: qty 2

## 2011-12-12 MED ORDER — NAPROXEN 500 MG PO TABS: 500.0000 mg | ORAL_TABLET | Freq: Two times a day (BID) | ORAL | Status: DC

## 2011-12-12 MED ORDER — CYCLOBENZAPRINE HCL 10 MG PO TABS: 10.0000 mg | ORAL_TABLET | Freq: Two times a day (BID) | ORAL | Status: DC | PRN

## 2011-12-12 NOTE — ED Provider Notes (Signed)
History     CSN: [id number]  Arrival date & time 12/12/11  1143   First MD Initiated Contact with Patient 12/12/11 1319      No chief complaint on file.   (Consider location/radiation/quality/duration/timing/severity/associated sxs/prior treatment) Patient is a 31 y.o. female presenting with groin pain. The history is provided by the patient (pt complains of right groin pain for weeks). No language interpreter was used.  Groin Pain This is a new problem. The current episode started more than 1 week ago. The problem occurs constantly. The problem has not changed since onset.Pertinent negatives include no chest pain, no abdominal pain and no headaches. The symptoms are aggravated by bending. Nothing relieves the symptoms. She has tried nothing for the symptoms. The treatment provided moderate relief.    Past Medical History  Diagnosis Date  . Hypertension   . Asthma     Past Surgical History  Procedure Date  . Cesarean section   . Cholecystectomy   . Tubal ligation     Family History  Problem Relation Age of Onset  . Heart failure Mother   . Hypertension Mother     History  Substance Use Topics  . Smoking status: Former Games developer  . Smokeless tobacco: Former Neurosurgeon    Quit date: 06/06/2006  . Alcohol Use: No    OB History    Grav Para Term Preterm Abortions TAB SAB Ect Mult Living                  Review of Systems  Constitutional: Negative for fatigue.  HENT: Negative for congestion, sinus pressure and ear discharge.   Eyes: Negative for discharge.  Respiratory: Negative for cough.   Cardiovascular: Negative for chest pain.  Gastrointestinal: Negative for abdominal pain and diarrhea.  Genitourinary: Negative for frequency and hematuria.  Musculoskeletal: Negative for back pain.       Pain in right groin  Skin: Negative for rash.  Neurological: Negative for seizures and headaches.  Hematological: Negative.   Psychiatric/Behavioral: Negative for hallucinations.     Allergies  Doxycycline  Home Medications   Current Outpatient Rx  Name  Route  Sig  Dispense  Refill  . CARVEDILOL 6.25 MG PO TABS   Oral   Take 6.25 mg by mouth 2 (two) times daily with a meal.         . DIAZEPAM 2 MG PO TABS   Oral   Take 2 mg by mouth every 6 (six) hours as needed. For anxiety.         Di Kindle SULFATE 325 (65 FE) MG PO TABS   Oral   Take 325 mg by mouth 3 (three) times daily with meals.         . FUROSEMIDE 40 MG PO TABS   Oral   Take 40 mg by mouth 2 (two) times daily.         Marland Kitchen LISINOPRIL 10 MG PO TABS   Oral   Take 10 mg by mouth daily.         Marland Kitchen METOPROLOL SUCCINATE ER 25 MG PO TB24   Oral   Take 50 mg by mouth daily.         . OXYCODONE-ACETAMINOPHEN 5-325 MG PO TABS   Oral   Take 1 tablet by mouth every 4 (four) hours as needed. For pain,         . POTASSIUM CHLORIDE CRYS ER 20 MEQ PO TBCR   Oral   Take 40 mEq by  mouth daily.           BP 162/107  Pulse 92  Temp 98.7 F (37.1 C) (Oral)  Resp 16  SpO2 98%  LMP 11/18/2011  Physical Exam  Constitutional: She is oriented to person, place, and time. She appears well-developed.  HENT:  Head: Normocephalic and atraumatic.  Eyes: Conjunctivae normal and EOM are normal. No scleral icterus.  Neck: Neck supple. No thyromegaly present.  Cardiovascular: Normal rate and regular rhythm.  Exam reveals no gallop and no friction rub.   No murmur heard. Pulmonary/Chest: No stridor. She has no wheezes. She has no rales. She exhibits no tenderness.  Abdominal: She exhibits no distension. There is no tenderness. There is no rebound.  Musculoskeletal: Normal range of motion. She exhibits no edema.       Mild tenderness right groin and thigh  Lymphadenopathy:    She has no cervical adenopathy.  Neurological: She is oriented to person, place, and time. Coordination normal.  Skin: No rash noted. No erythema.  Psychiatric: She has a normal mood and affect. Her behavior is normal.     ED Course  Procedures (including critical care time)  Labs Reviewed - No data to display No results found.   No diagnosis found.    MDM          Benny Lennert, MD 12/12/11 [phone]

## 2011-12-12 NOTE — ED Notes (Signed)
Pt presents with 1 month h/o R groin pain.  Pt denies any injury, reports pain is constant and radiates down R leg.  Pt denies any swelling to area.

## 2011-12-12 NOTE — Progress Notes (Signed)
*  PRELIMINARY RESULTS* Vascular Ultrasound Right lower extremity venous duplex has been completed.  Preliminary findings: Right:  No evidence of DVT, superficial thrombosis, or Baker's cyst.   Farrel Demark, RDMS, RVT 12/12/2011, 5:06 PM

## 2011-12-12 NOTE — ED Provider Notes (Signed)
Medical screening examination/treatment/procedure(s) were performed by non-physician practitioner and as supervising physician I was immediately available for consultation/collaboration.   Robyn Galati L Jasai Sorg, MD 12/12/11 2323 

## 2011-12-12 NOTE — ED Provider Notes (Signed)
Pt moved to the CDU awaiting  X-rays and vascular studies after presenting to the ER c/o groin pain.  Pt evaluated by Dr Estell Harpin.  Plan: if all studies are negative the patient may be discharged home with pain management and outpatient follow up.  BP 166/105  Pulse 94  Temp 98.7 F (37.1 C) (Oral)  Resp 18  SpO2 100%  LMP 11/18/2011   On exam: hemodynamically stable, NAD, heart w/ RRR, lungs CTAB, Chest & abd non-tender, no peripheral edema or calf tenderness.  Pt. Currently c/o 10/10 pain in her groin and is found to be hypertensive on vital sign review. Will give percocet and re-evaluate.    7:08 PM Vascular Ultrasound  Right lower extremity venous duplex has been completed. Preliminary findings: Right: No evidence of DVT, superficial thrombosis, or Baker's cyst.  X-ray R hip: No evidence for acute abnormality.   I discussed these findings with the patient as well as the likelihood that this is a strain of her hip/groin muscles.  I recommended that she followup with her primary care physician. She states she is in the process of trying to find a new family practitioner. I discussed the use of the resource guide to find a new primary care physician who will take her insurance. Will DC home with pain control and muscle relaxers.  Patient angulatory without difficulty here in the department.  1. Medications: Percocet, Flexeril, usual home medications 2. Treatment: rest, drink plenty of fluids, take medications as prescribed, gentle stretching exercises as discussed 3. Follow Up: Please followup with your primary doctor for discussion of your diagnoses and further evaluation after today's visit; if you do not have a primary care doctor use the resource guide provided to find one   Dierdre Forth, PA-C 12/12/11 2042

## 2011-12-12 NOTE — ED Notes (Signed)
Pt c/o 8/10 shooting pain from right groin down leg to right foot since 1 month ago, no relief with ibuprofen. Pt A&Ox4, ambulatory, nad.

## 2011-12-17 ENCOUNTER — Encounter (HOSPITAL_COMMUNITY): Payer: 59

## 2012-02-08 ENCOUNTER — Emergency Department (HOSPITAL_COMMUNITY)
Admission: EM | Admit: 2012-02-08 | Discharge: 2012-02-08 | Disposition: A | Discharge status: Home / Self Care | Payer: Medicaid Other | Attending: Emergency Medicine | Admitting: Emergency Medicine

## 2012-02-08 ENCOUNTER — Encounter (HOSPITAL_COMMUNITY): Payer: Self-pay

## 2012-02-08 DIAGNOSIS — R350 Frequency of micturition: Secondary | ICD-10-CM | POA: Insufficient documentation

## 2012-02-08 DIAGNOSIS — Z791 Long term (current) use of non-steroidal anti-inflammatories (NSAID): Secondary | ICD-10-CM | POA: Insufficient documentation

## 2012-02-08 DIAGNOSIS — Z79899 Other long term (current) drug therapy: Secondary | ICD-10-CM | POA: Insufficient documentation

## 2012-02-08 DIAGNOSIS — J45909 Unspecified asthma, uncomplicated: Secondary | ICD-10-CM | POA: Insufficient documentation

## 2012-02-08 DIAGNOSIS — Z87891 Personal history of nicotine dependence: Secondary | ICD-10-CM | POA: Insufficient documentation

## 2012-02-08 DIAGNOSIS — Z3202 Encounter for pregnancy test, result negative: Secondary | ICD-10-CM | POA: Insufficient documentation

## 2012-02-08 DIAGNOSIS — M545 Low back pain, unspecified: Secondary | ICD-10-CM | POA: Insufficient documentation

## 2012-02-08 DIAGNOSIS — I10 Essential (primary) hypertension: Secondary | ICD-10-CM | POA: Insufficient documentation

## 2012-02-08 LAB — URINALYSIS, ROUTINE W REFLEX MICROSCOPIC
Bilirubin Urine: NEGATIVE
Glucose, UA: NEGATIVE mg/dL
Hgb urine dipstick: NEGATIVE
Specific Gravity, Urine: 1.007 (ref 1.005–1.030)
pH: 6 (ref 5.0–8.0)

## 2012-02-08 LAB — PREGNANCY, URINE: Preg Test, Ur: NEGATIVE

## 2012-02-08 MED ORDER — HYDROCODONE-ACETAMINOPHEN 5-325 MG PO TABS: 1.0000 | ORAL_TABLET | Freq: Four times a day (QID) | ORAL | Status: DC | PRN

## 2012-02-08 MED ORDER — HYDROCODONE-ACETAMINOPHEN 5-325 MG PO TABS
2.0000 | ORAL_TABLET | Freq: Once | ORAL | Status: AC
  Administered 2012-02-08: 2 via ORAL
  Filled 2012-02-08: qty 2

## 2012-02-08 NOTE — ED Notes (Signed)
Pt with c/o lower back pain and urinary frequency for one week

## 2012-02-08 NOTE — ED Provider Notes (Signed)
History     CSN: [id number]  Arrival date & time 02/08/12  0740   First MD Initiated Contact with Patient 02/08/12 [phone]      Chief Complaint  Patient presents with  . Flank Pain    (Consider location/radiation/quality/duration/timing/severity/associated sxs/prior treatment) Patient is a 32 y.o. female presenting with flank pain. The history is provided by the patient.  Flank Pain Pertinent negatives include no chest pain, no abdominal pain, no headaches and no shortness of breath.  pt c/o low back pain for past week. Constant. Dull. Moderate. Non radiating. Worse w palp, position changes, bending. No radiation or groin/abdominal pain. No anterior/abdominal or pelvic pain. No dysuria. States mild urine urgency. No hematuria. No hx kidney stones. Hx back pain. Denies injury or strain. Having normal periods, lnmp 2 weeks ago. No vaginal discharge or bleeding. No fever or chills.     Past Medical History  Diagnosis Date  . Hypertension   . Asthma     Past Surgical History  Procedure Date  . Cesarean section   . Cholecystectomy   . Tubal ligation     Family History  Problem Relation Age of Onset  . Heart failure Mother   . Hypertension Mother     History  Substance Use Topics  . Smoking status: Former Games developer  . Smokeless tobacco: Former Neurosurgeon    Quit date: 06/06/2006  . Alcohol Use: No    OB History    Grav Para Term Preterm Abortions TAB SAB Ect Mult Living                  Review of Systems  Constitutional: Negative for fever and chills.  HENT: Negative for neck pain.   Eyes: Negative for redness.  Respiratory: Negative for shortness of breath.   Cardiovascular: Negative for chest pain.  Gastrointestinal: Negative for nausea, vomiting and abdominal pain.  Genitourinary: Negative for dysuria, vaginal bleeding and vaginal discharge.  Musculoskeletal: Negative for back pain.  Skin: Negative for rash.  Neurological: Negative for weakness, numbness and  headaches.  Hematological: Does not bruise/bleed easily.  Psychiatric/Behavioral: Negative for confusion.    Allergies  Doxycycline  Home Medications   Current Outpatient Rx  Name  Route  Sig  Dispense  Refill  . CARVEDILOL 6.25 MG PO TABS   Oral   Take 6.25 mg by mouth 2 (two) times daily with a meal.         . FERROUS SULFATE 325 (65 FE) MG PO TABS   Oral   Take 325 mg by mouth 3 (three) times daily with meals.         . FUROSEMIDE 40 MG PO TABS   Oral   Take 40 mg by mouth 2 (two) times daily.         . IBUPROFEN 200 MG PO TABS   Oral   Take 800 mg by mouth every 6 (six) hours as needed. For pain         . LISINOPRIL 10 MG PO TABS   Oral   Take 10 mg by mouth daily.         Marland Kitchen METOPROLOL SUCCINATE ER 25 MG PO TB24   Oral   Take 50 mg by mouth daily.         Marland Kitchen POTASSIUM CHLORIDE CRYS ER 20 MEQ PO TBCR   Oral   Take 40 mEq by mouth daily.           BP 182/116  Pulse 109  Temp 97.6 F (36.4 C) (Oral)  Resp 20  SpO2 100%  Physical Exam  Nursing note and vitals reviewed. Constitutional: She is oriented to person, place, and time. She appears well-developed and well-nourished. No distress.  HENT:  Mouth/Throat: Oropharynx is clear and moist.  Eyes: Conjunctivae normal are normal. No scleral icterus.  Neck: Neck supple. No tracheal deviation present.  Cardiovascular: Normal rate.   Pulmonary/Chest: Effort normal. No respiratory distress.  Abdominal: Soft. Normal appearance and bowel sounds are normal. She exhibits no distension and no mass. There is no tenderness. There is no rebound and no guarding.  Genitourinary:       No cva tenderness  Musculoskeletal: She exhibits no edema.       tls spine non tender, aligned, no step off. Lumbar muscular tenderness.  Neurological: She is alert and oriented to person, place, and time.       Steady gait, motor intact bil.   Skin: Skin is warm and dry. No rash noted.       No skin changes, rash, shingles  in area of pain  Psychiatric: She has a normal mood and affect.    ED Course  Procedures (including critical care time)  Results for orders placed during the hospital encounter of 02/08/12  URINALYSIS, ROUTINE W REFLEX MICROSCOPIC      Component Value Range   Color, Urine YELLOW  YELLOW   APPearance CLEAR  CLEAR   Specific Gravity, Urine 1.007  1.005 - 1.030   pH 6.0  5.0 - 8.0   Glucose, UA NEGATIVE  NEGATIVE mg/dL   Hgb urine dipstick NEGATIVE  NEGATIVE   Bilirubin Urine NEGATIVE  NEGATIVE   Ketones, ur NEGATIVE  NEGATIVE mg/dL   Protein, ur NEGATIVE  NEGATIVE mg/dL   Urobilinogen, UA 0.2  0.0 - 1.0 mg/dL   Nitrite NEGATIVE  NEGATIVE   Leukocytes, UA NEGATIVE  NEGATIVE  PREGNANCY, URINE      Component Value Range   Preg Test, Ur NEGATIVE  NEGATIVE        MDM  Labs.  Pt states has ride, does not have to drive, no meds this am as of yet.  Hydrocodone po.  Reviewed nursing notes and prior charts for additional history.   ua normal/negative. Given neg ua and 1 week constant, lbp, bilat, worse w bending, not waxing/waning - felt now c/w ureteral colic.  No pyelo on labs, no fever, no cva tenderness.  Feel pts hx, exam, most c/w musculoskeletal back pain. Pt appears comfortable and stable for d/c.         Suzi Roots, MD 02/08/12 [phone]

## 2012-05-05 ENCOUNTER — Emergency Department (HOSPITAL_COMMUNITY)
Admission: EM | Admit: 2012-05-05 | Discharge: 2012-05-05 | Disposition: A | Discharge status: Home / Self Care | Payer: Medicaid Other | Attending: Emergency Medicine | Admitting: Emergency Medicine

## 2012-05-05 DIAGNOSIS — Z79899 Other long term (current) drug therapy: Secondary | ICD-10-CM | POA: Insufficient documentation

## 2012-05-05 DIAGNOSIS — M5430 Sciatica, unspecified side: Secondary | ICD-10-CM | POA: Insufficient documentation

## 2012-05-05 DIAGNOSIS — W010XXA Fall on same level from slipping, tripping and stumbling without subsequent striking against object, initial encounter: Secondary | ICD-10-CM | POA: Insufficient documentation

## 2012-05-05 DIAGNOSIS — M545 Low back pain: Secondary | ICD-10-CM

## 2012-05-05 DIAGNOSIS — Y9289 Other specified places as the place of occurrence of the external cause: Secondary | ICD-10-CM | POA: Insufficient documentation

## 2012-05-05 DIAGNOSIS — IMO0002 Reserved for concepts with insufficient information to code with codable children: Secondary | ICD-10-CM | POA: Insufficient documentation

## 2012-05-05 DIAGNOSIS — I10 Essential (primary) hypertension: Secondary | ICD-10-CM | POA: Insufficient documentation

## 2012-05-05 DIAGNOSIS — S8990XA Unspecified injury of unspecified lower leg, initial encounter: Secondary | ICD-10-CM | POA: Insufficient documentation

## 2012-05-05 DIAGNOSIS — Z87891 Personal history of nicotine dependence: Secondary | ICD-10-CM | POA: Insufficient documentation

## 2012-05-05 DIAGNOSIS — M5432 Sciatica, left side: Secondary | ICD-10-CM

## 2012-05-05 DIAGNOSIS — J45909 Unspecified asthma, uncomplicated: Secondary | ICD-10-CM | POA: Insufficient documentation

## 2012-05-05 DIAGNOSIS — Y9389 Activity, other specified: Secondary | ICD-10-CM | POA: Insufficient documentation

## 2012-05-05 DIAGNOSIS — R269 Unspecified abnormalities of gait and mobility: Secondary | ICD-10-CM | POA: Insufficient documentation

## 2012-05-05 MED ORDER — METHOCARBAMOL 750 MG PO TABS: 750.0000 mg | ORAL_TABLET | Freq: Four times a day (QID) | ORAL | Status: DC | PRN

## 2012-05-05 MED ORDER — HYDROCODONE-ACETAMINOPHEN 5-325 MG PO TABS
2.0000 | ORAL_TABLET | Freq: Once | ORAL | Status: AC
  Administered 2012-05-05: 2 via ORAL
  Filled 2012-05-05 (×2): qty 1

## 2012-05-05 MED ORDER — NAPROXEN 500 MG PO TABS: 500.0000 mg | ORAL_TABLET | Freq: Two times a day (BID) | ORAL | Status: DC | PRN

## 2012-05-05 MED ORDER — DIAZEPAM 5 MG PO TABS
10.0000 mg | ORAL_TABLET | Freq: Once | ORAL | Status: AC
  Administered 2012-05-05: 10 mg via ORAL
  Filled 2012-05-05 (×2): qty 1

## 2012-05-05 MED ORDER — HYDROCODONE-ACETAMINOPHEN 5-325 MG PO TABS: 1.0000 | ORAL_TABLET | Freq: Four times a day (QID) | ORAL | Status: DC | PRN

## 2012-05-05 NOTE — ED Provider Notes (Signed)
History     CSN: [id number]  Arrival date & time 05/05/12  1150   First MD Initiated Contact with Patient 05/05/12 1311      Chief Complaint  Patient presents with  . Back Pain  . Leg Pain  . Fall    (Consider location/radiation/quality/duration/timing/severity/associated sxs/prior treatment) The history is provided by the patient and medical records. No language interpreter was used.    Laurie Wade is a 32 y.o. female  with a hx of HTN, asthma presents to the Emergency Department complaining of gradual, persistent, progressively worsening low back pain with radiation into the L leg beginning Saturday morning after falling Friday evening by slipping on some water.  Pt denies hitting her head, LOC.  Pt also denies hx of back pain or back injury.  Associated symptoms include radiation of the pain into the L leg.  Laying on her stomach makes it better and bending, walking and sitting makes it worse.  Pt denies fever, chills, headache, neck pain, chest pain, shortness of breath, abdominal pain, nausea, vomiting, diarrhea, weakness, numbness, tingling, dizziness, syncope   Past Medical History  Diagnosis Date  . Hypertension   . Asthma     Past Surgical History  Procedure Laterality Date  . Cesarean section    . Cholecystectomy    . Tubal ligation      Family History  Problem Relation Age of Onset  . Heart failure Mother   . Hypertension Mother     History  Substance Use Topics  . Smoking status: Former Games developer  . Smokeless tobacco: Former Neurosurgeon    Quit date: 06/06/2006  . Alcohol Use: No    OB History   Grav Para Term Preterm Abortions TAB SAB Ect Mult Living                  Review of Systems  Constitutional: Negative for fever and fatigue.  HENT: Negative for neck pain and neck stiffness.   Respiratory: Negative for chest tightness and shortness of breath.   Cardiovascular: Negative for chest pain.  Gastrointestinal: Negative for nausea, vomiting, abdominal  pain and diarrhea.  Genitourinary: Negative for dysuria, urgency, frequency and hematuria.  Musculoskeletal: Positive for back pain and gait problem ( 2/2 pain). Negative for joint swelling.  Skin: Negative for rash.  Neurological: Negative for weakness, light-headedness, numbness and headaches.  All other systems reviewed and are negative.    Allergies  Doxycycline  Home Medications   Current Outpatient Rx  Name  Route  Sig  Dispense  Refill  . acetaminophen (TYLENOL) 500 MG tablet   Oral   Take 1,000 mg by mouth every 6 (six) hours as needed for pain.         . carvedilol (COREG) 6.25 MG tablet   Oral   Take 6.25 mg by mouth 2 (two) times daily with a meal.         . furosemide (LASIX) 40 MG tablet   Oral   Take 40 mg by mouth 2 (two) times daily.         Marland Kitchen ibuprofen (ADVIL,MOTRIN) 200 MG tablet   Oral   Take 800 mg by mouth every 6 (six) hours as needed. For pain         . lisinopril (PRINIVIL,ZESTRIL) 10 MG tablet   Oral   Take 10 mg by mouth daily.         Marland Kitchen HYDROcodone-acetaminophen (NORCO/VICODIN) 5-325 MG per tablet   Oral   Take 1 tablet  by mouth every 6 (six) hours as needed for pain (Take 1 - 2 tablets every 4 - 6 hours.).   20 tablet   0   . methocarbamol (ROBAXIN) 750 MG tablet   Oral   Take 1 tablet (750 mg total) by mouth 4 (four) times daily as needed (Take 1 tablet every 6 hours as needed for muscle spasms.).   20 tablet   0   . naproxen (NAPROSYN) 500 MG tablet   Oral   Take 1 tablet (500 mg total) by mouth 2 (two) times daily as needed.   30 tablet   0     BP 157/109  Pulse 95  Temp(Src) 98 F (36.7 C) (Oral)  Resp 18  SpO2 97%  Physical Exam  Nursing note and vitals reviewed. Constitutional: She is oriented to person, place, and time. She appears well-developed and well-nourished. No distress.  HENT:  Head: Normocephalic and atraumatic.  Mouth/Throat: Oropharynx is clear and moist. No oropharyngeal exudate.  Eyes:  Conjunctivae are normal. Pupils are equal, round, and reactive to light. No scleral icterus.  Neck: Normal range of motion. Neck supple.  Full ROM without pain  Cardiovascular: Normal rate, regular rhythm, normal heart sounds and intact distal pulses.   No murmur heard. Pulmonary/Chest: Effort normal and breath sounds normal. No respiratory distress. She has no wheezes. She has no rales. She exhibits no tenderness.  Abdominal: Soft. She exhibits no distension. There is no tenderness. There is no rebound.  Musculoskeletal:  Full range of motion of the T-spine and L-spine No tenderness to palpation of the spinous processes of the T-spine or L-spine Mild tenderness to palpation of the paraspinous muscles of the L-spine Reproducible radiation of pain to the L leg with palpation of the L paraspinal muscles  Lymphadenopathy:    She has no cervical adenopathy.  Neurological: She is alert and oriented to person, place, and time. She has normal reflexes. She exhibits normal muscle tone. Coordination normal.  Speech is clear and goal oriented, follows commands Normal strength in upper and lower extremities bilaterally including dorsiflexion and plantar flexion, strong and equal grip strength Sensation normal to light and sharp touch Moves extremities without ataxia, coordination intact Normal gait Normal balance  Skin: Skin is warm and dry. No rash noted. She is not diaphoretic. No erythema.  Psychiatric: She has a normal mood and affect. Her behavior is normal.    ED Course  Procedures (including critical care time)  Labs Reviewed - No data to display No results found.   1. Low back pain   2. Sciatica, left [724.3]       MDM  Laurie Wade presents with back pain and symptoms and PE consistent with sciatica.  No neurological deficits and normal neuro exam.  Patient can walk but states is painful.  No loss of bowel or bladder control.  No concern for cauda equina.  No fever, night sweats,  weight loss, h/o cancer, IVDU.  RICE protocol and pain medicine indicated and discussed with patient. I have also discussed reasons to return immediately to the ER.  Patient expresses understanding and agrees with plan.          Laurie Client Annalea Alguire, PA-C 05/05/12 1457

## 2012-05-05 NOTE — ED Notes (Signed)
Pt states that she fell and has been having pain to lt leg hip buttocks not feeling well, walked from triage

## 2012-05-05 NOTE — ED Notes (Signed)
Due to pt's bp tried to put pt on heart monitor. Pt refused to be put on heart monitor. Pt did not take bp medication this morning. Nurse aware of the above.

## 2012-05-06 NOTE — ED Provider Notes (Signed)
Medical screening examination/treatment/procedure(s) were performed by non-physician practitioner and as supervising physician I was immediately available for consultation/collaboration.  Donnetta Hutching, MD 05/06/12 2006

## 2012-08-01 ENCOUNTER — Emergency Department (HOSPITAL_COMMUNITY): Payer: Medicaid Other

## 2012-08-01 ENCOUNTER — Inpatient Hospital Stay (HOSPITAL_COMMUNITY)
Admission: EM | Admit: 2012-08-01 | Discharge: 2012-08-03 | DRG: 292 | Disposition: A | Discharge status: Home / Self Care | Payer: Medicaid Other | Attending: Cardiovascular Disease | Admitting: Cardiovascular Disease

## 2012-08-01 ENCOUNTER — Encounter (HOSPITAL_COMMUNITY): Payer: Self-pay | Admitting: Emergency Medicine

## 2012-08-01 DIAGNOSIS — I43 Cardiomyopathy in diseases classified elsewhere: Secondary | ICD-10-CM | POA: Diagnosis present

## 2012-08-01 DIAGNOSIS — I10 Essential (primary) hypertension: Secondary | ICD-10-CM

## 2012-08-01 DIAGNOSIS — Z8249 Family history of ischemic heart disease and other diseases of the circulatory system: Secondary | ICD-10-CM

## 2012-08-01 DIAGNOSIS — Z881 Allergy status to other antibiotic agents status: Secondary | ICD-10-CM

## 2012-08-01 DIAGNOSIS — I428 Other cardiomyopathies: Secondary | ICD-10-CM

## 2012-08-01 DIAGNOSIS — E669 Obesity, unspecified: Secondary | ICD-10-CM | POA: Diagnosis present

## 2012-08-01 DIAGNOSIS — Z91199 Patient's noncompliance with other medical treatment and regimen due to unspecified reason: Secondary | ICD-10-CM

## 2012-08-01 DIAGNOSIS — Z6833 Body mass index (BMI) 33.0-33.9, adult: Secondary | ICD-10-CM

## 2012-08-01 DIAGNOSIS — I509 Heart failure, unspecified: Secondary | ICD-10-CM

## 2012-08-01 DIAGNOSIS — G43909 Migraine, unspecified, not intractable, without status migrainosus: Secondary | ICD-10-CM | POA: Diagnosis present

## 2012-08-01 DIAGNOSIS — I110 Hypertensive heart disease with heart failure: Secondary | ICD-10-CM | POA: Diagnosis present

## 2012-08-01 DIAGNOSIS — Z9851 Tubal ligation status: Secondary | ICD-10-CM

## 2012-08-01 DIAGNOSIS — Z87891 Personal history of nicotine dependence: Secondary | ICD-10-CM

## 2012-08-01 DIAGNOSIS — I5023 Acute on chronic systolic (congestive) heart failure: Principal | ICD-10-CM

## 2012-08-01 DIAGNOSIS — E876 Hypokalemia: Secondary | ICD-10-CM | POA: Diagnosis present

## 2012-08-01 DIAGNOSIS — D509 Iron deficiency anemia, unspecified: Secondary | ICD-10-CM | POA: Diagnosis present

## 2012-08-01 DIAGNOSIS — Z9119 Patient's noncompliance with other medical treatment and regimen: Secondary | ICD-10-CM

## 2012-08-01 LAB — CBC WITH DIFFERENTIAL/PLATELET
Basophils Absolute: 0.1 10*3/uL (ref 0.0–0.1)
Eosinophils Relative: 3 % (ref 0–5)
HCT: 35.2 % — ABNORMAL LOW (ref 36.0–46.0)
Hemoglobin: 11 g/dL — ABNORMAL LOW (ref 12.0–15.0)
Lymphocytes Relative: 31 % (ref 12–46)
Lymphs Abs: 2.9 10*3/uL (ref 0.7–4.0)
MCV: 85.9 fL (ref 78.0–100.0)
Monocytes Absolute: 0.5 10*3/uL (ref 0.1–1.0)
Monocytes Relative: 5 % (ref 3–12)
Neutro Abs: 5.6 10*3/uL (ref 1.7–7.7)
RBC: 4.1 MIL/uL (ref 3.87–5.11)
RDW: 17.4 % — ABNORMAL HIGH (ref 11.5–15.5)
WBC: 9.4 10*3/uL (ref 4.0–10.5)

## 2012-08-01 LAB — URINALYSIS, ROUTINE W REFLEX MICROSCOPIC
Glucose, UA: NEGATIVE mg/dL
Hgb urine dipstick: NEGATIVE
Ketones, ur: NEGATIVE mg/dL
Leukocytes, UA: NEGATIVE
Protein, ur: NEGATIVE mg/dL
Urobilinogen, UA: 1 mg/dL (ref 0.0–1.0)

## 2012-08-01 LAB — COMPREHENSIVE METABOLIC PANEL
AST: 32 U/L (ref 0–37)
CO2: 23 mEq/L (ref 19–32)
Chloride: 100 mEq/L (ref 96–112)
Creatinine, Ser: 0.72 mg/dL (ref 0.50–1.10)
GFR calc Af Amer: >90 mL/min (ref >90)
GFR calc non Af Amer: >90 mL/min (ref >90)
Glucose, Bld: 131 mg/dL — ABNORMAL HIGH (ref 70–99)
Total Bilirubin: 0.4 mg/dL (ref 0.3–1.2)

## 2012-08-01 LAB — CREATININE, SERUM
Creatinine, Ser: 0.73 mg/dL (ref 0.50–1.10)
GFR calc non Af Amer: >90 mL/min (ref >90)

## 2012-08-01 LAB — CBC
Hemoglobin: 9.8 g/dL — ABNORMAL LOW (ref 12.0–15.0)
MCHC: 32.2 g/dL (ref 30.0–36.0)
Platelets: 247 10*3/uL (ref 150–400)

## 2012-08-01 MED ORDER — FUROSEMIDE 10 MG/ML IJ SOLN
40.0000 mg | Freq: Once | INTRAMUSCULAR | Status: AC
  Administered 2012-08-01: 40 mg via INTRAVENOUS
  Filled 2012-08-01: qty 4

## 2012-08-01 MED ORDER — ASPIRIN EC 81 MG PO TBEC
81.0000 mg | DELAYED_RELEASE_TABLET | Freq: Every day | ORAL | Status: DC
  Administered 2012-08-01 – 2012-08-02 (×2): 81 mg via ORAL
  Filled 2012-08-01 (×3): qty 1

## 2012-08-01 MED ORDER — POTASSIUM CHLORIDE CRYS ER 20 MEQ PO TBCR
40.0000 meq | EXTENDED_RELEASE_TABLET | Freq: Three times a day (TID) | ORAL | Status: DC
  Administered 2012-08-02 (×3): 40 meq via ORAL
  Filled 2012-08-01 (×9): qty 2

## 2012-08-01 MED ORDER — LEVOFLOXACIN IN D5W 500 MG/100ML IV SOLN
500.0000 mg | Freq: Once | INTRAVENOUS | Status: AC
  Administered 2012-08-01: 500 mg via INTRAVENOUS
  Filled 2012-08-01: qty 100

## 2012-08-01 MED ORDER — DEXTROSE 5 % IV SOLN
1.0000 g | INTRAVENOUS | Status: DC
  Administered 2012-08-01 – 2012-08-02 (×2): 1 g via INTRAVENOUS
  Filled 2012-08-01 (×3): qty 10

## 2012-08-01 MED ORDER — POTASSIUM CHLORIDE 10 MEQ/100ML IV SOLN
10.0000 meq | INTRAVENOUS | Status: DC
  Administered 2012-08-01: 10 meq via INTRAVENOUS
  Filled 2012-08-01: qty 300

## 2012-08-01 MED ORDER — ONDANSETRON HCL 4 MG/2ML IJ SOLN: 4.0000 mg | Freq: Four times a day (QID) | INTRAMUSCULAR | Status: DC | PRN

## 2012-08-01 MED ORDER — SODIUM CHLORIDE 0.9 % IV SOLN: 250.0000 mL | INTRAVENOUS | Status: DC | PRN

## 2012-08-01 MED ORDER — ACETAMINOPHEN 325 MG PO TABS: 650.0000 mg | ORAL_TABLET | ORAL | Status: DC | PRN

## 2012-08-01 MED ORDER — NITROGLYCERIN IN D5W 200-5 MCG/ML-% IV SOLN: 3.0000 ug/min | INTRAVENOUS | Status: DC

## 2012-08-01 MED ORDER — SODIUM CHLORIDE 0.9 % IJ SOLN
3.0000 mL | Freq: Two times a day (BID) | INTRAMUSCULAR | Status: DC
  Administered 2012-08-01 – 2012-08-03 (×4): 3 mL via INTRAVENOUS

## 2012-08-01 MED ORDER — NITROGLYCERIN IN D5W 200-5 MCG/ML-% IV SOLN
5.0000 ug/min | INTRAVENOUS | Status: DC
  Administered 2012-08-01: 5 ug/min via INTRAVENOUS
  Filled 2012-08-01: qty 250

## 2012-08-01 MED ORDER — OXYCODONE-ACETAMINOPHEN 5-325 MG PO TABS
1.0000 | ORAL_TABLET | ORAL | Status: DC | PRN
  Administered 2012-08-01 – 2012-08-03 (×8): 2 via ORAL
  Filled 2012-08-01 (×8): qty 2

## 2012-08-01 MED ORDER — CARVEDILOL 12.5 MG PO TABS
12.5000 mg | ORAL_TABLET | Freq: Two times a day (BID) | ORAL | Status: DC
  Administered 2012-08-01: 6.25 mg via ORAL
  Filled 2012-08-01 (×2): qty 1

## 2012-08-01 MED ORDER — POTASSIUM CHLORIDE CRYS ER 20 MEQ PO TBCR: 40.0000 meq | EXTENDED_RELEASE_TABLET | Freq: Three times a day (TID) | ORAL | Status: DC

## 2012-08-01 MED ORDER — FUROSEMIDE 10 MG/ML IJ SOLN
40.0000 mg | Freq: Three times a day (TID) | INTRAMUSCULAR | Status: DC
  Administered 2012-08-01 – 2012-08-02 (×6): 40 mg via INTRAVENOUS
  Filled 2012-08-01 (×9): qty 4

## 2012-08-01 MED ORDER — ENOXAPARIN SODIUM 40 MG/0.4ML ~~LOC~~ SOLN
40.0000 mg | SUBCUTANEOUS | Status: DC
  Administered 2012-08-01 – 2012-08-03 (×3): 40 mg via SUBCUTANEOUS
  Filled 2012-08-01 (×4): qty 0.4

## 2012-08-01 MED ORDER — POTASSIUM CHLORIDE CRYS ER 20 MEQ PO TBCR
40.0000 meq | EXTENDED_RELEASE_TABLET | ORAL | Status: AC
  Administered 2012-08-01 (×3): 40 meq via ORAL
  Filled 2012-08-01 (×2): qty 1
  Filled 2012-08-01: qty 2

## 2012-08-01 MED ORDER — CARVEDILOL 6.25 MG PO TABS
6.2500 mg | ORAL_TABLET | Freq: Two times a day (BID) | ORAL | Status: DC
  Administered 2012-08-01: 6.25 mg via ORAL
  Filled 2012-08-01 (×3): qty 1

## 2012-08-01 MED ORDER — SODIUM CHLORIDE 0.9 % IJ SOLN: 3.0000 mL | INTRAMUSCULAR | Status: DC | PRN

## 2012-08-01 MED ORDER — POTASSIUM CHLORIDE CRYS ER 20 MEQ PO TBCR: 40.0000 meq | EXTENDED_RELEASE_TABLET | Freq: Once | ORAL | Status: DC

## 2012-08-01 MED ORDER — ACETAMINOPHEN 325 MG PO TABS
650.0000 mg | ORAL_TABLET | ORAL | Status: DC | PRN
  Administered 2012-08-01: 650 mg via ORAL
  Filled 2012-08-01: qty 2

## 2012-08-01 MED ORDER — LORAZEPAM 2 MG/ML IJ SOLN
0.5000 mg | Freq: Once | INTRAMUSCULAR | Status: AC
  Administered 2012-08-01: 0.5 mg via INTRAVENOUS
  Filled 2012-08-01: qty 1

## 2012-08-01 MED ORDER — LISINOPRIL 10 MG PO TABS
10.0000 mg | ORAL_TABLET | Freq: Every day | ORAL | Status: DC
  Administered 2012-08-01 – 2012-08-03 (×3): 10 mg via ORAL
  Filled 2012-08-01 (×3): qty 1

## 2012-08-01 MED ORDER — DEXTROSE 5 % IV SOLN
1.0000 g | INTRAVENOUS | Status: DC
  Filled 2012-08-01: qty 10

## 2012-08-01 MED ORDER — CARVEDILOL 12.5 MG PO TABS
12.5000 mg | ORAL_TABLET | Freq: Two times a day (BID) | ORAL | Status: DC
  Administered 2012-08-01 – 2012-08-03 (×4): 12.5 mg via ORAL
  Filled 2012-08-01 (×6): qty 1

## 2012-08-01 NOTE — Progress Notes (Signed)
Notified Md about pt's c/o left foot pain.  Vs normal.   New orders received.  Will continue to monitor, and check labs in am.  Lonny Prude

## 2012-08-01 NOTE — ED Provider Notes (Signed)
CSN: [id number]     Arrival date & time 08/01/12  7829 History     First MD Initiated Contact with Patient 08/01/12 0255     Chief Complaint  Patient presents with  . Hemoptysis   (Consider location/radiation/quality/duration/timing/severity/associated sxs/prior Treatment) HPI 32 yo female presents to the ER from home with complaint of shortness of breath with bloody sputum for the last 5 days.  SOB mainly at night when trying to lay down to sleep.  Tonight was much worse.  She has noticed some swelling in her legs today.  No chest pain.  She reports h/o HTN, has run out of her lisinopril, but has been taking her lasix and coreg.  Not a smoker.  No fevers.  No TB contacts.  Pt is unsure of h/o CHF, but records show admission last year for chf, malignant hypertension.  Denies cocaine use.  Has missed last chf clinic visit.  Past Medical History  Diagnosis Date  . Hypertension    Past Surgical History  Procedure Laterality Date  . Cesarean section    . Cholecystectomy    . Tubal ligation     Family History  Problem Relation Age of Onset  . Heart failure Mother   . Hypertension Mother    History  Substance Use Topics  . Smoking status: Former Games developer  . Smokeless tobacco: Former Neurosurgeon    Quit date: 06/06/2006  . Alcohol Use: No   OB History   Grav Para Term Preterm Abortions TAB SAB Ect Mult Living                 Review of Systems  Unable to perform ROS: Acuity of condition    Allergies  Doxycycline  Home Medications   Current Outpatient Rx  Name  Route  Sig  Dispense  Refill  . acetaminophen (TYLENOL) 500 MG tablet   Oral   Take 1,000 mg by mouth every 6 (six) hours as needed for pain.         . carvedilol (COREG) 6.25 MG tablet   Oral   Take 6.25 mg by mouth 2 (two) times daily with a meal.         . furosemide (LASIX) 40 MG tablet   Oral   Take 40 mg by mouth 2 (two) times daily.         Marland Kitchen HYDROcodone-acetaminophen (NORCO/VICODIN) 5-325 MG per  tablet   Oral   Take 1 tablet by mouth every 6 (six) hours as needed for pain (Take 1 - 2 tablets every 4 - 6 hours.).   20 tablet   0   . ibuprofen (ADVIL,MOTRIN) 200 MG tablet   Oral   Take 800 mg by mouth every 6 (six) hours as needed. For pain         . lisinopril (PRINIVIL,ZESTRIL) 10 MG tablet   Oral   Take 10 mg by mouth daily.         . methocarbamol (ROBAXIN) 750 MG tablet   Oral   Take 1 tablet (750 mg total) by mouth 4 (four) times daily as needed (Take 1 tablet every 6 hours as needed for muscle spasms.).   20 tablet   0   . naproxen (NAPROSYN) 500 MG tablet   Oral   Take 1 tablet (500 mg total) by mouth 2 (two) times daily as needed.   30 tablet   0    BP 194/115  Pulse 115  Temp(Src) 97.1 F (36.2 C) (Oral)  Resp 27  Ht 5\' 6"  (1.676 m)  Wt 200 lb (90.719 kg)  BMI 32.3 kg/m2  SpO2 98%  LMP 07/08/2012 Physical Exam  Nursing note and vitals reviewed. Constitutional: She is oriented to person, place, and time. She appears well-developed and well-nourished. No distress.  HENT:  Head: Normocephalic and atraumatic.  Nose: Nose normal.  Mouth/Throat: Oropharynx is clear and moist.  Eyes: Conjunctivae and EOM are normal.  Neck: Normal range of motion. Neck supple. JVD present. No tracheal deviation present. No thyromegaly present.  Cardiovascular: Regular rhythm, normal heart sounds and intact distal pulses.  Exam reveals no gallop and no friction rub.   No murmur heard. tachycardia  Pulmonary/Chest: No stridor. She is in respiratory distress. She has rales.  Abdominal: Soft. Bowel sounds are normal. She exhibits no distension and no mass. There is no tenderness. There is no rebound and no guarding.  Musculoskeletal: She exhibits edema (2+edema). She exhibits no tenderness.  Lymphadenopathy:    She has no cervical adenopathy.  Neurological: She is alert and oriented to person, place, and time. She displays normal reflexes. She exhibits normal muscle  tone. Coordination normal.  Skin: Skin is warm and dry. No rash noted. No erythema. No pallor.  Psychiatric: She has a normal mood and affect. Her behavior is normal. Judgment and thought content normal.    ED Course   Procedures (including critical care time)  CRITICAL CARE Performed by: Olivia Mackie Total critical care time: 60 min Critical care time was exclusive of separately billable procedures and treating other patients. Critical care was necessary to treat or prevent imminent or life-threatening deterioration. Critical care was time spent personally by me on the following activities: development of treatment plan with patient and/or surrogate as well as nursing, discussions with consultants, evaluation of patient's response to treatment, examination of patient, obtaining history from patient or surrogate, ordering and performing treatments and interventions, ordering and review of laboratory studies, ordering and review of radiographic studies, pulse oximetry and re-evaluation of patient's condition.   Labs Reviewed  CBC WITH DIFFERENTIAL - Abnormal; Notable for the following:    Hemoglobin 11.0 (*)    HCT 35.2 (*)    RDW 17.4 (*)    All other components within normal limits  COMPREHENSIVE METABOLIC PANEL - Abnormal; Notable for the following:    Potassium 3.1 (*)    Glucose, Bld 131 (*)    All other components within normal limits  PRO B NATRIURETIC PEPTIDE - Abnormal; Notable for the following:    Pro B Natriuretic peptide (BNP) 1752.0 (*)    All other components within normal limits  URINALYSIS, ROUTINE W REFLEX MICROSCOPIC  POCT I-STAT TROPONIN I  POCT PREGNANCY, URINE    Date: 08/01/2012  Rate: 108  Rhythm: sinus tachycardia  QRS Axis: left  Intervals: normal  ST/T Wave abnormalities: nonspecific ST/T changes  Conduction Disutrbances:left bundle branch block  Narrative Interpretation: increase in discordant ST elevation from prior  Old EKG Reviewed: changes  noted    Dg Chest Port 1 View  08/01/2012   *RADIOLOGY REPORT*  Clinical Data: Shortness of breath  PORTABLE CHEST - 1 VIEW  Comparison: Prior CT from 06/05/2012  Findings: Cardiomegaly is present, not significantly changed as compared to the prior study.  The the patient is slightly rotated to the left.  The lungs are well inflated.  There are asymmetric diffuse fluffy airspace opacities seen throughout the mid and right lung, worrisome for possible pneumonia. There is mild perihilar vascular congestion  and peribronchial cuffing without overt evidence of pulmonary edema is identified.  There is no pleural effusion.  No pneumothorax. Osseous structures are within normal limits.  IMPRESSION: 1.  Diffuse parenchymal airspace opacities throughout the mid and lower right lung, worrisome for pneumonia.  2.  Cardiomegaly with mild pulmonary vascular congestion.  No frank pulmonary edema.   Original Report Authenticated By: Rise Mu, M.D.   1. 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  Exam shows rales throughout the lung fields, increased work of breathing and pink sputum.  Pt with signficantly elevated bp.  Will start with ntg drip, lasix, bipap.  Will need admission.  Will d/w cardiology.    4:09 AM CXR read as possible pna, no fevers, no elevated wbc.  Possible asymmetric edema.  Will treat with levaquin for possible cap, but clinical picture more c/w CHF/hypertensive crisis.    Olivia Mackie, MD 08/01/12 [phone]

## 2012-08-01 NOTE — Progress Notes (Signed)
This note also relates to the following rows which could not be included: Pulse Rate - Cannot attach notes to unvalidated device data Resp - Cannot attach notes to unvalidated device data BP - Cannot attach notes to unvalidated device data SpO2 - Cannot attach notes to unvalidated device data   Placed on stand by and 4L

## 2012-08-01 NOTE — ED Notes (Signed)
Pt c/o coughing up bright red blood, onset 5 days ago. Pt states becomes SHOB at night then has coughing spell which produces the bloody sputum. Pink tinged sputum noted in emesis bag.

## 2012-08-01 NOTE — Progress Notes (Signed)
Patient ID: Laurie Wade, female   DOB: 1980/12/20, 32 y.o.   MRN: [id number]   Patient ID: Laurie Wade MRN: [id number], DOB/AGE: 05-10-1980   Admit date: 08/01/2012   Primary Physician: Lonia Blood, MD Primary Cardiologist: Nicholes Mango MD Pt. Profile:  32 year old white female with known hypertensive nonischemic cardiomyopathy presents with acute pulmonary edema and malignant hypertension, out of her lisinopril.  Problem List  Past Medical History  Diagnosis Date  . Hypertension     Past Surgical History  Procedure Laterality Date  . Cesarean section    . Cholecystectomy    . Tubal ligation       Allergies  Allergies  Allergen Reactions  . Doxycycline Shortness Of Breath    HPI  She reports 5 days of increasing shortness of breath. She also has had some blood tinged sputum. The last couple days has had orthopnea and also increasing lower extremity edema. She's continued to take her Lasix and Coreg. She missed her last congestive heart failure clinic visit. [facility]. Blood pressure on arrival here was 220/115. She is on BiPAP and was given 40 mg of IV Lasix diuresed 1300. She's also on nitroglycerin drip. She is hypokalemic  Home Medications  Prior to Admission medications   Medication Sig Start Date End Date Taking? Authorizing Provider  carvedilol (COREG) 6.25 MG tablet Take 6.25 mg by mouth 2 (two) times daily with a meal.   Yes Historical Provider, MD  furosemide (LASIX) 40 MG tablet Take 40 mg by mouth 2 (two) times daily.   Yes Historical Provider, MD  ibuprofen (ADVIL,MOTRIN) 200 MG tablet Take 600 mg by mouth every 8 (eight) hours as needed for pain. For pain   Yes Historical Provider, MD  lisinopril (PRINIVIL,ZESTRIL) 10 MG tablet Take 10 mg by mouth every morning.    Yes Historical Provider, MD    Family History  Family History  Problem Relation Age of Onset  . Heart failure Mother   . Hypertension Mother     Social  History  History   Social History  . Marital Status: Single    Spouse Name: N/A    Number of Children: N/A  . Years of Education: N/A   Occupational History  . Not on file.   Social History Main Topics  . Smoking status: Former Games developer  . Smokeless tobacco: Former Neurosurgeon    Quit date: 06/06/2006  . Alcohol Use: No  . Drug Use: No  . Sexually Active: No   Other Topics Concern  . Not on file   Social History Narrative  . No narrative on file     Review of Systems General:  No chills, fever, night sweats or weight changes.  Cardiovascular:  No chest pain, Dermatological: No rash, lesions/masses Respiratory: No cough, dyspnea Urologic: No hematuria, dysuria Abdominal:   No nausea, vomiting, diarrhea, bright red blood per rectum, melena, or hematemesis Neurologic:  No visual changes, wkns, changes in mental status. All other systems reviewed and are otherwise negative except as noted above.  Physical Exam  Blood pressure 175/110, pulse 101, temperature 98.1 F (36.7 C), temperature source Oral, resp. rate 20, height 5\' 6"  (1.676 m), weight 200 lb (90.719 kg), last menstrual period 07/08/2012, SpO2 100.00%.  General: Pleasant, NAD, obese, BiPAP Psych: Normal affect. Neuro: Alert and oriented X 3. Moves all extremities spontaneously. HEENT: Normal  Neck: Supple without bruits, JVD to angle of the jaw. Lungs:  Resp regular and unlabored, CTA. Heart: RRR  no s3, s4, or murmurs. Abdomen: Soft, non-tender, non-distended, BS + x 4.  Extremities: No clubbing, cyanosis or edema. DP/PT/Radials 2+ and equal bilaterally.  Labs  No results found for this basename: CKTOTAL, CKMB, TROPONINI,  in the last 72 hours Lab Results  Component Value Date   WBC 9.4 08/01/2012   HGB 11.0* 08/01/2012   HCT 35.2* 08/01/2012   MCV 85.9 08/01/2012   PLT 339 08/01/2012    Recent Labs Lab 08/01/12 0250  NA 137  K 3.1*  CL 100  CO2 23  BUN 10  CREATININE 0.72  CALCIUM 9.3  PROT 7.0    BILITOT 0.4  ALKPHOS 74  ALT 29  AST 32  GLUCOSE 131*   Lab Results  Component Value Date   CHOL 144 06/06/2011   HDL 25* 06/06/2011   LDLCALC 99 06/06/2011   TRIG 101 06/06/2011   Lab Results  Component Value Date   DDIMER <0.27 10/29/2011     Radiology/Studies  Dg Chest Port 1 View  08/01/2012   *RADIOLOGY REPORT*  Clinical Data: Shortness of breath  PORTABLE CHEST - 1 VIEW  Comparison: Prior CT from 06/05/2012  Findings: Cardiomegaly is present, not significantly changed as compared to the prior study.  The the patient is slightly rotated to the left.  The lungs are well inflated.  There are asymmetric diffuse fluffy airspace opacities seen throughout the mid and right lung, worrisome for possible pneumonia. There is mild perihilar vascular congestion and peribronchial cuffing without overt evidence of pulmonary edema is identified.  There is no pleural effusion.  No pneumothorax. Osseous structures are within normal limits.  IMPRESSION: 1.  Diffuse parenchymal airspace opacities throughout the mid and lower right lung, worrisome for pneumonia.  2.  Cardiomegaly with mild pulmonary vascular congestion.  No frank pulmonary edema.   Original Report Authenticated By: Rise Mu, M.D.    ECG Sinus tachycardia, old left bundle branch block  ASSESSMENT AND PLAN  #1 acute on chronic systolic congestive heart failure. #2 nonischemic hypertensive cardiomyopathy #3 malignant hypertension #4 medical noncompliance #5 obesity #6 hypokalemia  Will titrate nitroglycerin for blood pressure control. Lasix 40 mg IV q. 8 aggressive potassium replacement. Restart lisinopril 10 mg per day. Continue carvedilol 6.25 mg now and then twice a day. Wean BiPAP as tolerated. Reinforced compliance. Prognosis guarded.   Signed, Valera Castle, MD 08/01/2012, @[facility]

## 2012-08-01 NOTE — ED Notes (Signed)
Report to [facility]  

## 2012-08-02 ENCOUNTER — Encounter (HOSPITAL_COMMUNITY): Payer: Self-pay

## 2012-08-02 LAB — BASIC METABOLIC PANEL
BUN: 9 mg/dL (ref 6–23)
Calcium: 8.8 mg/dL (ref 8.4–10.5)
Creatinine, Ser: 0.79 mg/dL (ref 0.50–1.10)
GFR calc Af Amer: >90 mL/min (ref >90)
GFR calc non Af Amer: >90 mL/min (ref >90)
Glucose, Bld: 116 mg/dL — ABNORMAL HIGH (ref 70–99)
Potassium: 3.4 mEq/L — ABNORMAL LOW (ref 3.5–5.1)

## 2012-08-02 MED ORDER — FERROUS SULFATE 325 (65 FE) MG PO TABS
325.0000 mg | ORAL_TABLET | Freq: Every day | ORAL | Status: DC
  Administered 2012-08-02 – 2012-08-03 (×2): 325 mg via ORAL
  Filled 2012-08-02 (×3): qty 1

## 2012-08-02 NOTE — Progress Notes (Addendum)
Patient ID: Girlie Veltri, female   DOB: 11-Nov-1980, 32 y.o.   MRN: [id number]   Patient Name: Laurie Wade Date of Encounter: 08/02/2012    SUBJECTIVE  Complains of headache. No shortness of breath or chest pain. Blood pressure controlled. IV nitroglycerin discontinued last night. Increased dose of carvedilol and  restarting lisinopril made a big difference.  CURRENT MEDS . aspirin EC  81 mg Oral Daily  . carvedilol  12.5 mg Oral BID WC  . cefTRIAXone (ROCEPHIN)  IV  1 g Intravenous Q24H  . enoxaparin (LOVENOX) injection  40 mg Subcutaneous Q24H  . furosemide  40 mg Intravenous TID  . lisinopril  10 mg Oral Daily  . potassium chloride  40 mEq Oral TID  . sodium chloride  3 mL Intravenous Q12H  . sodium chloride  3 mL Intravenous Q12H    OBJECTIVE  Filed Vitals:   08/02/12 0323 08/02/12 0326 08/02/12 0746 08/02/12 0803  BP:  93/70 116/61 116/61  Pulse: 81 93 85 85  Temp: 98.8 F (37.1 C)  99.7 F (37.6 C)   TempSrc: Oral  Oral   Resp: 18 26    Height:      Weight: 213 lb 10 oz (96.9 kg)     SpO2: 100% 100% 100%     Intake/Output Summary (Last 24 hours) at 08/02/12 0901 Last data filed at 08/02/12 0600  Gross per 24 hour  Intake    796 ml  Output   3425 ml  Net  -2629 ml   Filed Weights   08/01/12 0241 08/02/12 0323  Weight: 200 lb (90.719 kg) 213 lb 10 oz (96.9 kg)    PHYSICAL EXAM  General: Pleasant, NAD. Obese Neuro: Alert and oriented X 3. Moves all extremities spontaneously. Psych: Normal affect. HEENT:  Normal  Neck: Supple without bruits or JVD. Lungs:  Resp regular , crackles in the base Heart: RRR no s3, s4, or murmurs. Abdomen: Soft, non-tender, non-distended, BS + x 4.  Extremities: No clubbing, cyanosis , 1+ edema DP/PT/Radials 2+ and equal bilaterally.  Accessory Clinical Findings  CBC  Recent Labs  08/01/12 0250 08/01/12 0937  WBC 9.4 10.6*  NEUTROABS 5.6  --   HGB 11.0* 9.8*  HCT 35.2* 30.4*  MCV 85.9 85.6  PLT 339 247   Basic  Metabolic Panel  Recent Labs  08/01/12 0250 08/01/12 0937 08/02/12 0450  NA 137  --  138  K 3.1*  --  3.4*  CL 100  --  103  CO2 23  --  26  GLUCOSE 131*  --  116*  BUN 10  --  9  CREATININE 0.72 0.73 0.79  CALCIUM 9.3  --  8.8  MG  --   --  1.9   Liver Function Tests  Recent Labs  08/01/12 0250  AST 32  ALT 29  ALKPHOS 74  BILITOT 0.4  PROT 7.0  ALBUMIN 3.5   No results found for this basename: LIPASE, AMYLASE,  in the last 72 hours Cardiac Enzymes No results found for this basename: CKTOTAL, CKMB, CKMBINDEX, TROPONINI,  in the last 72 hours BNP No components found with this basename: POCBNP,  D-Dimer No results found for this basename: DDIMER,  in the last 72 hours Hemoglobin A1C No results found for this basename: HGBA1C,  in the last 72 hours Fasting Lipid Panel No results found for this basename: CHOL, HDL, LDLCALC, TRIG, CHOLHDL, LDLDIRECT,  in the last 72 hours Thyroid Function Tests No results found for  this basename: TSH, T4TOTAL, FREET3, T3FREE, THYROIDAB,  in the last 72 hours  TELE  Normal sinus rhythm  ECG    Radiology/Studies  Dg Chest Port 1 View  08/01/2012   *RADIOLOGY REPORT*  Clinical Data: Shortness of breath  PORTABLE CHEST - 1 VIEW  Comparison: Prior CT from 06/05/2012  Findings: Cardiomegaly is present, not significantly changed as compared to the prior study.  The the patient is slightly rotated to the left.  The lungs are well inflated.  There are asymmetric diffuse fluffy airspace opacities seen throughout the mid and right lung, worrisome for possible pneumonia. There is mild perihilar vascular congestion and peribronchial cuffing without overt evidence of pulmonary edema is identified.  There is no pleural effusion.  No pneumothorax. Osseous structures are within normal limits.  IMPRESSION: 1.  Diffuse parenchymal airspace opacities throughout the mid and lower right lung, worrisome for pneumonia.  2.  Cardiomegaly with mild pulmonary  vascular congestion.  No frank pulmonary edema.   Original Report Authenticated By: Rise Mu, M.D.    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 She is hypokalemic and will supplement. She has been on spironolactone in the past. Will leave to CHF team to decide to reinitiate this or not. We'll continue diuresis today with reassessed in the morning. I stressed and reinforced compliance including keeping her office visits with the heart failure clinic.  She continues to have iron deficient anemia. This was evaluated last summer. We'll start oral iron therapy.  Signed, Valera Castle MD

## 2012-08-03 DIAGNOSIS — I369 Nonrheumatic tricuspid valve disorder, unspecified: Secondary | ICD-10-CM

## 2012-08-03 LAB — BASIC METABOLIC PANEL
BUN: 13 mg/dL (ref 6–23)
Calcium: 9.4 mg/dL (ref 8.4–10.5)
Creatinine, Ser: 0.71 mg/dL (ref 0.50–1.10)
GFR calc Af Amer: >90 mL/min (ref >90)
GFR calc non Af Amer: >90 mL/min (ref >90)
Glucose, Bld: 107 mg/dL — ABNORMAL HIGH (ref 70–99)
Potassium: 4.2 mEq/L (ref 3.5–5.1)

## 2012-08-03 LAB — CBC
MCH: 27.2 pg (ref 26.0–34.0)
MCHC: 30.9 g/dL (ref 30.0–36.0)
Platelets: 310 10*3/uL (ref 150–400)
RDW: 18.5 % — ABNORMAL HIGH (ref 11.5–15.5)

## 2012-08-03 MED ORDER — CARVEDILOL 12.5 MG PO TABS: 12.5000 mg | ORAL_TABLET | Freq: Two times a day (BID) | ORAL | Status: DC

## 2012-08-03 MED ORDER — POTASSIUM CHLORIDE CRYS ER 20 MEQ PO TBCR: 40.0000 meq | EXTENDED_RELEASE_TABLET | Freq: Every day | ORAL | Status: DC

## 2012-08-03 MED ORDER — PERFLUTREN LIPID MICROSPHERE
INTRAVENOUS | Status: AC
  Filled 2012-08-03: qty 10

## 2012-08-03 MED ORDER — FUROSEMIDE 10 MG/ML IJ SOLN
40.0000 mg | Freq: Once | INTRAMUSCULAR | Status: AC
  Administered 2012-08-03: 40 mg via INTRAVENOUS

## 2012-08-03 MED ORDER — PERFLUTREN LIPID MICROSPHERE
INTRAVENOUS | Status: AC
  Administered 2012-08-03: 2 mL
  Filled 2012-08-03: qty 10

## 2012-08-03 MED ORDER — FERROUS SULFATE 325 (65 FE) MG PO TABS: 325.0000 mg | ORAL_TABLET | Freq: Every day | ORAL | Status: DC

## 2012-08-03 MED ORDER — PERFLUTREN LIPID MICROSPHERE
1.0000 mL | INTRAVENOUS | Status: AC | PRN
  Filled 2012-08-03: qty 10

## 2012-08-03 MED ORDER — POTASSIUM CHLORIDE CRYS ER 20 MEQ PO TBCR
40.0000 meq | EXTENDED_RELEASE_TABLET | Freq: Every day | ORAL | Status: DC
  Administered 2012-08-03: 40 meq via ORAL

## 2012-08-03 NOTE — Progress Notes (Signed)
Echocardiogram 2D Echocardiogram with Definity has been performed.  Laurie Wade 08/03/2012, 9:56 AM

## 2012-08-03 NOTE — Discharge Summary (Signed)
Advanced Heart Failure Team  Discharge Summary   Patient ID: Laurie Wade MRN: [id number], DOB/AGE: 1980/08/17 32 y.o. Admit date: 08/01/2012 D/C date:     08/03/2012   Primary Discharge Diagnoses:  1. Acute systolic heart failure, suspect HTN cardiomyopathy  -EF previously 15-20%. now 35-40% 2. Severe HTN with associated HF  3. Iron-def anemia, suspect due to heavy menses  4. Migraine HAs  5. Noncompliant  6. Hypokalemia     Hospital Course:  Laurie Wade 32 year old female with a history of chronic systolic heart failure EF 2013 15%,  HTN, anemia, S/P tubal ligation, and noncompliance.   She has been followed in the HF clinic was was last seen 07/03/2011 but failed to follow up for further appointments.   Laurie Wade admitted with increased dyspnea and malignant hypertension.  Blood pressure on arrival was 220/115. She had been out of her lisinopril. She was placed on nitroglycerin drip and intermittent IV lasix every 8 hours. As blood pressure improved,Nitroglycerin was stopped and she was transitioned to lisinopril 10 mg daily and carvedilol 12.5 mg twice a day. She diuresed with intermittent lasix and she was transitioned to lasix 40 mg twice a day. Overall she diuresed 6 liters. An ECHO was performed with an improved EF noted ~40%. Discharge weight 209 pounds. At time of discharge she was ambulating without dyspnea.   She was encouraged to pursue IUD as tubal ligation is not always 100% effective to prevent pregnancy. She says she will consider.   While hospitalized she educated on medication compliance, low salt food choices, and limiting fluid intake to < 2 liters per day. She will continue to be followed closely in the HF clinic with follow up 08/10/12 at 3:00.     Discharge Weight Range: Discharge weight 209 pounds.  Discharge Vitals: Blood pressure 117/80, pulse 80, temperature 98 F (36.7 C), temperature source Oral, resp. rate 19, height 5\' 6"  (1.676 m), weight 209 lb 10.5 oz  (95.1 kg), last menstrual period 07/08/2012, SpO2 95.00%.  Labs: Lab Results  Component Value Date   WBC 8.1 08/03/2012   HGB 10.4* 08/03/2012   HCT 33.7* 08/03/2012   MCV 88.2 08/03/2012   PLT 310 08/03/2012    Recent Labs Lab 08/01/12 0250  08/03/12 0417  NA 137  < > 139  K 3.1*  < > 4.2  CL 100  < > 104  CO2 23  < > 24  BUN 10  < > 13  CREATININE 0.72  < > 0.71  CALCIUM 9.3  < > 9.4  PROT 7.0  --   --   BILITOT 0.4  --   --   ALKPHOS 74  --   --   ALT 29  --   --   AST 32  --   --   GLUCOSE 131*  < > 107*  < > = values in this interval not displayed. Lab Results  Component Value Date   CHOL 144 06/06/2011   HDL 25* 06/06/2011   LDLCALC 99 06/06/2011   TRIG 101 06/06/2011   BNP (last 3 results)  Recent Labs  08/01/12 0250  PROBNP 1752.0*    Diagnostic Studies/Procedures   No results found.  Discharge Medications     Medication List    STOP taking these medications       ibuprofen 200 MG tablet  Commonly known as:  ADVIL,MOTRIN      TAKE these medications       carvedilol 12.5  MG tablet  Commonly known as:  COREG  Take 1 tablet (12.5 mg total) by mouth 2 (two) times daily with a meal.     ferrous sulfate 325 (65 FE) MG tablet  Take 1 tablet (325 mg total) by mouth daily with breakfast.     furosemide 40 MG tablet  Commonly known as:  LASIX  Take 40 mg by mouth 2 (two) times daily.     lisinopril 10 MG tablet  Commonly known as:  PRINIVIL,ZESTRIL  Take 10 mg by mouth every morning.     potassium chloride SA 20 MEQ tablet  Commonly known as:  K-DUR,KLOR-CON  Take 2 tablets (40 mEq total) by mouth daily.        Disposition   The patient will be discharged in stable condition to home.     Discharge Orders   Future Appointments Provider Department Dept Phone   08/10/2012 3:00 PM [facility] [phone]   Future Orders Complete By Expires     ACE Inhibitor / ARB already ordered  As  directed     Diet - low sodium heart healthy  As directed     Heart Failure patients record your daily weight using the same scale at the same time of day  As directed     Increase activity slowly  As directed       Follow-up Information   Follow up with Arvilla Meres, MD On 08/10/2012. (at 3:00 garage 0002)    Contact information:   [address] [place] [place] [address] [phone]         Duration of Discharge Encounter: Greater than 35 minutes   Signed, CLEGG,AMY  08/03/2012, 3:15 PM  Patient seen and examined with Tonye Becket, NP. We discussed all aspects of the encounter. I agree with the assessment and plan as stated above.   See daily rounding not for other details. i reviewed echo personally EF 40%. Stable for d/c. Stressed need for her to f/u with [place] in HF clinic.   Davionna Blacksher,MD 5:27 PM

## 2012-08-03 NOTE — Progress Notes (Signed)
Discharged home by wheelchair, stable. Discharge instructions given to pt. Belongings with pt.

## 2012-08-03 NOTE — Care Management Note (Signed)
    Page 1 of 1   08/03/2012     2:05:28 PM   CARE MANAGEMENT NOTE 08/03/2012  Patient:  [facility]   Account Number:  [id number]  Date Initiated:  08/03/2012  Documentation initiated by:  Junius Creamer  Subjective/Objective Assessment:   adm w htn     Action/Plan:   lives w fam, pcp dr Mikeal Hawthorne, has medicaid ins for meds   Anticipated DC Date:  08/03/2012   Anticipated DC Plan:  HOME/SELF CARE      DC Planning Services  CM consult      Choice offered to / List presented to:          [facility] arranged  HH - 11 Patient Refused      Status of service:   Medicare Important Message given?   (If response is "NO", the following Medicare IM given date fields will be blank) Date Medicare IM given:   Date Additional Medicare IM given:    Discharge Disposition:  HOME/SELF CARE  Per UR Regulation:  Reviewed for med. necessity/level of care/duration of stay  If discussed at Long Length of Stay Meetings, dates discussed:    Comments:  7/28 1404 debbie Yash Cacciola rn,bsn spoke w pt. she lives w her cousin. states able to get her meds ok w midicaid and low copays. she does not want any hhc states is indep at home.

## 2012-08-03 NOTE — Progress Notes (Signed)
Advanced Heart Failure Rounding Note   Subjective:    32 year old female with a history of HTN and noncompliance associated with HTN medications. New diagnosis systolic heart failure due to hypertension.   2013 ECHO EF 15%   06/11/11 LHC/RHC Normal Coronaries.  RA = 18  RV = 54/11 (19)  PA = 54/28 (37)  PCW = 26  Fick cardiac output/index = 7.5/3.9  Thermo CO/CI = 6.5/3.4  PVR = 1.5 Woods  FA sat = 97%  PA sat = 64%, 69%  SVC sat = 73%   D/C from [facility] 06/12/11 after being treated for acute systolic heart failure. D/C weight 177 pounds. D/C medications: Carvedilol 6.25 mg BID Lisinopril 10 mg daily Lasix 40 mg BID Spironolactone 12.5 mg daily. IUD recommended  She only followed up one time in the HF clinic which was 07/03/11. Has been noncompliant with meds and f/u since.  Admitted 08/01/12 with hemoptysis, dyspnea, and malignant hypertension. Admit weight 213 pounds.  Started on nitroglycerin and 40 mg IV lasix every 8 hours.   Feels better this am. Off NTG IV. BP normal to low. No dyspnea or orthopnea. Urine output slowing down. Down 5 pounds.    Objective:   Weight Range:  Vital Signs:   Temp:  [97.4 F (36.3 C)-100.2 F (37.9 C)] 98.8 F (37.1 C) (07/28 0408) Pulse Rate:  [80-100] 94 (07/28 0408) Resp:  [20-24] 20 (07/28 0408) BP: (101-136)/(48-89) 136/89 mmHg (07/28 0408) SpO2:  [93 %-100 %] 95 % (07/28 0408) Weight:  [209 lb 10.5 oz (95.1 kg)] 209 lb 10.5 oz (95.1 kg) (07/28 0408)    Weight change: Filed Weights   08/01/12 0700 08/02/12 0323 08/03/12 0408  Weight: 213 lb 10 oz (96.9 kg) 213 lb 10 oz (96.9 kg) 209 lb 10.5 oz (95.1 kg)    Intake/Output:   Intake/Output Summary (Last 24 hours) at 08/03/12 0734 Last data filed at 08/03/12 0400  Gross per 24 hour  Intake   1210 ml  Output   2800 ml  Net  -1590 ml     Physical Exam: General:  Fatigued appearing. No resp difficulty HEENT: normal Neck: supple. JVP hard to see. Does not appear elevated . Carotids  2+ bilat; no bruits. No lymphadenopathy or thryomegaly appreciated. Cor: PMI nondisplaced. Regular rate & rhythm. No rubs, gallops or murmurs. Lungs: clear Abdomen: soft, nontender, nondistended. No hepatosplenomegaly. No bruits or masses. Good bowel sounds. Extremities: no cyanosis, clubbing, rash, tr edema Neuro: alert & orientedx3, cranial nerves grossly intact. moves all 4 extremities w/o difficulty. Affect pleasant  Telemetry: SR 80s  Labs: Basic Metabolic Panel:  Recent Labs Lab 08/01/12 0250 08/01/12 0937 08/02/12 0450 08/03/12 0417  NA 137  --  138 139  K 3.1*  --  3.4* 4.2  CL 100  --  103 104  CO2 23  --  26 24  GLUCOSE 131*  --  116* 107*  BUN 10  --  9 13  CREATININE 0.72 0.73 0.79 0.71  CALCIUM 9.3  --  8.8 9.4  MG  --   --  1.9  --     Liver Function Tests:  Recent Labs Lab 08/01/12 0250  AST 32  ALT 29  ALKPHOS 74  BILITOT 0.4  PROT 7.0  ALBUMIN 3.5   No results found for this basename: LIPASE, AMYLASE,  in the last 168 hours No results found for this basename: AMMONIA,  in the last 168 hours  CBC:  Recent Labs  Lab 08/01/12 0250 08/01/12 0937  WBC 9.4 10.6*  NEUTROABS 5.6  --   HGB 11.0* 9.8*  HCT 35.2* 30.4*  MCV 85.9 85.6  PLT 339 247    Cardiac Enzymes: No results found for this basename: CKTOTAL, CKMB, CKMBINDEX, TROPONINI,  in the last 168 hours  BNP: BNP (last 3 results)  Recent Labs  08/01/12 0250  PROBNP 1752.0*     Other results:    Imaging:  No results found.   Medications:     Scheduled Medications: . aspirin EC  81 mg Oral Daily  . carvedilol  12.5 mg Oral BID WC  . cefTRIAXone (ROCEPHIN)  IV  1 g Intravenous Q24H  . enoxaparin (LOVENOX) injection  40 mg Subcutaneous Q24H  . ferrous sulfate  325 mg Oral Q breakfast  . furosemide  40 mg Intravenous TID  . lisinopril  10 mg Oral Daily  . potassium chloride  40 mEq Oral TID  . sodium chloride  3 mL Intravenous Q12H  . sodium chloride  3 mL Intravenous  Q12H     Infusions: . nitroGLYCERIN 50 mcg/min (08/01/12 1600)     PRN Medications:  sodium chloride, sodium chloride, acetaminophen, ondansetron (ZOFRAN) IV, oxyCODONE-acetaminophen, sodium chloride, sodium chloride   Assessment:   1. 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 Severe HTN with associated HF  3. Iron-def anemia, suspect due to heavy menses  4. Migraine HAs 5. Noncompliant   CLEGG,AMY 08/03/2012, 7:34 AM  Plan/Discussion:     Doing much better. Will give one more dose IV lasix and check echo today. Also check repeat CBC. Possible d/c this afternoon. Stressed need for close f/u and compliance with meds. Also discussed need for IUD to prevent pregnancy with severe LV dysfunction. Will continue carvedilol, lisinopril and lasix at d/c. No spiro due to lack of f/u.     Advanced Heart Failure Team Pager [phone] (M-F; 7a - 4p)  Please contact [facility] for night-coverage after hours (4p -7a ) and weekends on [email or web address]

## 2012-08-10 ENCOUNTER — Encounter (HOSPITAL_COMMUNITY): Payer: Medicaid Other

## 2012-08-11 ENCOUNTER — Encounter (HOSPITAL_COMMUNITY): Payer: Self-pay | Admitting: Anesthesiology

## 2012-08-12 ENCOUNTER — Encounter (HOSPITAL_COMMUNITY): Payer: Medicaid Other

## 2012-08-12 ENCOUNTER — Ambulatory Visit (HOSPITAL_COMMUNITY): Payer: Medicaid Other | Attending: Internal Medicine

## 2012-09-02 ENCOUNTER — Emergency Department (HOSPITAL_COMMUNITY)
Admission: EM | Admit: 2012-09-02 | Discharge: 2012-09-02 | Disposition: A | Discharge status: Home / Self Care | Payer: Medicaid Other | Attending: Emergency Medicine | Admitting: Emergency Medicine

## 2012-09-02 ENCOUNTER — Encounter (HOSPITAL_COMMUNITY): Payer: Self-pay | Admitting: *Deleted

## 2012-09-02 DIAGNOSIS — M545 Low back pain, unspecified: Secondary | ICD-10-CM | POA: Insufficient documentation

## 2012-09-02 DIAGNOSIS — I5022 Chronic systolic (congestive) heart failure: Secondary | ICD-10-CM | POA: Insufficient documentation

## 2012-09-02 DIAGNOSIS — Z87828 Personal history of other (healed) physical injury and trauma: Secondary | ICD-10-CM | POA: Insufficient documentation

## 2012-09-02 DIAGNOSIS — M5432 Sciatica, left side: Secondary | ICD-10-CM

## 2012-09-02 DIAGNOSIS — Z87891 Personal history of nicotine dependence: Secondary | ICD-10-CM | POA: Insufficient documentation

## 2012-09-02 DIAGNOSIS — M5430 Sciatica, unspecified side: Secondary | ICD-10-CM | POA: Insufficient documentation

## 2012-09-02 DIAGNOSIS — I10 Essential (primary) hypertension: Secondary | ICD-10-CM | POA: Insufficient documentation

## 2012-09-02 MED ORDER — HYDROCODONE-ACETAMINOPHEN 5-325 MG PO TABS: 1.0000 | ORAL_TABLET | Freq: Four times a day (QID) | ORAL | Status: DC | PRN

## 2012-09-02 MED ORDER — KETOROLAC TROMETHAMINE 60 MG/2ML IM SOLN
60.0000 mg | Freq: Once | INTRAMUSCULAR | Status: AC
  Administered 2012-09-02: 60 mg via INTRAMUSCULAR
  Filled 2012-09-02: qty 2

## 2012-09-02 MED ORDER — NAPROXEN 500 MG PO TABS: 500.0000 mg | ORAL_TABLET | Freq: Two times a day (BID) | ORAL | Status: DC | PRN

## 2012-09-02 MED ORDER — METHOCARBAMOL 750 MG PO TABS: 750.0000 mg | ORAL_TABLET | Freq: Four times a day (QID) | ORAL | Status: DC | PRN

## 2012-09-02 NOTE — ED Notes (Signed)
Pt in c/o lower back pain radiating down her left leg, history of same and was told in the past it was a pinched nerve, denies certain injury

## 2012-09-02 NOTE — ED Provider Notes (Signed)
CSN: [id number]     Arrival date & time 09/02/12  1621 History  This chart was scribed for Laurie Forth, PA working with Laurie Baton, MD by Laurie Wade, ED Scribe. This patient was seen in room TR11C/TR11C and the patient's care was started at 5:24 PM.    Chief Complaint  Patient presents with  . Back Pain    The history is provided by the patient. No language interpreter was used.    HPI Comments: Laurie Wade is a 32 y.o. female with h/o HTN and chronic systolic heart failure who presents to the Emergency Department complaining of 3 days of recurrence of gradual-onset, progressively-worsening lower back pain with radiation into the left leg.  Pain is constant and moderate and is exacerbated by walking.  She describes radiation as a sharp shooting pain.  She notes that she swept her kitchen floor prior to onset of pain but denies any falls or strenuous activities that may have brought on pain.  She denies numbness or tingling in feet, difficulty walking, bowel or bladder incontinence, or numbness to groin area.  Pt notes that she had similar pain 4 months ago after she fell by slipping on some water.  She was diagnosed with sciatica in the ED and states that her pain resolved with treatment prescribed at that visit.  She did not follow up with an orthopedist or her PCP.     Past Medical History  Diagnosis Date  . Hypertension   . Chronic systolic heart failure 05/2011    a. NICM b. 5/13 LHC: nl cors c. 7/14 ECHO: EF 35-40% diff HK, MV calcified annulus, LA mild dil, MV triv regur, TV triv regur    Past Surgical History  Procedure Laterality Date  . Cesarean section    . Cholecystectomy    . Tubal ligation      Family History  Problem Relation Age of Onset  . Heart failure Mother   . Hypertension Mother     History  Substance Use Topics  . Smoking status: Former Games developer  . Smokeless tobacco: Former Neurosurgeon    Quit date: 06/06/2006  . Alcohol Use: No    OB  History   Grav Para Term Preterm Abortions TAB SAB Ect Mult Living                   Review of Systems  Constitutional: Negative for fever, diaphoresis, appetite change, fatigue and unexpected weight change.  HENT: Negative for mouth sores and neck stiffness.   Eyes: Negative for visual disturbance.  Respiratory: Negative for cough, chest tightness, shortness of breath and wheezing.   Cardiovascular: Negative for chest pain.  Gastrointestinal: Negative for nausea, vomiting, abdominal pain, diarrhea and constipation.  Endocrine: Negative for polydipsia, polyphagia and polyuria.  Genitourinary: Negative for dysuria, urgency, frequency and hematuria.  Musculoskeletal: Positive for back pain.  Skin: Negative for rash.  Allergic/Immunologic: Negative for immunocompromised state.  Neurological: Negative for syncope, weakness, light-headedness, numbness and headaches.  Hematological: Does not bruise/bleed easily.  Psychiatric/Behavioral: Negative for sleep disturbance. The patient is not nervous/anxious.       Allergies  Doxycycline  Home Medications   Current Outpatient Rx  Name  Route  Sig  Dispense  Refill  . carvedilol (COREG) 12.5 MG tablet   Oral   Take 1 tablet (12.5 mg total) by mouth 2 (two) times daily with a meal.   60 tablet   3   . furosemide (LASIX) 40 MG tablet  Oral   Take 40 mg by mouth daily.          Marland Kitchen HYDROcodone-acetaminophen (NORCO/VICODIN) 5-325 MG per tablet   Oral   Take 1 tablet by mouth every 6 (six) hours as needed for pain (Take 1 - 2 tablets every 4 - 6 hours.).   20 tablet   0   . methocarbamol (ROBAXIN) 750 MG tablet   Oral   Take 1 tablet (750 mg total) by mouth 4 (four) times daily as needed (Take 1 tablet every 6 hours as needed for muscle spasms.).   20 tablet   0   . naproxen (NAPROSYN) 500 MG tablet   Oral   Take 1 tablet (500 mg total) by mouth 2 (two) times daily as needed.   30 tablet   0    BP 178/103  Pulse 103   Temp(Src) 98 F (36.7 C) (Oral)  Resp 20  Wt 209 lb (94.802 kg)  BMI 33.75 kg/m2  SpO2 100%  Physical Exam  Nursing note and vitals reviewed. Constitutional: She appears well-developed and well-nourished. No distress.  HENT:  Head: Normocephalic and atraumatic.  Mouth/Throat: Oropharynx is clear and moist. No oropharyngeal exudate.  Eyes: Conjunctivae are normal.  Neck: Normal range of motion. Neck supple.  Full ROM without pain  Cardiovascular: Normal rate, regular rhythm, normal heart sounds and intact distal pulses.   Pulmonary/Chest: Effort normal and breath sounds normal. No respiratory distress. She has no wheezes. She has no rales.  Abdominal: Soft. She exhibits no distension. There is no tenderness.  Musculoskeletal:       Lumbar back: She exhibits decreased range of motion and tenderness.  Full range of motion of the T-spine and L-spine No tenderness to palpation of the spinous processes of the T-spine or L-spine Tenderness to palpation of the left paraspinous muscles of the L-spine Reproducible sciatic pain in left thigh Decreased ROM in L-spine secondary to pain  Lymphadenopathy:    She has no cervical adenopathy.  Neurological: She is alert. She has normal reflexes.  Speech is clear and goal oriented, follows commands Normal strength in upper and lower extremities bilaterally including dorsiflexion and plantar flexion, strong and equal grip strength Sensation normal to light and sharp touch Moves extremities without ataxia, coordination intact Normal gait Normal balance   Skin: Skin is warm and dry. No rash noted. She is not diaphoretic. No erythema.    ED Course  Procedures (including critical care time)  DIAGNOSTIC STUDIES: Oxygen Saturation is 100% on room air, normal by my interpretation.    COORDINATION OF CARE: 5:27 PM-Discussed treatment plan which includes pain medication, anti-inflammatories and muscle relaxants with pt at bedside and pt agreed to  plan.   Labs Review  Labs Reviewed - No data to display  Imaging Review No results found.  MDM   1. Sciatica, left   2. HTN (hypertension)   3. Low back pain   4. Sciatica, left [724.3]      Laurie Wade presents with Hx of sciatica and similar HPI and PE today.  Patient denies known trauma, falls or other known injury.  No neurological deficits on exam.  Patient can walk but states is painful.  No loss of bowel or bladder control.  No concern for cauda equina.  No fever, night sweats, weight loss, h/o cancer, IVDU.  RICE protocol and pain medicine, anti-inflammatories and muscle relaxers indicated and discussed with patient.  Also recommended followup with primary care physician.  It has been  determined that no acute conditions requiring further emergency intervention are present at this time. The patient/guardian have been advised of the diagnosis and plan. We have discussed signs and symptoms that warrant return to the ED, such as changes or worsening in symptoms. Patient with history of hypertension and found to be hypertensive today in the department.  She reports she has not taken her hypertensive medications today and I believe that some of her hypertension is due to her pain.  Vital signs are stable at discharge.   BP 168/117  Pulse 99  Temp(Src) 98 F (36.7 C) (Oral)  Resp 18  Wt 209 lb (94.802 kg)  BMI 33.75 kg/m2  SpO2 99%  Patient/guardian has voiced understanding and agreed to follow-up with the PCP or specialist.  I personally performed the services described in this documentation, which was scribed in my presence. The recorded information has been reviewed and is accurate.   Dahlia Client Zaidan Keeble, PA-C 09/02/12 2348

## 2012-09-03 NOTE — ED Provider Notes (Signed)
Medical screening examination/treatment/procedure(s) were performed by non-physician practitioner and as supervising physician I was immediately available for consultation/collaboration.  Kendarious Gudino F Samarra Ridgely, MD 09/03/12 0827 

## 2012-09-29 ENCOUNTER — Emergency Department (HOSPITAL_COMMUNITY): Payer: Medicaid Other

## 2012-09-29 ENCOUNTER — Emergency Department (HOSPITAL_COMMUNITY)
Admission: EM | Admit: 2012-09-29 | Discharge: 2012-09-29 | Disposition: A | Discharge status: Home / Self Care | Payer: Medicaid Other | Attending: Emergency Medicine | Admitting: Emergency Medicine

## 2012-09-29 ENCOUNTER — Encounter (HOSPITAL_COMMUNITY): Payer: Self-pay | Admitting: Emergency Medicine

## 2012-09-29 DIAGNOSIS — M5430 Sciatica, unspecified side: Secondary | ICD-10-CM | POA: Insufficient documentation

## 2012-09-29 DIAGNOSIS — Z79899 Other long term (current) drug therapy: Secondary | ICD-10-CM | POA: Insufficient documentation

## 2012-09-29 DIAGNOSIS — I5022 Chronic systolic (congestive) heart failure: Secondary | ICD-10-CM | POA: Insufficient documentation

## 2012-09-29 DIAGNOSIS — R233 Spontaneous ecchymoses: Secondary | ICD-10-CM | POA: Insufficient documentation

## 2012-09-29 DIAGNOSIS — M5432 Sciatica, left side: Secondary | ICD-10-CM

## 2012-09-29 DIAGNOSIS — I10 Essential (primary) hypertension: Secondary | ICD-10-CM | POA: Insufficient documentation

## 2012-09-29 DIAGNOSIS — Z87891 Personal history of nicotine dependence: Secondary | ICD-10-CM | POA: Insufficient documentation

## 2012-09-29 DIAGNOSIS — E669 Obesity, unspecified: Secondary | ICD-10-CM | POA: Insufficient documentation

## 2012-09-29 MED ORDER — METHOCARBAMOL 500 MG PO TABS: 1000.0000 mg | ORAL_TABLET | Freq: Two times a day (BID) | ORAL | Status: DC | PRN

## 2012-09-29 MED ORDER — DEXAMETHASONE SODIUM PHOSPHATE 10 MG/ML IJ SOLN
10.0000 mg | Freq: Once | INTRAMUSCULAR | Status: AC
  Administered 2012-09-29: 10 mg via INTRAMUSCULAR
  Filled 2012-09-29: qty 1

## 2012-09-29 MED ORDER — OXYCODONE-ACETAMINOPHEN 5-325 MG PO TABS: 1.0000 | ORAL_TABLET | ORAL | Status: DC | PRN

## 2012-09-29 MED ORDER — IBUPROFEN 800 MG PO TABS
800.0000 mg | ORAL_TABLET | Freq: Once | ORAL | Status: AC
  Administered 2012-09-29: 800 mg via ORAL
  Filled 2012-09-29: qty 1

## 2012-09-29 NOTE — ED Notes (Signed)
PT states she has had lower back pain with radiation down L leg for past 2-3 days. Happened sporadically in past, but now more constant. Pt ambulatory.

## 2012-09-29 NOTE — ED Notes (Signed)
Laurie harris, pa notified that patient blood pressure is elevated and pt is scheduled to take her carvidelol bp medication tonight. No further orders at this time

## 2012-09-29 NOTE — ED Provider Notes (Signed)
CSN: [id number]     Arrival date & time 09/29/12  1546 History  This chart was scribed for non-physician practitioner Avel Sensor, MD by Danella Maiers, ED Scribe. This patient was seen in room WTR5/WTR5 and the patient's care was started at 4:55 PM.    Chief Complaint  Patient presents with  . Back Pain   The history is provided by the patient. No language interpreter was used.   HPI Comments: Laurie Wade is a 32 y.o. female who presents to the Emergency Department complaining of aching pain in her lower back that radiates to her left leg onset two days ago. She describes the pain in her left leg as sharp and shooting pain. She reports having similar pain in March or April and again last month but she did not have any tests done or see an orthopedist. She reports this recent episode is the same kind of pain but much worse. The left leg pain worsens when she straightens it. She takes ibuprofen nearly every day with no relief. She also reports rash bilaterally on her lower legs. She states she bruises easily. She denies urinary and bowel incontinence, diarrhea, constipation, headache, fevers. She denies recent URIs. She denies history of back problems.  Denies weakness, loss of bowel/bladder function or saddle anesthesia. Denies neck stiffness, headache, rash.  Denies fever or recent procedures to back.  Past Medical History  Diagnosis Date  . Hypertension   . Chronic systolic heart failure 05/2011    a. NICM b. 5/13 LHC: nl cors c. 7/14 ECHO: EF 35-40% diff HK, MV calcified annulus, LA mild dil, MV triv regur, TV triv regur   Past Surgical History  Procedure Laterality Date  . Cesarean section    . Cholecystectomy    . Tubal ligation     Family History  Problem Relation Age of Onset  . Heart failure Mother   . Hypertension Mother    History  Substance Use Topics  . Smoking status: Former Games developer  . Smokeless tobacco: Former Neurosurgeon    Quit date: 06/06/2006  . Alcohol  Use: No   OB History   Grav Para Term Preterm Abortions TAB SAB Ect Mult Living                 Review of Systems  Genitourinary: Negative for dysuria and frequency.  Musculoskeletal: Positive for back pain. Negative for gait problem.  Skin: Negative for rash.  Neurological: Negative for weakness.  A complete 10 system review of systems was obtained and all systems are negative except as noted in the HPI and PMH.   Allergies  Doxycycline  Home Medications   Current Outpatient Rx  Name  Route  Sig  Dispense  Refill  . carvedilol (COREG) 12.5 MG tablet   Oral   Take 12.5 mg by mouth every evening.         . furosemide (LASIX) 40 MG tablet   Oral   Take 40 mg by mouth daily.          Marland Kitchen ibuprofen (ADVIL,MOTRIN) 200 MG tablet   Oral   Take 600 mg by mouth every 6 (six) hours as needed for pain.          BP 155/108  Pulse 95  Temp(Src) 97.9 F (36.6 C)  Resp 18  Wt 205 lb (92.987 kg)  BMI 33.1 kg/m2  SpO2 99% Physical Exam  Nursing note and vitals reviewed. Constitutional: She is oriented to person, place, and  time. She appears well-developed and well-nourished. No distress.  Obese female. No acute distress.  HENT:  Head: Normocephalic and atraumatic.  Eyes: EOM are normal.  Neck: Neck supple. No tracheal deviation present.  Cardiovascular: Normal rate, regular rhythm, normal heart sounds and intact distal pulses.   Pulmonary/Chest: Effort normal and breath sounds normal. No respiratory distress.  Musculoskeletal: Normal range of motion.  Neurological: She is alert and oriented to person, place, and time.  Positive straight leg test on the left.   Skin: Skin is warm and dry.  Petechiae on her lower extremities bilaterally, no calf swelling, no calf tenderness.  Psychiatric: She has a normal mood and affect. Her behavior is normal.    ED Course  Procedures (including critical care time) Medications  dexamethasone (DECADRON) injection 10 mg (not  administered)  ibuprofen (ADVIL,MOTRIN) tablet 800 mg (800 mg Oral Given 09/29/12 1824)   DIAGNOSTIC STUDIES: Oxygen Saturation is 99% on room air, normal by my interpretation.    COORDINATION OF CARE: 6:29 PM- Discussed treatment plan with pt which includes steroid injection and spine xray and pt agrees to plan.    Labs Review Labs Reviewed - No data to display Imaging Review Dg Lumbar Spine Complete  09/29/2012   CLINICAL DATA:  Back pain  EXAM: LUMBAR SPINE - COMPLETE 4+ VIEW  COMPARISON:  None.  FINDINGS: There is no evidence of lumbar spine fracture. Alignment is normal. There is mild decrease intervertebral space at L5-S1.  IMPRESSION: No acute fracture or dislocation. Mild decreased intervertebral space at L5-S1.   Electronically Signed   By: Sherian Rein   On: 09/29/2012 19:06    MDM   1. Sciatica, left    Patient with sciatica and hypertension. She states that se did not take her blood pressure medication today.  States that it is higher than normal, likely due to her pain. Patient with back pain.  No neurological deficits and normal neuro exam.  Patient can walk but states is painful.  No loss of bowel or bladder control.  No concern for cauda equina.  No fever, night sweats, weight loss, h/o cancer, IVDU.  RICE protocol and pain medicine indicated and discussed with patient.  Patient should follow up with ortho.  I personally performed the services described in this documentation, which was scribed in my presence. The recorded information has been reviewed and is accurate.            Arthor Captain, PA-C 10/01/12 2114

## 2012-10-02 NOTE — ED Provider Notes (Signed)
Medical screening examination/treatment/procedure(s) were performed by non-physician practitioner and as supervising physician I was immediately available for consultation/collaboration.    Kylie Gros R Staysha Truby, MD 10/02/12 0339 

## 2012-10-31 ENCOUNTER — Telehealth: Payer: Self-pay | Admitting: Physician Assistant

## 2012-10-31 ENCOUNTER — Other Ambulatory Visit: Payer: Self-pay | Admitting: Physician Assistant

## 2012-10-31 MED ORDER — POTASSIUM CHLORIDE ER 20 MEQ PO TBCR: 20.0000 meq | EXTENDED_RELEASE_TABLET | Freq: Two times a day (BID) | ORAL | Status: DC

## 2012-10-31 MED ORDER — FUROSEMIDE 40 MG PO TABS: 40.0000 mg | ORAL_TABLET | Freq: Two times a day (BID) | ORAL | Status: DC

## 2012-10-31 NOTE — Telephone Encounter (Signed)
Patient called the answering service asking for refills on her Lasix and potassium. Reviewing her chart, she has not followed up with the CHF clinic in some time. Underwent IV diuresis and discharged 07/2012. Appointment 8/14 which she did not attend. She has been "working all the time." She states her weights have been stable, but she does note increased DOE and LEE. Stressed the importance of follow-up for HF monitoring. Advised dietary, salt restriction. Avoidance of EtOH. BP control. Will provide with 7 days' worth of Lasix and potassium only. She states she has the day off on 10/27 and will call for a follow-up appointment.    Jacqulyn Bath, PA-C 10/31/2012 3:04 PM

## 2012-10-31 NOTE — Telephone Encounter (Signed)
Error

## 2012-11-06 ENCOUNTER — Emergency Department (HOSPITAL_COMMUNITY)
Admission: EM | Admit: 2012-11-06 | Discharge: 2012-11-06 | Disposition: A | Discharge status: Home / Self Care | Payer: Medicaid Other | Attending: Emergency Medicine | Admitting: Emergency Medicine

## 2012-11-06 ENCOUNTER — Encounter (HOSPITAL_COMMUNITY): Payer: Self-pay | Admitting: Emergency Medicine

## 2012-11-06 DIAGNOSIS — Z79899 Other long term (current) drug therapy: Secondary | ICD-10-CM | POA: Insufficient documentation

## 2012-11-06 DIAGNOSIS — I5022 Chronic systolic (congestive) heart failure: Secondary | ICD-10-CM | POA: Insufficient documentation

## 2012-11-06 DIAGNOSIS — I10 Essential (primary) hypertension: Secondary | ICD-10-CM | POA: Insufficient documentation

## 2012-11-06 DIAGNOSIS — M5416 Radiculopathy, lumbar region: Secondary | ICD-10-CM

## 2012-11-06 DIAGNOSIS — Z87891 Personal history of nicotine dependence: Secondary | ICD-10-CM | POA: Insufficient documentation

## 2012-11-06 DIAGNOSIS — M6281 Muscle weakness (generalized): Secondary | ICD-10-CM | POA: Insufficient documentation

## 2012-11-06 DIAGNOSIS — IMO0002 Reserved for concepts with insufficient information to code with codable children: Secondary | ICD-10-CM | POA: Insufficient documentation

## 2012-11-06 MED ORDER — IBUPROFEN 800 MG PO TABS: 800.0000 mg | ORAL_TABLET | Freq: Three times a day (TID) | ORAL | Status: DC

## 2012-11-06 MED ORDER — OXYCODONE-ACETAMINOPHEN 5-325 MG PO TABS: 1.0000 | ORAL_TABLET | ORAL | Status: DC | PRN

## 2012-11-06 MED ORDER — CYCLOBENZAPRINE HCL 10 MG PO TABS: 10.0000 mg | ORAL_TABLET | Freq: Two times a day (BID) | ORAL | Status: DC | PRN

## 2012-11-06 NOTE — ED Provider Notes (Signed)
CSN: [id number]     Arrival date & time 11/06/12  1533 History  This chart was scribed for non-physician practitioner Jaynie Crumble working with Shelda Jakes, MD by Carl Best, ED Scribe. This patient was seen in room TR09C/TR09C and the patient's care was started at 4:09 PM.      Chief Complaint  Patient presents with  . Back Pain    Patient is a 32 y.o. female presenting with back pain. The history is provided by the patient. No language interpreter was used.  Back Pain Associated symptoms: weakness (left leg)   Associated symptoms: no abdominal pain, no fever and no numbness    HPI Comments: Laurie Wade is a 32 y.o. female who presents to the Emergency Department complaining of worsening right back pain that radiates to her left leg, left buttock, and left foot that started two days ago.  She confirms left leg weakness as an associated symptom.  The patient denies any numbness in her legs, abdominal pain, and fever as associated symptoms.  She denies a history of injury to her back.  She states that she took ibuprofen for her pain yesterday with no relief to her symptoms.  She denies having a history of MRI.  She confirms having a PCP.     Past Medical History  Diagnosis Date  . Hypertension   . Chronic systolic heart failure 05/2011    a. NICM b. 5/13 LHC: nl cors c. 7/14 ECHO: EF 35-40% diff HK, MV calcified annulus, LA mild dil, MV triv regur, TV triv regur   Past Surgical History  Procedure Laterality Date  . Cesarean section    . Cholecystectomy    . Tubal ligation     Family History  Problem Relation Age of Onset  . Heart failure Mother   . Hypertension Mother    History  Substance Use Topics  . Smoking status: Former Games developer  . Smokeless tobacco: Former Neurosurgeon    Quit date: 06/06/2006  . Alcohol Use: No   OB History   Grav Para Term Preterm Abortions TAB SAB Ect Mult Living                 Review of Systems  Constitutional: Negative for fever.   Gastrointestinal: Negative for abdominal pain.  Musculoskeletal: Positive for arthralgias (right buttock, left leg and left foot) and back pain.  Neurological: Positive for weakness (left leg). Negative for numbness.  All other systems reviewed and are negative.    Allergies  Doxycycline  Home Medications   Current Outpatient Rx  Name  Route  Sig  Dispense  Refill  . Aspirin-Salicylamide-Caffeine (ARTHRITIS STRENGTH BC POWDER PO)   Oral   Take 1 packet by mouth daily as needed (pain).         . carvedilol (COREG) 12.5 MG tablet   Oral   Take 12.5 mg by mouth 2 (two) times daily with a meal.          . furosemide (LASIX) 40 MG tablet   Oral   Take 1 tablet (40 mg total) by mouth 2 (two) times daily.   30 tablet      . ibuprofen (ADVIL,MOTRIN) 200 MG tablet   Oral   Take 600 mg by mouth every 6 (six) hours as needed for pain.         . potassium chloride 20 MEQ TBCR   Oral   Take 20 mEq by mouth 2 (two) times daily.  Triage Vitals: BP 157/103  Pulse 83  Temp(Src) 97.7 F (36.5 C) (Oral)  Ht 5\' 6"  (1.676 m)  Wt 200 lb (90.719 kg)  BMI 32.3 kg/m2  SpO2 99%  LMP 10/30/2012  Physical Exam  Nursing note and vitals reviewed. Constitutional: She is oriented to person, place, and time. She appears well-developed and well-nourished. No distress.  HENT:  Head: Normocephalic and atraumatic.  Mouth/Throat: Oropharynx is clear and moist. No oropharyngeal exudate.  Eyes: EOM are normal. Pupils are equal, round, and reactive to light.  Neck: Normal range of motion. Neck supple. No tracheal deviation present.  Cardiovascular: Normal rate, regular rhythm and normal heart sounds.   Dorsal pedal pulses intact bilaterally  Pulmonary/Chest: Effort normal and breath sounds normal. No respiratory distress.  Abdominal: Soft. There is no tenderness.  Musculoskeletal: Normal range of motion.  Mild midline lumbar tenderness, no paravertebral tenderness, left SI joint  tenderness, tenderness in the left buttock, pain with straight left leg raise, 5/5 and equal lower extremity strength, able to dorsiflex her left foot.   Neurological: She is alert and oriented to person, place, and time.  Normal strength and sensation over bilateral lower extremities. No saddle anesthesia. Patellar reflexes 2+ bilaterally.   Skin: Skin is warm and dry.  Psychiatric: She has a normal mood and affect. Her behavior is normal.    ED Course  Procedures (including critical care time)  DIAGNOSTIC STUDIES: Oxygen Saturation is 99% on room air, normal by my interpretation.    COORDINATION OF CARE: 4:12 PM- Discussed a clinical suspicion of sciatica and a herniated disc with the patient.  Advised the patient to obtain an MRI and follow up with her PCP.  Discussed discharging the patient with a prescription for pain medication.  The patient agreed to the treatment plan.    Labs Review Labs Reviewed - No data to display Imaging Review No results found.  EKG Interpretation   None       MDM   1. Lumbar radiculopathy     Patient with acute on chronic back pain with left lower extremity radiculopathy. Pt neurovascularly intact. Normal reflexes. Normal sensation. No loss of bowels or urinary incontinence/retention. No recent falls or injuries. Pt ambulatory. Afebrile. No signs of cauda equina. No imaging indicated this time  She does have a primary care Dr. Davina Poke to followup for further evaluation. Percocet for pain, Flexeril for muscle spasms, and ibuprofen. Instructed to try heating pads.  Patient is mildly hypertensive, history of the same. Could be do to pain however instructed to followup to get that rechecked   Filed Vitals:   11/06/12 1540  BP: 157/103  Pulse: 83  Temp: 97.7 F (36.5 C)  TempSrc: Oral  Height: 5\' 6"  (1.676 m)  Weight: 200 lb (90.719 kg)  SpO2: 99%    I personally performed the services described in this documentation, which was scribed  in my presence. The recorded information has been reviewed and is accurate.    Lottie Mussel, PA-C 11/06/12 1705

## 2012-11-06 NOTE — ED Notes (Signed)
Pt started this am with right buttock pain down right leg to foot. Has had this in the past. Ambulatory to ED

## 2012-11-08 NOTE — ED Provider Notes (Signed)
Medical screening examination/treatment/procedure(s) were performed by non-physician practitioner and as supervising physician I was immediately available for consultation/collaboration.  EKG Interpretation   None         Jamarie Joplin W. Blakleigh Straw, MD 11/08/12 1720 

## 2012-11-12 ENCOUNTER — Encounter (HOSPITAL_COMMUNITY): Payer: Medicaid Other

## 2012-12-22 ENCOUNTER — Emergency Department (HOSPITAL_COMMUNITY)
Admission: EM | Admit: 2012-12-22 | Discharge: 2012-12-23 | Disposition: A | Discharge status: Home / Self Care | Payer: Medicaid Other | Attending: Emergency Medicine | Admitting: Emergency Medicine

## 2012-12-22 ENCOUNTER — Emergency Department (HOSPITAL_COMMUNITY): Payer: Medicaid Other

## 2012-12-22 ENCOUNTER — Encounter (HOSPITAL_COMMUNITY): Payer: Self-pay | Admitting: Emergency Medicine

## 2012-12-22 DIAGNOSIS — Z91199 Patient's noncompliance with other medical treatment and regimen due to unspecified reason: Secondary | ICD-10-CM | POA: Insufficient documentation

## 2012-12-22 DIAGNOSIS — Z9119 Patient's noncompliance with other medical treatment and regimen: Secondary | ICD-10-CM | POA: Insufficient documentation

## 2012-12-22 DIAGNOSIS — I509 Heart failure, unspecified: Secondary | ICD-10-CM

## 2012-12-22 DIAGNOSIS — Z79899 Other long term (current) drug therapy: Secondary | ICD-10-CM | POA: Insufficient documentation

## 2012-12-22 DIAGNOSIS — Z881 Allergy status to other antibiotic agents status: Secondary | ICD-10-CM | POA: Insufficient documentation

## 2012-12-22 DIAGNOSIS — Z87891 Personal history of nicotine dependence: Secondary | ICD-10-CM | POA: Insufficient documentation

## 2012-12-22 DIAGNOSIS — I10 Essential (primary) hypertension: Secondary | ICD-10-CM | POA: Insufficient documentation

## 2012-12-22 DIAGNOSIS — R0789 Other chest pain: Secondary | ICD-10-CM | POA: Insufficient documentation

## 2012-12-22 DIAGNOSIS — I5022 Chronic systolic (congestive) heart failure: Secondary | ICD-10-CM | POA: Insufficient documentation

## 2012-12-22 LAB — BASIC METABOLIC PANEL
BUN: 15 mg/dL (ref 6–23)
Chloride: 106 mEq/L (ref 96–112)
GFR calc Af Amer: >90 mL/min (ref >90)
GFR calc non Af Amer: >90 mL/min (ref >90)
Potassium: 3.7 mEq/L (ref 3.5–5.1)

## 2012-12-22 LAB — CBC
HCT: 37.5 % (ref 36.0–46.0)
MCHC: 32.3 g/dL (ref 30.0–36.0)
Platelets: 248 10*3/uL (ref 150–400)
RDW: 16.5 % — ABNORMAL HIGH (ref 11.5–15.5)
WBC: 8.3 10*3/uL (ref 4.0–10.5)

## 2012-12-22 LAB — PRO B NATRIURETIC PEPTIDE: Pro B Natriuretic peptide (BNP): 3723 pg/mL — ABNORMAL HIGH (ref 0–125)

## 2012-12-22 LAB — POCT I-STAT TROPONIN I

## 2012-12-22 MED ORDER — CARVEDILOL 12.5 MG PO TABS: 12.5000 mg | ORAL_TABLET | Freq: Two times a day (BID) | ORAL | Status: DC

## 2012-12-22 MED ORDER — OXYCODONE-ACETAMINOPHEN 5-325 MG PO TABS
1.0000 | ORAL_TABLET | Freq: Once | ORAL | Status: AC
  Administered 2012-12-22: 1 via ORAL
  Filled 2012-12-22: qty 1

## 2012-12-22 MED ORDER — ONDANSETRON HCL 4 MG/2ML IJ SOLN
4.0000 mg | Freq: Once | INTRAMUSCULAR | Status: AC
  Administered 2012-12-22: 4 mg via INTRAVENOUS
  Filled 2012-12-22: qty 2

## 2012-12-22 MED ORDER — FUROSEMIDE 20 MG PO TABS
20.0000 mg | ORAL_TABLET | Freq: Every day | ORAL | Status: DC
  Filled 2012-12-22: qty 1

## 2012-12-22 MED ORDER — MORPHINE SULFATE 4 MG/ML IJ SOLN
4.0000 mg | INTRAMUSCULAR | Status: DC | PRN
  Administered 2012-12-22: 4 mg via INTRAVENOUS
  Filled 2012-12-22: qty 1

## 2012-12-22 MED ORDER — FUROSEMIDE 20 MG PO TABS
20.0000 mg | ORAL_TABLET | Freq: Once | ORAL | Status: AC
  Administered 2012-12-22: 20 mg via ORAL
  Filled 2012-12-22: qty 1

## 2012-12-22 MED ORDER — CARVEDILOL 12.5 MG PO TABS
12.5000 mg | ORAL_TABLET | Freq: Once | ORAL | Status: AC
  Administered 2012-12-22: 12.5 mg via ORAL
  Filled 2012-12-22 (×2): qty 1

## 2012-12-22 MED ORDER — ENALAPRILAT 1.25 MG/ML IV SOLN
1.2500 mg | Freq: Once | INTRAVENOUS | Status: AC
  Administered 2012-12-22: 1.25 mg via INTRAVENOUS
  Filled 2012-12-22: qty 1

## 2012-12-22 NOTE — ED Notes (Signed)
Pt c/o mid sternal CP worse with inspiration and cough starting today with SOB; pt sts out of htn meds x 1 week

## 2012-12-23 MED ORDER — CARVEDILOL 12.5 MG PO TABS: 12.5000 mg | ORAL_TABLET | Freq: Two times a day (BID) | ORAL | Status: DC

## 2012-12-23 MED ORDER — FUROSEMIDE 40 MG PO TABS: 40.0000 mg | ORAL_TABLET | Freq: Every day | ORAL | Status: DC

## 2012-12-23 NOTE — ED Provider Notes (Signed)
CSN: [id number]     Arrival date & time 12/22/12  1843 History   First MD Initiated Contact with Patient 12/22/12 2204     Chief Complaint  Patient presents with  . Shortness of Breath  . Chest Pain    HPI  Vision presents with some nonspecific discomfort in her anterior chest patient is a cough and feels short of breath. She states last time she felt like this was last summer. She states her blood pressure was high and she is out of her medications. Today her blood pressure is high and she is again out of her medications for the last week. Has a history of hypertensive cardiomyopathy and congestive heart failure. She has noncompliant with her Coreg and Lasix for the last week.  Past Medical History  Diagnosis Date  . Hypertension   . Chronic systolic heart failure 05/2011    a. NICM b. 5/13 LHC: nl cors c. 7/14 ECHO: EF 35-40% diff HK, MV calcified annulus, LA mild dil, MV triv regur, TV triv regur   Past Surgical History  Procedure Laterality Date  . Cesarean section    . Cholecystectomy    . Tubal ligation     Family History  Problem Relation Age of Onset  . Heart failure Mother   . Hypertension Mother    History  Substance Use Topics  . Smoking status: Former Games developer  . Smokeless tobacco: Former Neurosurgeon    Quit date: 06/06/2006  . Alcohol Use: No   OB History   Grav Para Term Preterm Abortions TAB SAB Ect Mult Living                 Review of Systems  Constitutional: Negative for fever, chills, diaphoresis, appetite change and fatigue.  HENT: Negative for mouth sores, sore throat and trouble swallowing.   Eyes: Negative for visual disturbance.  Respiratory: Positive for cough and shortness of breath. Negative for chest tightness and wheezing.   Cardiovascular: Positive for chest pain. Negative for leg swelling.  Gastrointestinal: Negative for nausea, vomiting, abdominal pain, diarrhea and abdominal distention.  Endocrine: Negative for polydipsia, polyphagia and  polyuria.  Genitourinary: Negative for dysuria, frequency and hematuria.  Musculoskeletal: Negative for gait problem.  Skin: Negative for color change, pallor and rash.  Neurological: Negative for dizziness, syncope, light-headedness and headaches.  Hematological: Does not bruise/bleed easily.  Psychiatric/Behavioral: Negative for behavioral problems and confusion.    Allergies  Doxycycline  Home Medications   Current Outpatient Rx  Name  Route  Sig  Dispense  Refill  . Aspirin-Salicylamide-Caffeine (ARTHRITIS STRENGTH BC POWDER PO)   Oral   Take 1 packet by mouth daily as needed (pain).         . carvedilol (COREG) 12.5 MG tablet   Oral   Take 12.5 mg by mouth 2 (two) times daily with a meal.          . furosemide (LASIX) 40 MG tablet   Oral   Take 1 tablet (40 mg total) by mouth 2 (two) times daily.   30 tablet      . ibuprofen (ADVIL,MOTRIN) 200 MG tablet   Oral   Take 600 mg by mouth every 6 (six) hours as needed for pain.         . carvedilol (COREG) 12.5 MG tablet   Oral   Take 1 tablet (12.5 mg total) by mouth 2 (two) times daily with a meal.   60 tablet   0   . furosemide (  LASIX) 40 MG tablet   Oral   Take 1 tablet (40 mg total) by mouth daily.   30 tablet   0    BP 149/104  Pulse 94  Temp(Src) 98.1 F (36.7 C) (Oral)  Resp 22  SpO2 96% Physical Exam  Constitutional: She is oriented to person, place, and time. She appears well-developed and well-nourished. No distress.  HENT:  Head: Normocephalic.  Eyes: Conjunctivae are normal. Pupils are equal, round, and reactive to light. No scleral icterus.  Neck: Normal range of motion. Neck supple. No thyromegaly present.  Cardiovascular: Normal rate, regular rhythm and normal heart sounds.  Exam reveals no gallop, no S3, no S4 and no friction rub.   No murmur heard. He does not sound as though she is in congestive heart failure. Did not hear crackles or rales. I do not hear a gallop rhythm   Pulmonary/Chest: Effort normal and breath sounds normal. No respiratory distress. She has no wheezes. She has no rales.  Abdominal: Soft. Bowel sounds are normal. She exhibits no distension. There is no tenderness. There is no rebound.  Musculoskeletal: Normal range of motion.  Neurological: She is alert and oriented to person, place, and time.  Skin: Skin is warm and dry. No rash noted.  No lower extremity edema  Psychiatric: She has a normal mood and affect. Her behavior is normal.    ED Course  Procedures (including critical care time) Labs Review Labs Reviewed  CBC - Abnormal; Notable for the following:    RDW 16.5 (*)    All other components within normal limits  BASIC METABOLIC PANEL - Abnormal; Notable for the following:    Glucose, Bld 102 (*)    All other components within normal limits  PRO B NATRIURETIC PEPTIDE - Abnormal; Notable for the following:    Pro B Natriuretic peptide (BNP) 3723.0 (*)    All other components within normal limits  POCT I-STAT TROPONIN I   Imaging Review Dg Chest 2 View  12/22/2012   CLINICAL DATA:  Shortness of breath, chest pain, hypertension, history heart failure  EXAM: CHEST  2 VIEW  COMPARISON:  08/01/2012  FINDINGS: Enlargement of cardiac silhouette with pulmonary vascular congestion.  Mild elongation of thoracic aorta.  Peribronchial thickening and slight accentuation perihilar markings question representing minimal failure.  More extensive infiltrate throughout right lung on the previous exam has for improved.  No pleural effusion or pneumothorax.  Bones unremarkable.  IMPRESSION: Enlargement of cardiac silhouette with pulmonary vascular congestion and question minimal failure.   Electronically Signed   By: Ulyses Southward M.D.   On: 12/22/2012 19:54    EKG Interpretation   None       MDM   1. Hypertension   2. Congestive heart failure    Her troponin 0.02. Her BNP is 3723. She has minimal pulmonary vascular congestion without  effusions on chest x-ray. Her blood pressure is elevated 180 systolic. She was given IV Lasix 40 mg. She was given Coreg 12.5 mg by mouth. She was given Vasotec 12.5 mg IV patient given morphine 4 IV and Zofran 4 IV. Her symptoms have improved. She is not dyspneic. She is well oxygenated. Her blood pressure is 147/73. Plan is discharge: Refill her medication and urged compliance    Roney Marion, MD 12/23/12 0021

## 2012-12-23 NOTE — ED Notes (Signed)
Pt given d/c instructions and verbalized understanding. NAD at this time. VS are stable.  

## 2012-12-27 ENCOUNTER — Telehealth: Payer: Self-pay | Admitting: Physician Assistant

## 2012-12-27 NOTE — Telephone Encounter (Signed)
Received page from answering service to call patient back. Got no answer. LMOM to call us back if needed. The text page said she was out of Lasix. I saw an RX on the chart from ER doctor 12/13/12 for Lasix 40mg  daily disp #30 with zero RF. She has been noncompliant with any outpatient followup and was last seen in the hospital 07/2012 by the CHF team. I called [facility] to find out if patient filled rx. Unfortunately she was unable to fill rx because that ER physician is not registered under Medicaid. Apparently I was not in the database either. If she calls back, would consider short term supply with rx from one of the [facility] physicians. Dayna Dunn PA-C

## 2012-12-28 ENCOUNTER — Encounter: Payer: Self-pay | Admitting: Cardiology

## 2012-12-28 ENCOUNTER — Telehealth (HOSPITAL_COMMUNITY): Payer: Self-pay | Admitting: Cardiology

## 2012-12-28 DIAGNOSIS — I509 Heart failure, unspecified: Secondary | ICD-10-CM

## 2012-12-28 MED ORDER — FUROSEMIDE 40 MG PO TABS: 40.0000 mg | ORAL_TABLET | Freq: Two times a day (BID) | ORAL | Status: DC

## 2012-12-28 NOTE — Telephone Encounter (Signed)
Follow up phone call to multiple messages left with phone service Pt was unable to get rx filled as ER physician was not registered with [place] medicaid Multiple No Shows with the CHF clinic--> pt given appt on 12/23 @ 11:20 Pt will only get # 5 lasix ash she MUST KEEP OV FOR FURTHER REFILLS

## 2012-12-28 NOTE — Progress Notes (Unsigned)
Patient ID: Laurie Wade, female   DOB: 09/06/1980, 32 y.o.   MRN: [id number] Telephone call from patient at 10pm requesting refill of Lasix.  Was in ED and received IV lasix on the 16th. Has not had oral Lasix for a week. Advised to be seen in CHF clinic in the am and to call for appt.  Darden Palmer MD [facility]

## 2012-12-29 ENCOUNTER — Encounter (HOSPITAL_COMMUNITY): Payer: Medicaid Other

## 2013-01-18 ENCOUNTER — Emergency Department (HOSPITAL_COMMUNITY): Payer: Medicaid Other

## 2013-01-18 ENCOUNTER — Encounter (HOSPITAL_COMMUNITY): Payer: Self-pay | Admitting: Emergency Medicine

## 2013-01-18 ENCOUNTER — Emergency Department (HOSPITAL_COMMUNITY)
Admission: EM | Admit: 2013-01-18 | Discharge: 2013-01-18 | Disposition: A | Discharge status: Home / Self Care | Payer: Medicaid Other | Attending: Emergency Medicine | Admitting: Emergency Medicine

## 2013-01-18 DIAGNOSIS — M7989 Other specified soft tissue disorders: Secondary | ICD-10-CM | POA: Insufficient documentation

## 2013-01-18 DIAGNOSIS — Z87891 Personal history of nicotine dependence: Secondary | ICD-10-CM | POA: Insufficient documentation

## 2013-01-18 DIAGNOSIS — I5043 Acute on chronic combined systolic (congestive) and diastolic (congestive) heart failure: Secondary | ICD-10-CM | POA: Insufficient documentation

## 2013-01-18 DIAGNOSIS — Z79899 Other long term (current) drug therapy: Secondary | ICD-10-CM | POA: Insufficient documentation

## 2013-01-18 DIAGNOSIS — R059 Cough, unspecified: Secondary | ICD-10-CM | POA: Insufficient documentation

## 2013-01-18 DIAGNOSIS — R609 Edema, unspecified: Secondary | ICD-10-CM | POA: Insufficient documentation

## 2013-01-18 DIAGNOSIS — I10 Essential (primary) hypertension: Secondary | ICD-10-CM | POA: Insufficient documentation

## 2013-01-18 DIAGNOSIS — R05 Cough: Secondary | ICD-10-CM | POA: Insufficient documentation

## 2013-01-18 DIAGNOSIS — R000 Tachycardia, unspecified: Secondary | ICD-10-CM | POA: Insufficient documentation

## 2013-01-18 LAB — CBC WITH DIFFERENTIAL/PLATELET
BASOS PCT: 0 % (ref 0–1)
Basophils Absolute: 0 10*3/uL (ref 0.0–0.1)
EOS ABS: 0.1 10*3/uL (ref 0.0–0.7)
Eosinophils Relative: 1 % (ref 0–5)
HCT: 34.2 % — ABNORMAL LOW (ref 36.0–46.0)
HEMOGLOBIN: 10.5 g/dL — AB (ref 12.0–15.0)
Lymphocytes Relative: 19 % (ref 12–46)
Lymphs Abs: 1.3 10*3/uL (ref 0.7–4.0)
MCH: 26.8 pg (ref 26.0–34.0)
MCHC: 30.7 g/dL (ref 30.0–36.0)
MCV: 87.2 fL (ref 78.0–100.0)
MONO ABS: 0.5 10*3/uL (ref 0.1–1.0)
Monocytes Relative: 7 % (ref 3–12)
Neutro Abs: 5.1 10*3/uL (ref 1.7–7.7)
Neutrophils Relative %: 73 % (ref 43–77)
Platelets: 187 10*3/uL (ref 150–400)
RBC: 3.92 MIL/uL (ref 3.87–5.11)
RDW: 17.2 % — AB (ref 11.5–15.5)
WBC: 6.9 10*3/uL (ref 4.0–10.5)

## 2013-01-18 LAB — COMPREHENSIVE METABOLIC PANEL
ALBUMIN: 3.7 g/dL (ref 3.5–5.2)
ALT: 33 U/L (ref 0–35)
AST: 25 U/L (ref 0–37)
Alkaline Phosphatase: 63 U/L (ref 39–117)
BILIRUBIN TOTAL: 0.5 mg/dL (ref 0.3–1.2)
BUN: 16 mg/dL (ref 6–23)
CO2: 23 mEq/L (ref 19–32)
CREATININE: 0.93 mg/dL (ref 0.50–1.10)
Calcium: 8.6 mg/dL (ref 8.4–10.5)
Chloride: 109 mEq/L (ref 96–112)
GFR calc Af Amer: >90 mL/min (ref >90)
GFR calc non Af Amer: 79 mL/min — ABNORMAL LOW (ref >90)
Glucose, Bld: 97 mg/dL (ref 70–99)
Potassium: 4 mEq/L (ref 3.7–5.3)
Sodium: 143 mEq/L (ref 137–147)
TOTAL PROTEIN: 6.4 g/dL (ref 6.0–8.3)

## 2013-01-18 LAB — POCT I-STAT TROPONIN I: TROPONIN I, POC: 0.03 ng/mL (ref 0.00–0.08)

## 2013-01-18 LAB — PRO B NATRIURETIC PEPTIDE: Pro B Natriuretic peptide (BNP): 3073 pg/mL — ABNORMAL HIGH (ref 0–125)

## 2013-01-18 MED ORDER — FUROSEMIDE 10 MG/ML IJ SOLN
40.0000 mg | Freq: Once | INTRAMUSCULAR | Status: AC
  Administered 2013-01-18: 40 mg via INTRAVENOUS
  Filled 2013-01-18: qty 4

## 2013-01-18 MED ORDER — IBUPROFEN 800 MG PO TABS
800.0000 mg | ORAL_TABLET | Freq: Once | ORAL | Status: AC
  Administered 2013-01-18: 800 mg via ORAL
  Filled 2013-01-18: qty 1

## 2013-01-18 MED ORDER — FUROSEMIDE 40 MG PO TABS: 40.0000 mg | ORAL_TABLET | Freq: Every day | ORAL | Status: DC

## 2013-01-18 NOTE — ED Notes (Signed)
Pt returned from radiology.

## 2013-01-18 NOTE — ED Notes (Signed)
Pt reports she feels like she can breath a little better. 

## 2013-01-18 NOTE — ED Notes (Signed)
Patient transported to X-ray 

## 2013-01-18 NOTE — ED Notes (Addendum)
Pt c/o sob that started on Saturday, initially just had a cough and progressively got worse. Pt sts when she tries to walk she can't catch her breath and sob worsens when lying down flat. Denies productive cough. Denies fever/chills at home. No hx of asthma. Hx of systolic heart failure, last saw cardiologist about a year ago, was admitted around then for sob and coughing up blood. Nad, skin warm and dry, resp e/u.

## 2013-01-18 NOTE — ED Notes (Signed)
Pt reports being out of her lasix for 1 week, hasn't gotten refill yet.

## 2013-01-18 NOTE — Discharge Instructions (Signed)
Heart Failure °Heart failure is a condition in which the heart has trouble pumping blood. This means your heart does not pump blood efficiently for your body to work well. In some cases of heart failure, fluid may back up into your lungs or you may have swelling (edema) in your lower legs. Heart failure is usually a long-term (chronic) condition. It is important for you to take good care of yourself and follow your caregiver's treatment plan. °CAUSES  °Some health conditions can cause heart failure. Those health conditions include: °· High blood pressure (hypertension) causes the heart muscle to work harder than normal. When pressure in the blood vessels is high, the heart needs to pump (contract) with more force in order to circulate blood throughout the body. High blood pressure eventually causes the heart to become stiff and weak. °· Coronary artery disease (CAD) is the buildup of cholesterol and fat (plaque) in the arteries of the heart. The blockage in the arteries deprives the heart muscle of oxygen and blood. This can cause chest pain and may lead to a heart attack. High blood pressure can also contribute to CAD. °· Heart attack (myocardial infarction) occurs when 1 or more arteries in the heart become blocked. The loss of oxygen damages the muscle tissue of the heart. When this happens, part of the heart muscle dies. The injured tissue does not contract as well and weakens the heart's ability to pump blood. °· Abnormal heart valves can cause heart failure when the heart valves do not open and close properly. This makes the heart muscle pump harder to keep the blood flowing. °· Heart muscle disease (cardiomyopathy or myocarditis) is damage to the heart muscle from a variety of causes. These can include drug or alcohol abuse, infections, or unknown reasons. These can increase the risk of heart failure. °· Lung disease makes the heart work harder because the lungs do not work properly. This can cause a strain  on the heart, leading it to fail. °· Diabetes increases the risk of heart failure. High blood sugar contributes to high fat (lipid) levels in the blood. Diabetes can also cause slow damage to tiny blood vessels that carry important nutrients to the heart muscle. When the heart does not get enough oxygen and food, it can cause the heart to become weak and stiff. This leads to a heart that does not contract efficiently. °· Other conditions can contribute to heart failure. These include abnormal heart rhythms, thyroid problems, and low blood counts (anemia). °Certain unhealthy behaviors can increase the risk of heart failure. Those unhealthy behaviors include: °· Being overweight. °· Smoking or chewing tobacco. °· Eating foods high in fat and cholesterol. °· Abusing illicit drugs or alcohol. °· Lacking physical activity. °SYMPTOMS  °Heart failure symptoms may vary and can be hard to detect. Symptoms may include: °· Shortness of breath with activity, such as climbing stairs. °· Persistent cough. °· Swelling of the feet, ankles, legs, or abdomen. °· Unexplained weight gain. °· Difficulty breathing when lying flat (orthopnea). °· Waking from sleep because of the need to sit up and get more air. °· Rapid heartbeat. °· Fatigue and loss of energy. °· Feeling lightheaded, dizzy, or close to fainting. °· Loss of appetite. °· Nausea. °· Increased urination during the night (nocturia). °DIAGNOSIS  °A diagnosis of heart failure is based on your history, symptoms, physical examination, and diagnostic tests. °Diagnostic tests for heart failure may include: °· Echocardiography. °· Electrocardiography. °· Chest X-ray. °· Blood tests. °· Exercise   stress test. °· Cardiac angiography. °· Radionuclide scans. °TREATMENT  °Treatment is aimed at managing the symptoms of heart failure. Medicines, behavioral changes, or surgical intervention may be necessary to treat heart failure. °· Medicines to help treat heart failure may  include: °· Angiotensin-converting enzyme (ACE) inhibitors. This type of medicine blocks the effects of a blood protein called angiotensin-converting enzyme. ACE inhibitors relax (dilate) the blood vessels and help lower blood pressure. °· Angiotensin receptor blockers. This type of medicine blocks the actions of a blood protein called angiotensin. Angiotensin receptor blockers dilate the blood vessels and help lower blood pressure. °· Water pills (diuretics). Diuretics cause the kidneys to remove salt and water from the blood. The extra fluid is removed through urination. This loss of extra fluid lowers the volume of blood the heart pumps. °· Beta blockers. These prevent the heart from beating too fast and improve heart muscle strength. °· Digitalis. This increases the force of the heartbeat. °· Healthy behavior changes include: °· Obtaining and maintaining a healthy weight. °· Stopping smoking or chewing tobacco. °· Eating heart healthy foods. °· Limiting or avoiding alcohol. °· Stopping illicit drug use. °· Physical activity as directed by your caregiver. °· Surgical treatment for heart failure may include: °· A procedure to open blocked arteries, repair damaged heart valves, or remove damaged heart muscle tissue. °· A pacemaker to improve heart muscle function and control certain abnormal heart rhythms. °· An internal cardioverter defibrillator to treat certain serious abnormal heart rhythms. °· A left ventricular assist device to assist the pumping ability of the heart. °HOME CARE INSTRUCTIONS  °· Take your medicine as directed by your caregiver. Medicines are important in reducing the workload of your heart, slowing the progression of heart failure, and improving your symptoms. °· Do not stop taking your medicine unless directed by your caregiver. °· Do not skip any dose of medicine. °· Refill your prescriptions before you run out of medicine. Your medicines are needed every day. °· Take over-the-counter  medicine only as directed by your caregiver or pharmacist. °· Engage in moderate physical activity if directed by your caregiver. Moderate physical activity can benefit some people. The elderly and people with severe heart failure should consult with a caregiver for physical activity recommendations. °· Eat heart healthy foods. Food choices should be free of trans fat and low in saturated fat, cholesterol, and salt (sodium). Healthy choices include fresh or frozen fruits and vegetables, fish, lean meats, legumes, fat-free or low-fat dairy products, and whole grain or high fiber foods. Talk to a dietitian to learn more about heart healthy foods. °· Limit sodium if directed by your caregiver. Sodium restriction may reduce symptoms of heart failure in some people. Talk to a dietitian to learn more about heart healthy seasonings. °· Use healthy cooking methods. Healthy cooking methods include roasting, grilling, broiling, baking, poaching, steaming, or stir-frying. Talk to a dietitian to learn more about healthy cooking methods. °· Limit fluids if directed by your caregiver. Fluid restriction may reduce symptoms of heart failure in some people. °· Weigh yourself every day. Daily weights are important in the early recognition of excess fluid. You should weigh yourself every morning after you urinate and before you eat breakfast. Wear the same amount of clothing each time you weigh yourself. Record your daily weight. Provide your caregiver with your weight record. °· Monitor and record your blood pressure if directed by your caregiver. °· Check your pulse if directed by your caregiver. °· Lose weight if directed   by your caregiver. Weight loss may reduce symptoms of heart failure in some people. °· Stop smoking or chewing tobacco. Nicotine makes your heart work harder by causing your blood vessels to constrict. Do not use nicotine gum or patches before talking to your caregiver. °· Schedule and attend follow-up visits as  directed by your caregiver. It is important to keep all your appointments. °· Limit alcohol intake to no more than 1 drink per day for nonpregnant women and 2 drinks per day for men. Drinking more than that is harmful to your heart. Tell your caregiver if you drink alcohol several times a week. Talk with your caregiver about whether alcohol is safe for you. If your heart has already been damaged by alcohol or you have severe heart failure, drinking alcohol should be stopped completely. °· Stop illicit drug use. °· Stay up-to-date with immunizations. It is especially important to prevent respiratory infections through current pneumococcal and influenza immunizations. °· Manage other health conditions such as hypertension, diabetes, thyroid disease, or abnormal heart rhythms as directed by your caregiver. °· Learn to manage stress. °· Plan rest periods when fatigued. °· Learn strategies to manage high temperatures. If the weather is extremely hot: °· Avoid vigorous physical activity. °· Use air conditioning or fans or seek a cooler location. °· Avoid caffeine and alcohol. °· Wear loose-fitting, lightweight, and light-colored clothing. °· Learn strategies to manage cold temperatures. If the weather is extremely cold: °· Avoid vigorous physical activity. °· Layer clothes. °· Wear mittens or gloves, a hat, and a scarf when going outside. °· Avoid alcohol. °· Obtain ongoing education and support as needed. °· Participate or seek rehabilitation as needed to maintain or improve independence and quality of life. °SEEK MEDICAL CARE IF:  °· Your weight increases by 03 lb/1.4 kg in 1 day or 05 lb/2.3 kg in a week. °· You have increasing shortness of breath that is unusual for you. °· You are unable to participate in your usual physical activities. °· You tire easily. °· You cough more than normal, especially with physical activity. °· You have any or more swelling in areas such as your hands, feet, ankles, or abdomen. °· You  are unable to sleep because it is hard to breathe. °· You feel like your heart is beating fast (palpitations). °· You become dizzy or lightheaded upon standing up. °SEEK IMMEDIATE MEDICAL CARE IF:  °· You have difficulty breathing. °· There is a change in mental status such as decreased alertness or difficulty with concentration. °· You have a pain or discomfort in your chest. °· You have an episode of fainting (syncope). °MAKE SURE YOU:  °· Understand these instructions. °· Will watch your condition. °· Will get help right away if you are not doing well or get worse. °Document Released: 12/24/2004 Document Revised: 04/20/2012 Document Reviewed: 01/16/2012 °[employer]® Patient Information ©2014 [employer]. ° °

## 2013-01-18 NOTE — ED Notes (Signed)
Pt states having shortness of breath and last nite could not lay down to sleep.  Pt has LE swelling and reports chest tightness

## 2013-01-18 NOTE — Progress Notes (Signed)
Spoke with Dr Gwendolyn GrantWalden in the ER concerning Mrs. Laurie Wade, pt here with CHF. Case was discussed with Dr Gala RomneyBensimhon who agreed pt could be dc'd from the ER and seen in the CHF clinic tomorrow at 3pm. Pt was instructed that she MUST show up for follow up appt. ER MD treated pt with lasix and will discharge to home with plan for her to follow up with Dr Gala RomneyBensimhon tomorrow.

## 2013-01-18 NOTE — ED Notes (Addendum)
Pt was able to ambulate independently up and down the hall   Pulse: 95  Oxygen: 90-92

## 2013-01-18 NOTE — ED Provider Notes (Signed)
CSN: [id number]     Arrival date & time 01/18/13  1120 History   First MD Initiated Contact with Patient 01/18/13 1158     Chief Complaint  Patient presents with  . Shortness of Breath   Patient is a 33 y.o. female presenting with shortness of breath. The history is provided by the patient.  Shortness of Breath Severity:  Moderate Onset quality:  Gradual Duration:  3 days Timing:  Constant Progression:  Worsening Chronicity:  New Context: activity   Relieved by:  Sitting up Exacerbated by: laying down. Ineffective treatments:  None tried Associated symptoms: cough   Associated symptoms: no abdominal pain, no chest pain, no fever and no vomiting   Risk factors comment:  History of NICM EF 35-40%   Past Medical History  Diagnosis Date  . Hypertension   . Chronic systolic heart failure 05/2011    a. NICM b. 5/13 LHC: nl cors c. 7/14 ECHO: EF 35-40% diff HK, MV calcified annulus, LA mild dil, MV triv regur, TV triv regur   Past Surgical History  Procedure Laterality Date  . Cesarean section    . Cholecystectomy    . Tubal ligation     Family History  Problem Relation Age of Onset  . Heart failure Mother   . Hypertension Mother    History  Substance Use Topics  . Smoking status: Former Games developermoker  . Smokeless tobacco: Former NeurosurgeonUser    Quit date: 06/06/2006  . Alcohol Use: No   OB History   Grav Para Term Preterm Abortions TAB SAB Ect Mult Living                 Review of Systems  Constitutional: Negative for fever and chills.  Respiratory: Positive for cough and shortness of breath.   Cardiovascular: Positive for leg swelling. Negative for chest pain.  Gastrointestinal: Negative for nausea, vomiting and abdominal pain.  All other systems reviewed and are negative.    Allergies  Doxycycline  Home Medications   Current Outpatient Rx  Name  Route  Sig  Dispense  Refill  . Aspirin-Salicylamide-Caffeine (ARTHRITIS STRENGTH BC POWDER PO)   Oral   Take 1 packet by  mouth daily as needed (pain).         . carvedilol (COREG) 12.5 MG tablet   Oral   Take 12.5 mg by mouth 2 (two) times daily with a meal.          . furosemide (LASIX) 40 MG tablet   Oral   Take 1 tablet (40 mg total) by mouth daily.   30 tablet   0   . ibuprofen (ADVIL,MOTRIN) 200 MG tablet   Oral   Take 600 mg by mouth every 6 (six) hours as needed for pain.         . potassium chloride SA (K-DUR,KLOR-CON) 20 MEQ tablet   Oral   Take 20 mEq by mouth daily.         . furosemide (LASIX) 40 MG tablet   Oral   Take 1 tablet (40 mg total) by mouth daily.   10 tablet   0    BP 149/125  Pulse 79  Temp(Src) 97.9 F (36.6 C) (Oral)  Resp 18  Wt 206 lb 2.1 oz (93.501 kg)  SpO2 96%  LMP 01/04/2013 Physical Exam  Nursing note and vitals reviewed. Constitutional: She is oriented to person, place, and time. She appears well-developed and well-nourished. No distress.  HENT:  Head: Normocephalic and atraumatic.  Eyes: Conjunctivae are normal. Pupils are equal, round, and reactive to light.  Neck: Normal range of motion. Neck supple.  Cardiovascular: Normal rate and regular rhythm.  Exam reveals no gallop and no friction rub.   No murmur heard. Pulmonary/Chest: Effort normal and breath sounds normal.  Abdominal: Soft. She exhibits no distension. There is no tenderness.  Musculoskeletal: Normal range of motion. She exhibits no tenderness. Edema: +1 LE symmetric pitting edema.  Neurological: She is alert and oriented to person, place, and time. She has normal strength and normal reflexes. No cranial nerve deficit or sensory deficit.  Skin: Skin is warm and dry.  Psychiatric: She has a normal mood and affect.    ED Course  Procedures (including critical care time) Labs Review Labs Reviewed  PRO B NATRIURETIC PEPTIDE - Abnormal; Notable for the following:    Pro B Natriuretic peptide (BNP) 3073.0 (*)    All other components within normal limits  CBC WITH DIFFERENTIAL -  Abnormal; Notable for the following:    Hemoglobin 10.5 (*)    HCT 34.2 (*)    RDW 17.2 (*)    All other components within normal limits  COMPREHENSIVE METABOLIC PANEL - Abnormal; Notable for the following:    GFR calc non Af Amer 79 (*)    All other components within normal limits  POCT I-STAT TROPONIN I   Imaging Review Dg Chest 2 View  01/18/2013   CLINICAL DATA:  SHORTNESS OF BREATH  EXAM: CHEST  2 VIEW  COMPARISON:  DG CHEST 2 VIEW dated 12/22/2012  FINDINGS: Stable enlarged cardiac silhouette. There is central venous pulmonary congestion similar prior. There is mild peribronchial cuffing unchanged from prior. No pleural fluid. No focal consolidation.  IMPRESSION: Cardiomegaly and mild pulmonary edema/ central venous congestion. No significant change from prior.   Electronically Signed   By: Genevive Bi M.D.   On: 01/18/2013 12:53    EKG Interpretation    Date/Time:  Monday January 18 2013 11:41:25 EST Ventricular Rate:  97 PR Interval:  154 QRS Duration: 168 QT Interval:  420 QTC Calculation: 533 R Axis:   88 Text Interpretation:  Normal sinus rhythm Biatrial enlargement Left bundle branch block Abnormal ECG Similar to prior Confirmed by Gwendolyn Grant  MD, BLAIR ([id number]) on 01/18/2013 12:01:16 PM           MDM   1. Heart failure, acute, combined     Here with SOB. Ran out of lasix 1 week ago. Has had associated non-productive cough but no fevers. Afebrile here. Mildly tachycardic. Dyspnea worsens with laying down and exertion. Has associated LE edema and swelling in bilateral hands. EKG without acute ischemic changes. CXR without focal infiltrate. Mild pulmonary edema. Hemoglobin stable. Doubt ACS. Doubt PE given no chest pain or significant risk factors and SOB can be explained by mild pulmonary edema from being out of lasix. Give 40 mg IV here. Spoke with the patient's cardiology Bensimhon who recommended restarting lasix and will see the patient tomorrow in follow up. The  patient ambulated prior to discharge with no hypoxia.     Shanon Ace, MD 01/18/13 2139

## 2013-01-19 ENCOUNTER — Encounter (HOSPITAL_COMMUNITY): Payer: Medicaid Other

## 2013-01-19 NOTE — ED Provider Notes (Signed)
I saw and evaluated the patient, reviewed the resident's note and I agree with the findings and plan.  EKG Interpretation    Date/Time:  Monday January 18 2013 11:41:25 EST Ventricular Rate:  97 PR Interval:  154 QRS Duration: 168 QT Interval:  420 QTC Calculation: 533 R Axis:   88 Text Interpretation:  Normal sinus rhythm Biatrial enlargement Left bundle branch block Abnormal ECG Similar to prior Confirmed by Gwendolyn GrantWALDEN  MD, Nikolina Simerson ([id number]) on 01/18/2013 12:01:16 PM            33 year old female presents with shortness of breath. She has history of nonischemic cardiomyopathy and heart failure. She has been off her Lasix for over a week. Patient here with some worsening dyspnea on exertion. Her vitals are stable here does not appear in any acute respiratory stress. She was given 40 mg Lasix  with multiple episodes the bathroom for urination. I spoke with cardiology will see her in the office tomorrow. She is given prescription for Lasix. She is stable for discharge for followup within 24 hrs  Dagmar HaitWilliam Evelina Lore, MD 01/19/13 [phone]

## 2013-01-25 ENCOUNTER — Other Ambulatory Visit (HOSPITAL_COMMUNITY): Payer: Self-pay | Admitting: Cardiology

## 2013-01-25 DIAGNOSIS — I5020 Unspecified systolic (congestive) heart failure: Secondary | ICD-10-CM

## 2013-01-25 MED ORDER — FUROSEMIDE 40 MG PO TABS
40.0000 mg | ORAL_TABLET | Freq: Every day | ORAL | Status: DC
Start: 2013-01-25 — End: 2013-02-06

## 2013-02-02 ENCOUNTER — Encounter (HOSPITAL_COMMUNITY): Payer: Medicaid Other

## 2013-02-06 ENCOUNTER — Encounter (HOSPITAL_COMMUNITY): Payer: Self-pay | Admitting: Emergency Medicine

## 2013-02-06 ENCOUNTER — Emergency Department (HOSPITAL_COMMUNITY)
Admission: EM | Admit: 2013-02-06 | Discharge: 2013-02-06 | Disposition: A | Discharge status: Home / Self Care | Payer: Medicaid Other | Attending: Emergency Medicine | Admitting: Emergency Medicine

## 2013-02-06 ENCOUNTER — Emergency Department (HOSPITAL_COMMUNITY): Payer: Medicaid Other

## 2013-02-06 DIAGNOSIS — I10 Essential (primary) hypertension: Secondary | ICD-10-CM | POA: Insufficient documentation

## 2013-02-06 DIAGNOSIS — D649 Anemia, unspecified: Secondary | ICD-10-CM | POA: Insufficient documentation

## 2013-02-06 DIAGNOSIS — E876 Hypokalemia: Secondary | ICD-10-CM | POA: Insufficient documentation

## 2013-02-06 DIAGNOSIS — I5022 Chronic systolic (congestive) heart failure: Secondary | ICD-10-CM | POA: Insufficient documentation

## 2013-02-06 DIAGNOSIS — J069 Acute upper respiratory infection, unspecified: Secondary | ICD-10-CM | POA: Insufficient documentation

## 2013-02-06 DIAGNOSIS — Z87891 Personal history of nicotine dependence: Secondary | ICD-10-CM | POA: Insufficient documentation

## 2013-02-06 DIAGNOSIS — Z79899 Other long term (current) drug therapy: Secondary | ICD-10-CM | POA: Insufficient documentation

## 2013-02-06 LAB — COMPREHENSIVE METABOLIC PANEL
ALT: 10 U/L (ref 0–35)
AST: 12 U/L (ref 0–37)
Albumin: 3.7 g/dL (ref 3.5–5.2)
Alkaline Phosphatase: 62 U/L (ref 39–117)
BILIRUBIN TOTAL: 0.3 mg/dL (ref 0.3–1.2)
BUN: 8 mg/dL (ref 6–23)
CHLORIDE: 102 meq/L (ref 96–112)
CO2: 27 meq/L (ref 19–32)
CREATININE: 0.78 mg/dL (ref 0.50–1.10)
Calcium: 8.8 mg/dL (ref 8.4–10.5)
GFR calc non Af Amer: >90 mL/min (ref >90)
Glucose, Bld: 99 mg/dL (ref 70–99)
Potassium: 3.2 mEq/L — ABNORMAL LOW (ref 3.7–5.3)
Sodium: 142 mEq/L (ref 137–147)
Total Protein: 6.4 g/dL (ref 6.0–8.3)

## 2013-02-06 LAB — CBC WITH DIFFERENTIAL/PLATELET
BASOS ABS: 0 10*3/uL (ref 0.0–0.1)
BASOS PCT: 0 % (ref 0–1)
Eosinophils Absolute: 0.2 10*3/uL (ref 0.0–0.7)
Eosinophils Relative: 2 % (ref 0–5)
HCT: 32.7 % — ABNORMAL LOW (ref 36.0–46.0)
HEMOGLOBIN: 10.1 g/dL — AB (ref 12.0–15.0)
Lymphocytes Relative: 16 % (ref 12–46)
Lymphs Abs: 1.5 10*3/uL (ref 0.7–4.0)
MCH: 26.3 pg (ref 26.0–34.0)
MCHC: 30.9 g/dL (ref 30.0–36.0)
MCV: 85.2 fL (ref 78.0–100.0)
MONOS PCT: 7 % (ref 3–12)
Monocytes Absolute: 0.6 10*3/uL (ref 0.1–1.0)
NEUTROS ABS: 6.8 10*3/uL (ref 1.7–7.7)
NEUTROS PCT: 75 % (ref 43–77)
Platelets: 221 10*3/uL (ref 150–400)
RBC: 3.84 MIL/uL — AB (ref 3.87–5.11)
RDW: 16.8 % — ABNORMAL HIGH (ref 11.5–15.5)
WBC: 9.1 10*3/uL (ref 4.0–10.5)

## 2013-02-06 LAB — RAPID STREP SCREEN (MED CTR MEBANE ONLY): Streptococcus, Group A Screen (Direct): NEGATIVE

## 2013-02-06 LAB — PRO B NATRIURETIC PEPTIDE: Pro B Natriuretic peptide (BNP): 1997 pg/mL — ABNORMAL HIGH (ref 0–125)

## 2013-02-06 LAB — TROPONIN I

## 2013-02-06 MED ORDER — HYDROCOD POLST-CHLORPHEN POLST 10-8 MG/5ML PO LQCR
5.0000 mL | Freq: Once | ORAL | Status: AC
  Administered 2013-02-06: 5 mL via ORAL
  Filled 2013-02-06: qty 5

## 2013-02-06 MED ORDER — HYDROCOD POLST-CHLORPHEN POLST 10-8 MG/5ML PO LQCR: 5.0000 mL | Freq: Two times a day (BID) | ORAL | Status: DC | PRN

## 2013-02-06 MED ORDER — POTASSIUM CHLORIDE CRYS ER 20 MEQ PO TBCR
20.0000 meq | EXTENDED_RELEASE_TABLET | Freq: Once | ORAL | Status: AC
  Administered 2013-02-06: 20 meq via ORAL
  Filled 2013-02-06: qty 1

## 2013-02-06 NOTE — ED Provider Notes (Signed)
CSN: [id number]     Arrival date & time 02/06/13  [id number] History  This chart was scribed for Coral CeoJessica Kjirsten Bloodgood, PA working with Suzi RootsKevin E Steinl, MD, by Lindajo Royallujie Ifegwu ED Scribe. This patient was seen in room TR09C/TR09C and the patient's care was started at 10:25 AM.    Chief Complaint  Patient presents with  . Cough    The history is provided by the patient. No language interpreter was used.    HPI Comments: Laurie Wade is a 33 y.o. female with a h/o heart failure and hypertension who presents to the Emergency Department complaining of cough productive of green and brown sputum for three days. No hemoptysis. Pt also reports associated rhinorhea, itchy and sore throat, intermittent sharp chest pain, nasal congestion and headache. Chest pain is located in the mid-sternal region without radiation and is present with coughing only. Pt denies SOB, fatigue, abdominal pain, dysuria, body aches, leg swelling, and fever. Pt reports that she is on Lasix and denies recent missed doses. Patient states she has tried several over the counter cold and anti-cough medications which have not been helping her symptoms. Pt also reports that she did not take he BP medication this morning. Patient denies any hx of DVT/PE/clotting. No recent travel/hx of cancer/surgeries. No estrogen/birth control. Patient seen recently in the ED (01/18/13) for SOB which she states was due to not taking her lasix. States symptoms today are different.    Past Medical History  Diagnosis Date  . Hypertension   . Chronic systolic heart failure 05/2011    a. NICM b. 5/13 LHC: nl cors c. 7/14 ECHO: EF 35-40% diff HK, MV calcified annulus, LA mild dil, MV triv regur, TV triv regur   Past Surgical History  Procedure Laterality Date  . Cesarean section    . Cholecystectomy    . Tubal ligation     Family History  Problem Relation Age of Onset  . Heart failure Mother   . Hypertension Mother    History  Substance Use Topics  . Smoking  status: Former Games developermoker  . Smokeless tobacco: Former NeurosurgeonUser    Quit date: 06/06/2006  . Alcohol Use: No   OB History   Grav Para Term Preterm Abortions TAB SAB Ect Mult Living                 Review of Systems  Constitutional: Negative for fever.  HENT: Positive for congestion, sneezing and sore throat.   Respiratory: Positive for cough.   Cardiovascular: Positive for chest pain.  Gastrointestinal: Negative for nausea, vomiting, diarrhea and constipation.  Neurological: Positive for headaches.    Allergies  Doxycycline  Home Medications   Current Outpatient Rx  Name  Route  Sig  Dispense  Refill  . carvedilol (COREG) 12.5 MG tablet   Oral   Take 12.5 mg by mouth 2 (two) times daily with a meal.          . furosemide (LASIX) 40 MG tablet   Oral   Take 1 tablet (40 mg total) by mouth daily.   30 tablet   0   . ibuprofen (ADVIL,MOTRIN) 200 MG tablet   Oral   Take 600 mg by mouth every 6 (six) hours as needed for pain.         . potassium chloride SA (K-DUR,KLOR-CON) 20 MEQ tablet   Oral   Take 20 mEq by mouth daily.          Triage Vitals: BP 149/107  Pulse  96  Temp(Src) 98.1 F (36.7 C) (Oral)  Resp 17  Ht 5\' 5"  (1.651 m)  Wt 196 lb 14.4 oz (89.313 kg)  BMI 32.77 kg/m2  SpO2 98%  LMP 01/04/2013  Filed Vitals:   02/06/13 0953 02/06/13 1456  BP: 149/107 153/100  Pulse: 96 87  Temp: 98.1 F (36.7 C) 97.5 F (36.4 C)  TempSrc: Oral Oral  Resp: 17   Height: 5\' 5"  (1.651 m)   Weight: 196 lb 14.4 oz (89.313 kg)   SpO2: 98% 95%    Physical Exam  Nursing note and vitals reviewed. Constitutional: She is oriented to person, place, and time. She appears well-developed and well-nourished. No distress.  HENT:  Head: Normocephalic and atraumatic.  Right Ear: External ear normal.  Left Ear: External ear normal.  Mouth/Throat: Oropharynx is clear and moist. No oropharyngeal exudate.  Nasal congestion. Tympanic membranes gray and translucent bilaterally  with no erythema, edema, or hemotympanum. Mild erythema to the posterior pharynx. No exudates or edema. No trismus. Uvula midline.   Eyes: Conjunctivae are normal. Pupils are equal, round, and reactive to light. Right eye exhibits no discharge. Left eye exhibits no discharge.  Neck: Normal range of motion.  No LAD.   Cardiovascular: Normal rate, regular rhythm, normal heart sounds and intact distal pulses.  Exam reveals no gallop and no friction rub.   No murmur heard. Pulmonary/Chest: Effort normal and breath sounds normal. No respiratory distress. She has no wheezes. She has no rales. She exhibits tenderness.  Mid-sternal chest tenderness to palpation. Intermittent dry cough throughout exam.   Abdominal: Soft. Bowel sounds are normal. She exhibits no distension and no mass. There is no tenderness. There is no rebound and no guarding.  Musculoskeletal: Normal range of motion. She exhibits no edema and no tenderness.  No LE leg edema or calf tenderness bilaterally  Neurological: She is alert and oriented to person, place, and time.  Skin: Skin is warm and dry. She is not diaphoretic.    ED Course  Procedures (including critical care time)  DIAGNOSTIC STUDIES: Oxygen Saturation is 98% on RA, normal by my interpretation.    COORDINATION OF CARE: 10:38 AM- Pt advised of plan for treatment and pt agrees.  Labs Review Labs Reviewed  CBC WITH DIFFERENTIAL - Abnormal; Notable for the following:    RBC 3.84 (*)    Hemoglobin 10.1 (*)    HCT 32.7 (*)    RDW 16.8 (*)    All other components within normal limits  COMPREHENSIVE METABOLIC PANEL - Abnormal; Notable for the following:    Potassium 3.2 (*)    All other components within normal limits  PRO B NATRIURETIC PEPTIDE - Abnormal; Notable for the following:    Pro B Natriuretic peptide (BNP) 1997.0 (*)    All other components within normal limits  RAPID STREP SCREEN  TROPONIN I   Imaging Review Dg Chest 2 View (if Patient Has Fever  And/or Copd)  02/06/2013   CLINICAL DATA:  Cough for 4 days.  EXAM: CHEST  2 VIEW  COMPARISON:  01/18/2013  FINDINGS: Cardiac silhouette is mildly enlarged. Normal mediastinal and hilar contours. Clear lungs. No pleural effusion or pneumothorax.  Normal bony thorax.  IMPRESSION: Mild cardiomegaly.  Clear lungs.  No acute findings.   Electronically Signed   By: Amie Portland M.D.   On: 02/06/2013 10:54    EKG Interpretation    Date/Time:  Saturday February 06 2013 10:39:25 EST Ventricular Rate:  91 PR Interval:  162 QRS Duration: 174 QT Interval:  436 QTC Calculation: 536 R Axis:   140 Text Interpretation:  Normal sinus rhythm with sinus arrhythmia Biatrial enlargement Left bundle branch block No significant change since last tracing Confirmed by Denton Lank  MD, KEVIN ([id number]) on 02/06/2013 10:47:58 AM           Results for orders placed during the hospital encounter of 02/06/13  RAPID STREP SCREEN      Result Value Range   Streptococcus, Group A Screen (Direct) NEGATIVE  NEGATIVE  CULTURE, GROUP A STREP      Result Value Range   Specimen Description THROAT     Special Requests NONE     Culture       Value: NO SUSPICIOUS COLONIES, CONTINUING TO HOLD     Performed at [facility]   Report Status PENDING    CBC WITH DIFFERENTIAL      Result Value Range   WBC 9.1  4.0 - 10.5 K/uL   RBC 3.84 (*) 3.87 - 5.11 MIL/uL   Hemoglobin 10.1 (*) 12.0 - 15.0 g/dL   HCT 04.5 (*) 40.9 - 81.1 %   MCV 85.2  78.0 - 100.0 fL   MCH 26.3  26.0 - 34.0 pg   MCHC 30.9  30.0 - 36.0 g/dL   RDW 91.4 (*) 78.2 - 95.6 %   Platelets 221  150 - 400 K/uL   Neutrophils Relative % 75  43 - 77 %   Neutro Abs 6.8  1.7 - 7.7 K/uL   Lymphocytes Relative 16  12 - 46 %   Lymphs Abs 1.5  0.7 - 4.0 K/uL   Monocytes Relative 7  3 - 12 %   Monocytes Absolute 0.6  0.1 - 1.0 K/uL   Eosinophils Relative 2  0 - 5 %   Eosinophils Absolute 0.2  0.0 - 0.7 K/uL   Basophils Relative 0  0 - 1 %   Basophils Absolute 0.0   0.0 - 0.1 K/uL  COMPREHENSIVE METABOLIC PANEL      Result Value Range   Sodium 142  137 - 147 mEq/L   Potassium 3.2 (*) 3.7 - 5.3 mEq/L   Chloride 102  96 - 112 mEq/L   CO2 27  19 - 32 mEq/L   Glucose, Bld 99  70 - 99 mg/dL   BUN 8  6 - 23 mg/dL   Creatinine, Ser 2.13  0.50 - 1.10 mg/dL   Calcium 8.8  8.4 - 08.6 mg/dL   Total Protein 6.4  6.0 - 8.3 g/dL   Albumin 3.7  3.5 - 5.2 g/dL   AST 12  0 - 37 U/L   ALT 10  0 - 35 U/L   Alkaline Phosphatase 62  39 - 117 U/L   Total Bilirubin 0.3  0.3 - 1.2 mg/dL   GFR calc non Af Amer >90  >90 mL/min   GFR calc Af Amer >90  >90 mL/min  TROPONIN I      Result Value Range   Troponin I <0.30  <0.30 ng/mL  PRO B NATRIURETIC PEPTIDE      Result Value Range   Pro B Natriuretic peptide (BNP) 1997.0 (*) 0 - 125 pg/mL      DG Chest 2 View (if patient has fever and/or COPD) (Final result)  Result time: 02/06/13 10:54:57    Final result by Rad Results In Interface (02/06/13 10:54:57)    Narrative:   CLINICAL DATA: Cough for 4 days.  EXAM: CHEST 2 VIEW  COMPARISON: 01/18/2013  FINDINGS: Cardiac silhouette is mildly enlarged. Normal mediastinal and hilar contours. Clear lungs. No pleural effusion or pneumothorax.  Normal bony thorax.  IMPRESSION: Mild cardiomegaly.  Clear lungs. No acute findings.   Electronically Signed By: Amie Portland M.D. On: 02/06/2013 10:54    MDM   Laurie Wade is a 33 y.o. female with a h/o heart failure and hypertension who presents to the Emergency Department complaining of cough productive of green and brown sputum for three days.   Rechecks  2:45 PM = Patient states she feels better after Tussionex. Cough suppressed.    Etiology of cough likely due to a URI vs viral syndrome. Patient afebrile and non-toxic in appearance. Chest x-ray negative for an acute cardiopulmonary process. Negative rapid strep. EKG negative for any acute ischemic changes.  Chest pain reproducible. Troponin negative. BNP  improved from baseline. Wells score 0. PERC negative. No hypoxia or tachycardia. Patient found to have anemia which is at baseline. She has mild hypokalemia and was supplemented in the ED. Patient has a hx of hypokalemia and is on potassium supplements. Patient encouraged to eat a diet rich in potassium and continue potassium supplements. Patient had improvements in her symptoms with Tussionex and given a prescription for this. Patient encouraged to follow-up with her PCP. Return precautions, discharge instructions, and follow-up was discussed with the patient before discharge. Patient obtaining a ride home from the ED.    Discharge Medication List as of 02/06/2013  2:58 PM    START taking these medications   Details  chlorpheniramine-HYDROcodone (TUSSIONEX PENNKINETIC ER) 10-8 MG/5ML LQCR Take 5 mLs by mouth every 12 (twelve) hours as needed for cough (Cough)., Starting 02/06/2013, Until Discontinued, Print        Final impressions: 1. URI (upper respiratory infection)   2. Hypokalemia   3. Anemia      Luiz Iron PA-C   I personally performed the services described in this documentation, which was scribed in my presence. The recorded information has been reviewed and is accurate.         Jillyn Ledger, PA-C 02/08/13 [phone]

## 2013-02-06 NOTE — Discharge Instructions (Signed)
Take tussionex for cough - Please be careful with this medication.  It can cause drowsiness.  Use caution while driving, operating machinery, drinking alcohol, or any other activities that may impair your physical or mental abilities.   Drink plenty of fluids and rest  Eat a diet rich in potassium and continue to take potassium supplements Return to the emergency department if you develop any changing/worsening condition, coughing up blood, chest pain, shortness of breath, difficulty breathing, fever, or any other concerns (please read additional information regarding your condition below)    Cough, Adult  A cough is a reflex that helps clear your throat and airways. It can help heal the body or may be a reaction to an irritated airway. A cough may only last 2 or 3 weeks (acute) or may last more than 8 weeks (chronic).  CAUSES Acute cough:  Viral or bacterial infections. Chronic cough:  Infections.  Allergies.  Asthma.  Post-nasal drip.  Smoking.  Heartburn or acid reflux.  Some medicines.  Chronic lung problems (COPD).  Cancer. SYMPTOMS   Cough.  Fever.  Chest pain.  Increased breathing rate.  High-pitched whistling sound when breathing (wheezing).  Colored mucus that you cough up (sputum). TREATMENT   A bacterial cough may be treated with antibiotic medicine.  A viral cough must run its course and will not respond to antibiotics.  Your caregiver may recommend other treatments if you have a chronic cough. HOME CARE INSTRUCTIONS   Only take over-the-counter or prescription medicines for pain, discomfort, or fever as directed by your caregiver. Use cough suppressants only as directed by your caregiver.  Use a cold steam vaporizer or humidifier in your bedroom or home to help loosen secretions.  Sleep in a semi-upright position if your cough is worse at night.  Rest as needed.  Stop smoking if you smoke. SEEK IMMEDIATE MEDICAL CARE IF:   You have pus in  your sputum.  Your cough starts to worsen.  You cannot control your cough with suppressants and are losing sleep.  You begin coughing up blood.  You have difficulty breathing.  You develop pain which is getting worse or is uncontrolled with medicine.  You have a fever. MAKE SURE YOU:   Understand these instructions.  Will watch your condition.  Will get help right away if you are not doing well or get worse. Document Released: 06/22/2010 Document Revised: 03/18/2011 Document Reviewed: 06/22/2010 [facility] Patient Information 2014 [employer], [place].  Upper Respiratory Infection, Adult An upper respiratory infection (URI) is also known as the common cold. It is often caused by a type of germ (virus). Colds are easily spread (contagious). You can pass it to others by kissing, coughing, sneezing, or drinking out of the same glass. Usually, you get better in 1 or 2 weeks.  HOME CARE   Only take medicine as told by your doctor.  Use a warm mist humidifier or breathe in steam from a hot shower.  Drink enough water and fluids to keep your pee (urine) clear or pale yellow.  Get plenty of rest.  Return to work when your temperature is back to normal or as told by your doctor. You may use a face mask and wash your hands to stop your cold from spreading. GET HELP RIGHT AWAY IF:   After the first few days, you feel you are getting worse.  You have questions about your medicine.  You have chills, shortness of breath, or brown or red spit (mucus).  You have  yellow or brown snot (nasal discharge) or pain in the face, especially when you bend forward.  You have a fever, puffy (swollen) neck, pain when you swallow, or white spots in the back of your throat.  You have a bad headache, ear pain, sinus pain, or chest pain.  You have a high-pitched whistling sound when you breathe in and out (wheezing).  You have a lasting cough or cough up blood.  You have sore muscles or a stiff  neck. MAKE SURE YOU:   Understand these instructions.  Will watch your condition.  Will get help right away if you are not doing well or get worse. Document Released: 06/12/2007 Document Revised: 03/18/2011 Document Reviewed: 04/30/2010 [facility] Patient Information 2014 [place], [place].  Hypokalemia Hypokalemia means that the amount of potassium in the blood is lower than normal.Potassium is a chemical, called an electrolyte, that helps regulate the amount of fluid in the body. It also stimulates muscle contraction and helps nerves function properly.Most of the body's potassium is inside of cells, and only a very small amount is in the blood. Because the amount in the blood is so small, minor changes can be life-threatening. CAUSES  Antibiotics.  Diarrhea or vomiting.  Using laxatives too much, which can cause diarrhea.  Chronic kidney disease.  Water pills (diuretics).  Eating disorders (bulimia).  Low magnesium level.  Sweating a lot. SIGNS AND SYMPTOMS  Weakness.  Constipation.  Fatigue.  Muscle cramps.  Mental confusion.  Skipped heartbeats or irregular heartbeat (palpitations).  Tingling or numbness. DIAGNOSIS  Your health care provider can diagnose hypokalemia with blood tests. In addition to checking your potassium level, your health care provider may also check other lab tests. TREATMENT Hypokalemia can be treated with potassium supplements taken by mouth or adjustments in your current medicines. If your potassium level is very low, you may need to get potassium through a vein (IV) and be monitored in the hospital. A diet high in potassium is also helpful. Foods high in potassium are:  Nuts, such as peanuts and pistachios.  Seeds, such as sunflower seeds and pumpkin seeds.  Peas, lentils, and lima beans.  Whole grain and bran cereals and breads.  Fresh fruit and vegetables, such as apricots, avocado, bananas, cantaloupe, kiwi, oranges, tomatoes,  asparagus, and potatoes.  Orange and tomato juices.  Red meats.  Fruit yogurt. HOME CARE INSTRUCTIONS  Take all medicines as prescribed by your health care provider.  Maintain a healthy diet by including nutritious food, such as fruits, vegetables, nuts, whole grains, and lean meats.  If you are taking a laxative, be sure to follow the directions on the label. SEEK MEDICAL CARE IF:  Your weakness gets worse.  You feel your heart pounding or racing.  You are vomiting or having diarrhea.  You are diabetic and having trouble keeping your blood glucose in the normal range. SEEK IMMEDIATE MEDICAL CARE IF:  You have chest pain, shortness of breath, or dizziness.  You are vomiting or having diarrhea for more than 2 days.  You faint. MAKE SURE YOU:   Understand these instructions.  Will watch your condition.  Will get help right away if you are not doing well or get worse. Document Released: 12/24/2004 Document Revised: 10/14/2012 Document Reviewed: 06/26/2012 [facility] Patient Information 2014 [place], [place].  Anemia, Nonspecific Anemia is a condition in which the concentration of red blood cells or hemoglobin in the blood is below normal. Hemoglobin is a substance in red blood cells that carries oxygen to  the tissues of the body. Anemia results in not enough oxygen reaching these tissues.  CAUSES  Common causes of anemia include:   Excessive bleeding. Bleeding may be internal or external. This includes excessive bleeding from periods (in women) or from the intestine.   Poor nutrition.   Chronic kidney, thyroid, and liver disease.  Bone marrow disorders that decrease red blood cell production.  Cancer and treatments for cancer.  HIV, AIDS, and their treatments.  Spleen problems that increase red blood cell destruction.  Blood disorders.  Excess destruction of red blood cells due to infection, medicines, and autoimmune disorders. SIGNS AND SYMPTOMS   Minor  weakness.   Dizziness.   Headache.  Palpitations.   Shortness of breath, especially with exercise.   Paleness.  Cold sensitivity.  Indigestion.  Nausea.  Difficulty sleeping.  Difficulty concentrating. Symptoms may occur suddenly or they may develop slowly.  DIAGNOSIS  Additional blood tests are often needed. These help your health care provider determine the best treatment. Your health care provider will check your stool for blood and look for other causes of blood loss.  TREATMENT  Treatment varies depending on the cause of the anemia. Treatment can include:   Supplements of iron, vitamin B12, or folic acid.   Hormone medicines.   A blood transfusion. This may be needed if blood loss is severe.   Hospitalization. This may be needed if there is significant continual blood loss.   Dietary changes.  Spleen removal. HOME CARE INSTRUCTIONS Keep all follow-up appointments. It often takes many weeks to correct anemia, and having your health care provider check on your condition and your response to treatment is very important. SEEK IMMEDIATE MEDICAL CARE IF:   You develop extreme weakness, shortness of breath, or chest pain.   You become dizzy or have trouble concentrating.  You develop heavy vaginal bleeding.   You develop a rash.   You have bloody or black, tarry stools.   You faint.   You vomit up blood.   You vomit repeatedly.   You have abdominal pain.  You have a fever or persistent symptoms for more than 2 3 days.   You have a fever and your symptoms suddenly get worse.   You are dehydrated.  MAKE SURE YOU:  Understand these instructions.  Will watch your condition.  Will get help right away if you are not doing well or get worse. Document Released: 02/01/2004 Document Revised: 08/26/2012 Document Reviewed: 06/19/2012 [facility] Patient Information 2014 [place], [place].   Emergency Department Resource Guide 1) Find a  Doctor and Pay Out of Pocket Although you won't have to find out who is covered by your insurance plan, it is a good idea to ask around and get recommendations. You will then need to call the office and see if the doctor you have chosen will accept you as a new patient and what types of options they offer for patients who are self-pay. Some doctors offer discounts or will set up payment plans for their patients who do not have insurance, but you will need to ask so you aren't surprised when you get to your appointment.  2) Contact Your Local Health Department Not all health departments have doctors that can see patients for sick visits, but many do, so it is worth a call to see if yours does. If you don't know where your local health department is, you can check in your phone book. The CDC also has a tool to help you locate  your state's health department, and many state websites also have listings of all of their local health departments.  3) Find a Walk-in Clinic If your illness is not likely to be very severe or complicated, you may want to try a walk in clinic. These are popping up all over the country in pharmacies, drugstores, and shopping centers. They're usually staffed by nurse practitioners or physician assistants that have been trained to treat common illnesses and complaints. They're usually fairly quick and inexpensive. However, if you have serious medical issues or chronic medical problems, these are probably not your best option.  No Primary Care Doctor: - Call Health Connect at  [phone] - they can help you locate a primary care doctor that  accepts your insurance, provides certain services, etc. - Physician Referral Service- [phone]  Chronic Pain Problems: Organization         Address  Phone   Notes  [facility]  [phone] Patients need to be referred by their primary care doctor.   Medication Assistance: Organization         Address  Phone    Notes  [facility] Medication [facility] [address] [place], [place] [address] [phone] --Must be a resident of [facility] -- Must have NO insurance coverage whatsoever (no Medicaid/ Medicare, etc.) -- The pt. MUST have a primary care doctor that directs their care regularly and follows them in the community   MedAssist  [phone]   [employer]  [phone]    Agencies that provide inexpensive medical care: Organization         Address  Phone   Notes  [facility]  [phone]   [facility]    [phone]   [facility] [address] [place], [place] [address] [phone]   [facility] [address] [place], [place] [phone]   Planned Parenthood    [phone]   [facility]    [phone]   [facility]  [address], [place] Phone:  [phone], Fax:  [phone] Hours of Operation:  9 am - 6 pm, M-F.  Also accepts Medicaid/Medicare and self-pay.  [facility] Children  [address], [place] Phone: [phone], Fax: [phone]. Hours of Operation:  8:30 am - 5:30 pm, M-F.  Also accepts Medicaid and self-pay.  [facility] [place] [address] [place] Phone: [phone]   [facility] [address] Natasha Bence [place], [place] [phone], Ext. [phone] Mondays & Thursdays: 7-9 AM.  First 15 patients are seen on a first come, first serve basis.    Medicaid-accepting [facility] Providers:  Organization         Address  Phone   Notes  [facility] [address],  [phone] Also accepts self-pay patients.  [facility] [address] Laurell Josephs [place], [place]  [phone]   [facility] [address], [place] [phone]   [facility] Family  Medicine [address] [place] [phone]   Renaye Rakers [address], [place]   [phone] Only accepts [place] patients after they have their name applied to their card.   Self-Pay (no insurance) in [facility]:  Organization  Address  Phone   Notes  Sickle Cell Patients, [facility] Internal [facility] [place] [phone]   [facility] Urgent Care [place] [phone]   [facility] [place]  [place], [address], [place] [phone]   [facility]/Dr. Osei-Bonsu  [address] [place] or [address], [place] [phone] Phone number for both [place] and [place] locations is the same.  [facility]gent Medical and [facility] [address], [place] [phone]   [facility] [address], [place] or [address] [phone]   [facility] [address] [place] [phone], phone; [phone], fax Sees patients 1st and 3rd Saturday of every month.  Must not qualify for public or private insurance (i.e. Medicaid, Medicare, [place] Health Choice, Veterans' Benefits)  Household income should be no more than 200% of the poverty level The clinic cannot treat you if you are pregnant or think you are pregnant  Sexually transmitted diseases are not treated at the clinic.    Dental Care: Organization         Address  Phone  Notes  [facility] Department of [facility] [phone] Accepts children up to age 60 who are enrolled in [place] or [place]; pregnant women with a Medicaid card; and children who have applied for Medicaid or [place] Health Choice, but were declined, whose parents can pay a reduced fee at time of service.  [facility] Department of [facility]  [address], [place] [phone] Accepts children up to age 67 who are enrolled in [place] or [place]; pregnant women with a Medicaid card; and children who have applied for Medicaid or [place] Health Choice, but were declined, whose parents can pay a reduced fee at time of service.  [place] Adult [facility] Access PROGRAM  [place] [phone] Patients are seen by appointment only. Walk-ins are not accepted. [place] will see patients 69 years of age and older. Monday - Tuesday (8am-5pm) Most Wednesdays (8:30-5pm) $30 per visit, cash only  [facility] Adult Dental Access PROGRAM  [address], [facility] [phone] Patients are seen by appointment only. Walk-ins are not accepted. [place] will see patients 20 years of age and older. One Wednesday Evening (Monthly: Volunteer Based).  $30 per visit, cash only  [place]  [phone] for adults; Children under age 28, call Graduate Pediatric Dentistry at [phone]. Children aged 42-14, please call [phone] to request a pediatric application.  Dental services are provided in all areas of dental care including fillings, crowns and bridges, complete and partial dentures, implants, gum treatment, root canals, and extractions. Preventive care is also provided. Treatment is provided to both adults and children. Patients are selected via a lottery and there is often a waiting list.   [facility] [address] [place]  [phone] [email or web address]   [facility] [address] [place], [place] [phone], Ext. [phone] Second and Fourth Thursday of each month, opens at 6:30 AM; Clinic ends at 9 AM.  Patients are seen on a first-come first-served basis, and a limited number are seen during each clinic.   [facility]  [address], [place],  [place] [phone]   Eligibility Requirements You must have lived in  [place], or [place] counties for at least the last three months.   You cannot be eligible for state or federal sponsored [employer], including [employer], [place], or [employer].   You generally cannot be eligible for healthcare insurance through your employer.    How to apply: Eligibility screenings are held every Tuesday and Wednesday afternoon from 1:00 pm until 4:00 pm. You do not need an appointment for the interview!  [facility] [address] [place], [place]   [place]  [place] Department  [place]  [phone]    Behavioral Health Resources in the Community: Intensive Outpatient Programs Organization         Address  Phone  Notes  [place]. [address] [place], [place] [phone]   [facility] Outpatient [address] [place], [place]   ADS: Alcohol & Drug Svcs [address] [place], [place]   [place] [address]  [place], [place] or [phone]   Substance Abuse Resources Organization         Address  Phone  Notes  Alcohol and Drug Services  [phone]   [place]  [phone]   [facility] [place]   Chinita Pester  [phone]   Residential & Outpatient Substance Abuse Program  [phone]   Psychological Services Organization         Address  Phone  Notes  [facility]  [place]  [phone]   [place] [address], [place] or [phone]    Mobile Crisis Teams Organization         Address  Phone  Notes  [facility], Mobile Crisis Care Unit  [phone]   [facility]  [address] [place], [place]   [place] [address] [place] [phone]    Self-Help/Support Groups Organization         Address  Phone             Notes  [place] [facility] [place] - variety of support groups  [place] Call for more information  Narcotics Anonymous (NA), [facility]aring [facility] [address], [employer] [place]  2 meetings at this location   Special educational needs teacher         Address  Phone  Notes  [facility]idential [facility] [place],    [place]  [phone]   [facility]  [address] [place] [phone], [place], [place]   [place], [place] [phone] Admissions: 8am-3pm M-F  Incentives Substance [place] [address],    [place], [place] [phone]   [facility] [address] Jadene Pierini [place], [place]   [facility] [address]  [place], [place]   Insight Programs - Intensive Outpatient [address] Kristeen [address], [place], [place]   [facility] [place].,  [place], [place] or [phone]   [facility] Services (RTS) [address], [place], [place] Accepts Medicaid  [facility] [place] [address],  [place] [phone] Substance Abuse/Addiction Treatment   [facility] Resources Organization  Address  Phone  Notes  [facility]  [phone]   Domenic Schwab, PhD [address] Arlis Porta [place], [place]   [phone] or [phone]   [address] [facility] [place], [place] [phone]   [address], [place], [place] [phone] Insurance/Medicaid/sponsorship through [facility] and Families [address] [place]                                    [place], [place] [phone] [place] [address], [place] [phone]    Dr. Adele Schilder  [phone]   [facility] Dept. 1) [address]  2) [place] 3)  [place] [address], [place] [phone]  [phone]   [place] [phone] or [phone] (After Hours)

## 2013-02-06 NOTE — ED Notes (Signed)
Pt c/o productive cough, sore throat and congestion for past several days. Denies fevers. Breathing easily. States it hurts to cough and breathe

## 2013-02-07 NOTE — Progress Notes (Signed)
CM received incoming phone message from Laurie Wade- that patient had called requesting the MD call her in another medication as the Chlorpheniramine-Hydrocodone  Is not covered by her MEDICAID plan.Change correction request completed and placed in FT for PA review.No further CM needs.

## 2013-02-07 NOTE — Progress Notes (Signed)
CM spoke  with Coral CeoJessica Palmer PA re patients inability to pay for her Chlorpheniramine -Hydrocodene.( As its not on the MEDICAID List) PA reviewed the case in EPIC  Per PA order this CM called patient she can purchase Robitussin OTC cough syrup.No patient questions.

## 2013-02-08 LAB — CULTURE, GROUP A STREP

## 2013-02-14 NOTE — ED Provider Notes (Signed)
Medical screening examination/treatment/procedure(s) were performed by non-physician practitioner and as supervising physician I was immediately available for consultation/collaboration.  EKG Interpretation    Date/Time:  Saturday February 06 2013 10:39:25 EST Ventricular Rate:  91 PR Interval:  162 QRS Duration: 174 QT Interval:  436 QTC Calculation: 536 R Axis:   140 Text Interpretation:  Normal sinus rhythm with sinus arrhythmia Biatrial enlargement Left bundle branch block No significant change since last tracing Confirmed by Denton LankSTEINL  MD, Alden Feagan ([id number]) on 02/06/2013 10:47:58 AM              Suzi RootsKevin E Marcello Tuzzolino, MD 02/14/13 [phone]

## 2013-02-18 ENCOUNTER — Ambulatory Visit (HOSPITAL_COMMUNITY)
Admission: RE | Admit: 2013-02-18 | Discharge: 2013-02-18 | Disposition: A | Discharge status: Home / Self Care | Payer: Medicaid Other | Source: Ambulatory Visit | Attending: Internal Medicine | Admitting: Internal Medicine

## 2013-02-18 ENCOUNTER — Encounter (HOSPITAL_COMMUNITY): Payer: Self-pay

## 2013-02-18 VITALS — BP 172/108 | HR 87 | Resp 18 | Wt 197.5 lb

## 2013-02-18 DIAGNOSIS — Z91199 Patient's noncompliance with other medical treatment and regimen due to unspecified reason: Secondary | ICD-10-CM | POA: Insufficient documentation

## 2013-02-18 DIAGNOSIS — Z9119 Patient's noncompliance with other medical treatment and regimen: Secondary | ICD-10-CM | POA: Insufficient documentation

## 2013-02-18 DIAGNOSIS — I509 Heart failure, unspecified: Secondary | ICD-10-CM | POA: Insufficient documentation

## 2013-02-18 DIAGNOSIS — I10 Essential (primary) hypertension: Secondary | ICD-10-CM | POA: Insufficient documentation

## 2013-02-18 DIAGNOSIS — I5022 Chronic systolic (congestive) heart failure: Secondary | ICD-10-CM | POA: Insufficient documentation

## 2013-02-18 LAB — PRO B NATRIURETIC PEPTIDE: Pro B Natriuretic peptide (BNP): 2965 pg/mL — ABNORMAL HIGH (ref 0–125)

## 2013-02-18 LAB — BASIC METABOLIC PANEL
BUN: 9 mg/dL (ref 6–23)
CHLORIDE: 108 meq/L (ref 96–112)
CO2: 24 mEq/L (ref 19–32)
Calcium: 9 mg/dL (ref 8.4–10.5)
Creatinine, Ser: 0.71 mg/dL (ref 0.50–1.10)
GFR calc non Af Amer: >90 mL/min (ref >90)
GLUCOSE: 108 mg/dL — AB (ref 70–99)
POTASSIUM: 4.3 meq/L (ref 3.7–5.3)
Sodium: 144 mEq/L (ref 137–147)

## 2013-02-18 MED ORDER — LISINOPRIL 20 MG PO TABS: 20.0000 mg | ORAL_TABLET | Freq: Every day | ORAL | Status: DC

## 2013-02-18 MED ORDER — FUROSEMIDE 40 MG PO TABS: 40.0000 mg | ORAL_TABLET | Freq: Every day | ORAL | Status: DC

## 2013-02-18 NOTE — Patient Instructions (Addendum)
Take lasix 80 mg (2 tablets) daily for 2 days and then take 40 mg (1 tablet) daily.  Start taking lisinopril 20 mg daily.  F/U 7-10 days with labs.  Do the following things EVERYDAY: 1) Weigh yourself in the morning before breakfast. Write it down and keep it in a log. 2) Take your medicines as prescribed 3) Eat low salt foods-Limit salt (sodium) to 2000 mg per day.  4) Stay as active as you can everyday       5)   Limit all fluids for the day to less than 2 liters

## 2013-02-18 NOTE — Progress Notes (Signed)
Patient ID: Laurie Wade, female   DOB: 11/08/1980, 33 y.o.   MRN: [id number]  HPI: 33 year old female with a history of HTN, chronic systolic HF and noncompliance. She has had tubal ligation in the past.   ECHO EF 15% (05/2011) ECHO EF 35-40% (07/2012)  06/11/11 LHC/RHC Normal Coronaries.  RA = 18  RV = 54/11 (19)  PA = 54/28 (37)  PCW = 26  Fick cardiac output/index = 7.5/3.9  Thermo CO/CI = 6.5/3.4  PVR = 1.5 Woods  SVR =  FA sat = 97%  PA sat = 64%, 69%  SVC sat = 73%   Admitted 07/2012 for A/C HF. BP on arrival was 220/115 and she had been out of medications. She was discharged on lisinopril 10 mg daily, carvedilol 12.5 mg BID and lasix 40 mg BID. Discharge weight 209 pounds.   Follow up:  Has missed multiple appointments and been seen in the ED 6 times in past 6 months. Was seen in January and received IV lasix and was discharged with f/u the next day in clinic which she cancelled. +orthopnea and DOE. Denies CP, edema or palpitations. Weight at home 197 lbs. Has been out of lisinopril and lasix for a couple of weeks. Works PT at [employer].   ROS: All systems negative except as listed in HPI, PMH and Problem List.  Past Medical History  Diagnosis Date  . Hypertension   . Chronic systolic heart failure 05/2011    a. NICM b. 5/13 LHC: nl cors c. 7/14 ECHO: EF 35-40% diff HK, MV calcified annulus, LA mild dil, MV triv regur, TV triv regur    Current Outpatient Prescriptions  Medication Sig Dispense Refill  . carvedilol (COREG) 12.5 MG tablet Take 12.5 mg by mouth 2 (two) times daily with a meal.       . chlorpheniramine-HYDROcodone (TUSSIONEX PENNKINETIC ER) 10-8 MG/5ML LQCR Take 5 mLs by mouth every 12 (twelve) hours as needed for cough (Cough).  115 mL  0  . furosemide (LASIX) 40 MG tablet Take 1 tablet (40 mg total) by mouth daily.  30 tablet  0  . ibuprofen (ADVIL,MOTRIN) 200 MG tablet Take 600 mg by mouth every 6 (six) hours as needed for pain.      . potassium chloride SA  (K-DUR,KLOR-CON) 20 MEQ tablet Take 20 mEq by mouth daily.       No current facility-administered medications for this encounter.    Filed Vitals:   02/18/13 1333  BP: 172/108  Pulse: 87  Resp: 18  Weight: 197 lb 8 oz (89.585 kg)  SpO2: 98%    PHYSICAL EXAM: General:  Fatigued appearing. No resp difficulty HEENT: normal Neck: supple. JVP 8-9 Carotids 2+ bilaterally; no bruits. No lymphadenopathy or thryomegaly appreciated. Cor: PMI normal. Regular rate & rhythm. No rubs,  Murmurs. S3 Lungs: clear Abdomen: soft, nontender, nondistended. No hepatosplenomegaly. No bruits or masses. Good bowel sounds. Extremities: no cyanosis, clubbing, rash, 1+ edema Neuro: alert & orientedx3, cranial nerves grossly intact. Moves all 4 extremities w/o difficulty. Affect pleasant.  ASSESSMENT & PLAN:  1) Chronic systolic HF: NICM likely r/t HTN, EF 35-40% (07/2012) - NYHA II-III symptoms and volume status elevated. She has been non-compliant and out of medications (lasix and lisinopril for a couple of weeks). She has missed multiple appointments as well. Will restart lasix 40 mg daily, however have asked her to take 80 mg for next two days. Check BMET today. - Restart lisinopril 20 mg daily, will need  BMET in 7-10 days. Lengthy discussion with patient about compliance and the need to follow up. Will send rx with no refills d/t non-compliance. She reports she will get labs checked and that she will call tomorrow for a date to F/u needs to look at work schedule. - Continue coreg 12.5 mg BID. - Reinforced the need and importance of daily weights, a low sodium diet, and fluid restriction (less than 2 L a day). Instructed to call the HF clinic if weight increases more than 3 lbs overnight or 5 lbs in a week.  - Provided her with medication bag and pill box. - Need to repeat ECHO. - F/U 7-10 days with BMET 2) HTN - SBP elevated. As above she has been non-compliant and ran out of meds. Restart lisinopril 20 mg  daily and recheck BMET 7-10 days. 3) Non-compliance - Have reinterated the need to follow up and the importance.   Ulla Potash B NP-C 1:58 PM

## 2013-02-19 MED ORDER — FUROSEMIDE 40 MG PO TABS: 40.0000 mg | ORAL_TABLET | Freq: Every day | ORAL | Status: DC

## 2013-02-19 NOTE — Addendum Note (Signed)
Encounter addended by: Noralee SpaceHeather M Baptiste Littler, RN on: 02/19/2013  9:24 AM<BR>     Documentation filed: Orders

## 2013-03-04 NOTE — Addendum Note (Signed)
Encounter addended by: Aundria RudAli B Nastassja Witkop, NP on: 03/04/2013 10:10 PM<BR>     Documentation filed: Follow-up Section, LOS Section

## 2013-03-10 ENCOUNTER — Other Ambulatory Visit (HOSPITAL_COMMUNITY): Payer: Self-pay | Admitting: *Deleted

## 2013-03-10 MED ORDER — CARVEDILOL 12.5 MG PO TABS: 12.5000 mg | ORAL_TABLET | Freq: Two times a day (BID) | ORAL | Status: DC

## 2013-03-15 ENCOUNTER — Other Ambulatory Visit (HOSPITAL_COMMUNITY): Payer: Self-pay

## 2013-03-15 MED ORDER — CARVEDILOL 12.5 MG PO TABS: 12.5000 mg | ORAL_TABLET | Freq: Two times a day (BID) | ORAL | Status: DC

## 2013-04-01 ENCOUNTER — Emergency Department (HOSPITAL_COMMUNITY)
Admission: EM | Admit: 2013-04-01 | Discharge: 2013-04-01 | Disposition: A | Discharge status: Home / Self Care | Payer: Medicaid Other | Attending: Emergency Medicine | Admitting: Emergency Medicine

## 2013-04-01 ENCOUNTER — Encounter (HOSPITAL_COMMUNITY): Payer: Self-pay | Admitting: Emergency Medicine

## 2013-04-01 DIAGNOSIS — Z79899 Other long term (current) drug therapy: Secondary | ICD-10-CM | POA: Insufficient documentation

## 2013-04-01 DIAGNOSIS — I5022 Chronic systolic (congestive) heart failure: Secondary | ICD-10-CM | POA: Insufficient documentation

## 2013-04-01 DIAGNOSIS — M79609 Pain in unspecified limb: Secondary | ICD-10-CM | POA: Insufficient documentation

## 2013-04-01 DIAGNOSIS — I10 Essential (primary) hypertension: Secondary | ICD-10-CM | POA: Insufficient documentation

## 2013-04-01 DIAGNOSIS — M5430 Sciatica, unspecified side: Secondary | ICD-10-CM | POA: Insufficient documentation

## 2013-04-01 DIAGNOSIS — Z87891 Personal history of nicotine dependence: Secondary | ICD-10-CM | POA: Insufficient documentation

## 2013-04-01 MED ORDER — PREDNISONE 20 MG PO TABS: 40.0000 mg | ORAL_TABLET | Freq: Every day | ORAL | Status: DC

## 2013-04-01 MED ORDER — OXYCODONE-ACETAMINOPHEN 5-325 MG PO TABS: 1.0000 | ORAL_TABLET | ORAL | Status: DC | PRN

## 2013-04-01 NOTE — Discharge Instructions (Signed)
Take the prescribed medication as directed. °Follow-up with your primary care physician. °Return to the ED for new or worsening symptoms. ° °

## 2013-04-01 NOTE — ED Notes (Signed)
Hx of R lower back pain that radiates laterally down R leg. PT states it has been a long time since she's had this pain, but began again yesterday.  Pt reports 8/10 pain at present

## 2013-04-01 NOTE — ED Notes (Signed)
Pt reports back pain radiating to right leg; had episode of this last year; was treated here and has remained pain free since.

## 2013-04-01 NOTE — ED Provider Notes (Signed)
CSN: [id number]     Arrival date & time 04/01/13  1420 History  This chart was scribed for non-physician practitioner, Sharilyn SitesLisa Sanders, PA-C, working with Merrie RoofJohn David Wofford III, * by Smiley HousemanFallon Davis, ED Scribe. This patient was seen in room TR10C/TR10C and the patient's care was started at 4:12 PM.    Chief Complaint  Patient presents with  . Back Pain  . Leg Pain   The history is provided by the patient. No language interpreter was used.   HPI Comments: Laurie Wade is a 33 y.o. female who presents to the Emergency Department complaining of constant moderate lower back pain that radiates down her right leg that started yesterday.  Pt denies any injuries, falls, or heavy lifting.  She states pain is worse with palpation.  She has had similar pain, but states she hasn't had this pain in a while.  Pt has tried ibuprofen without relief.  Denies numbness or paresthesias of LE.  No loss of bowel or bladder control. No prior back surgeries.   Past Medical History  Diagnosis Date  . Hypertension   . Chronic systolic heart failure 05/2011    a. NICM b. 5/13 LHC: nl cors c. 7/14 ECHO: EF 35-40% diff HK, MV calcified annulus, LA mild dil, MV triv regur, TV triv regur   Past Surgical History  Procedure Laterality Date  . Cesarean section    . Cholecystectomy    . Tubal ligation     Family History  Problem Relation Age of Onset  . Heart failure Mother   . Hypertension Mother    History  Substance Use Topics  . Smoking status: Former Games developermoker  . Smokeless tobacco: Former NeurosurgeonUser    Quit date: 06/06/2006  . Alcohol Use: No   OB History   Grav Para Term Preterm Abortions TAB SAB Ect Mult Living                 Review of Systems  Constitutional: Negative for fever and chills.  Gastrointestinal: Negative for nausea, vomiting, abdominal pain and diarrhea.  Musculoskeletal: Positive for back pain. Negative for neck pain and neck stiffness.  Skin: Negative for color change and rash.   Psychiatric/Behavioral: Negative for behavioral problems and confusion.  All other systems reviewed and are negative.      Allergies  Doxycycline  Home Medications   Current Outpatient Rx  Name  Route  Sig  Dispense  Refill  . carvedilol (COREG) 12.5 MG tablet   Oral   Take 1 tablet (12.5 mg total) by mouth 2 (two) times daily with a meal. MUST HAVE FOLLOW UP APPOINTMENT FOR FURTHER REFILLS   60 tablet   0     MUST HAVE FOLLOW UP APPOINTMENT FOR FURTHER REFILL .Marland Kitchen..   . furosemide (LASIX) 40 MG tablet   Oral   Take 1 tablet (40 mg total) by mouth daily.   34 tablet   0     Needs to take 80 mg for 2 days and then 40 mg dail ...   . ibuprofen (ADVIL,MOTRIN) 200 MG tablet   Oral   Take 600 mg by mouth every 6 (six) hours as needed for pain.         Marland Kitchen. lisinopril (PRINIVIL,ZESTRIL) 20 MG tablet   Oral   Take 1 tablet (20 mg total) by mouth daily.   30 tablet   0    Triage Vitals: BP 142/95  Pulse 90  Temp(Src) 97.9 F (36.6 C) (Oral)  Resp 16  Ht 5\' 5"  (1.651 m)  Wt 195 lb 8 oz (88.678 kg)  BMI 32.53 kg/m2  SpO2 96%  LMP 03/18/2013  Physical Exam  Nursing note and vitals reviewed. Constitutional: She is oriented to person, place, and time. She appears well-developed and well-nourished. No distress.  HENT:  Head: Normocephalic and atraumatic.  Eyes: Conjunctivae and EOM are normal. Right eye exhibits no discharge. Left eye exhibits no discharge.  Neck: Neck supple. No tracheal deviation present.  Cardiovascular: Normal rate.   Pulmonary/Chest: Effort normal. No respiratory distress. She has no wheezes.  Musculoskeletal: Normal range of motion. She exhibits tenderness. She exhibits no edema.       Lumbar back: She exhibits tenderness, bony tenderness and pain.       Back:  Tenderness over right SI joint.  No gross deformity or midline step off.  Full ROM of back.  Distal sensation intact.  Ambulating unassisted without difficulty.  Neurological: She is  alert and oriented to person, place, and time.  Skin: Skin is warm and dry. No rash noted.  Psychiatric: She has a normal mood and affect. Her behavior is normal.    ED Course  Procedures (including critical care time) DIAGNOSTIC STUDIES: Oxygen Saturation is 96% on RA, adequate by my interpretation.    COORDINATION OF CARE: 4:15 PM-Will discharge with percocet and prednisone.  Patient informed of current plan of treatment and evaluation and agrees with plan.     MDM   Final diagnoses:  Sciatica   Sx and PE findings consistent with sciatic nerve pain.  No focal neuro deficits or signs/sx concerning for cauda equina.  Pt will be discharged with percocet and prednisone.  FU with PCP.  Discussed plan with pt, they agreed.  Return precautions advised.  I personally performed the services described in this documentation, which was scribed in my presence. The recorded information has been reviewed and is accurate.  Garlon Hatchet, PA-C 04/01/13 1950

## 2013-04-04 NOTE — ED Provider Notes (Signed)
Medical screening examination/treatment/procedure(s) were performed by non-physician practitioner and as supervising physician I was immediately available for consultation/collaboration.   EKG Interpretation None        Hideko Esselman David Hindy Perrault III, MD 04/04/13 1259 

## 2013-05-10 IMAGING — CT CT ABD-PELV W/O CM
2 of 5 series · 16 of 46 positions shown, 18 images · non-contrast
Comparison: Abdominal ultrasound of 06/06/2011

CLINICAL DATA: Status post cardiac catheterization.  Rule out
retroperitoneal bleed.  Hypertension and asthma.

CT ABDOMEN AND PELVIS WITHOUT CONTRAST
TECHNIQUE: Multidetector CT imaging of the abdomen and pelvis was
performed following the standard protocol without intravenous
contrast.

[Series 2: abd/pelv w/o 5.0 b31f st · axial · non-contrast · 0.82mm/px · z∈[+862,+1312]mm · 13 of 100 slices shown, 15 images]
[im 5/100  soft-tissue]
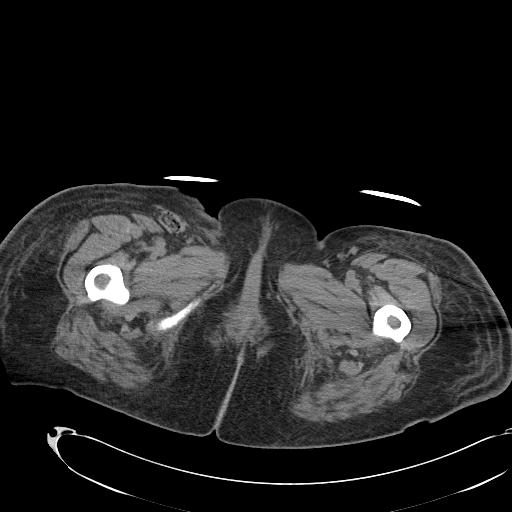
[im 5/100  bone]
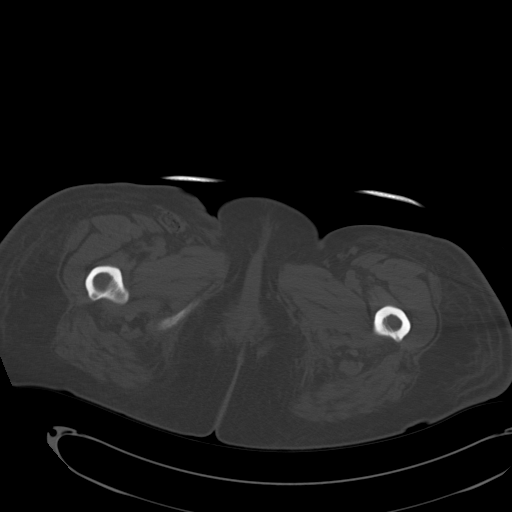
[im 14/100  soft-tissue]
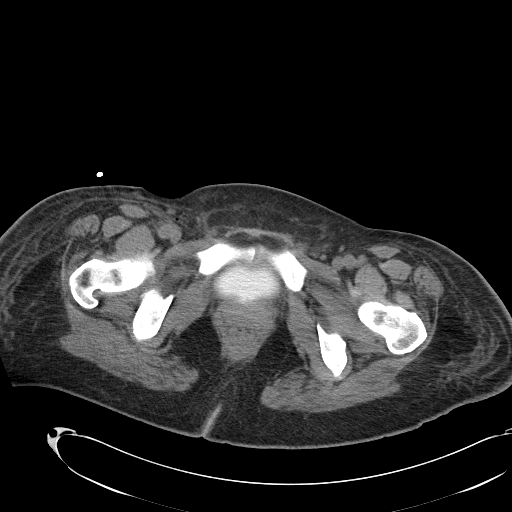
[im 23/100  soft-tissue]
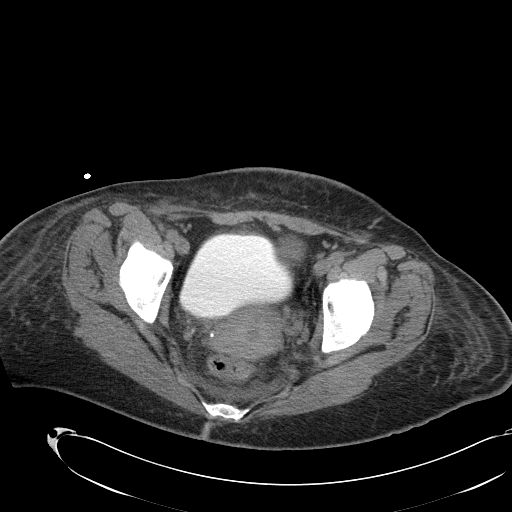
[im 28/100  soft-tissue]
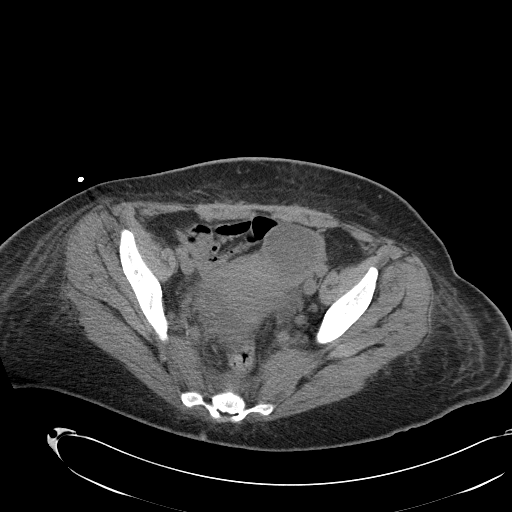
[im 37/100  soft-tissue]
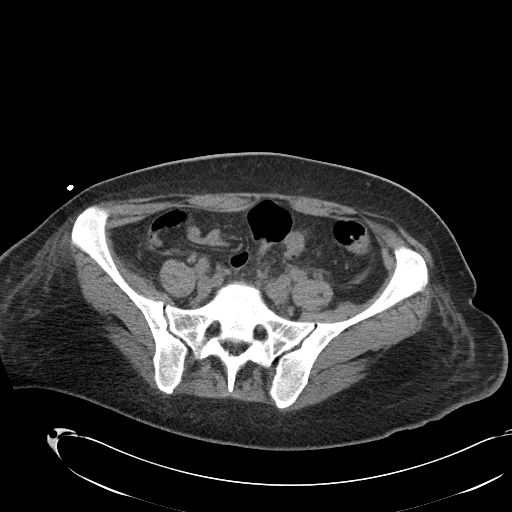
[im 41/100  soft-tissue]
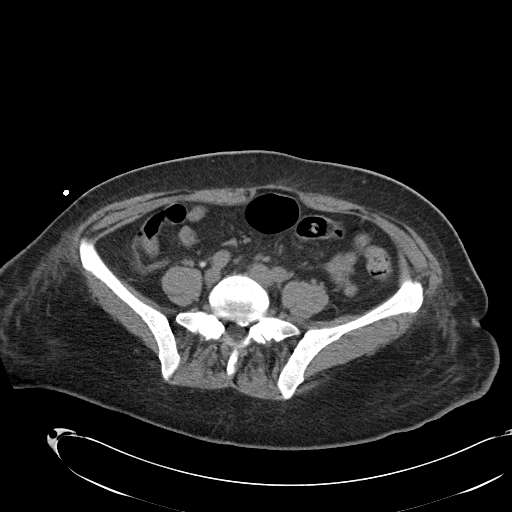
[im 50/100  soft-tissue]
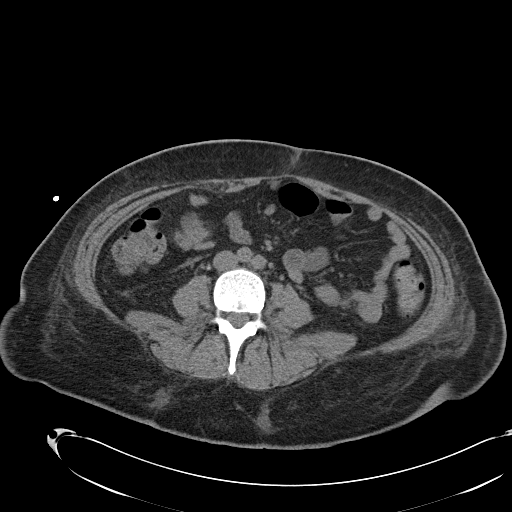
[im 59/100  soft-tissue]
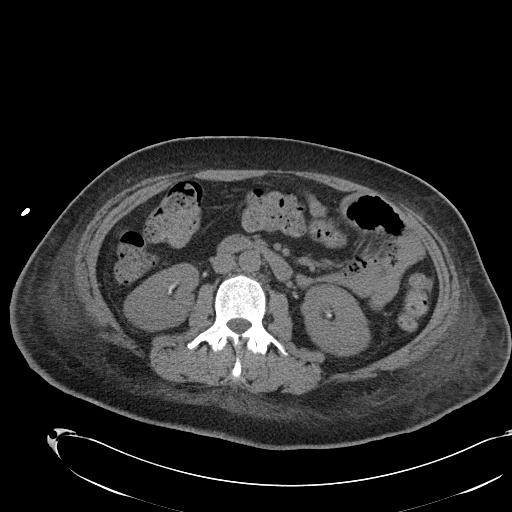
[im 64/100  soft-tissue]
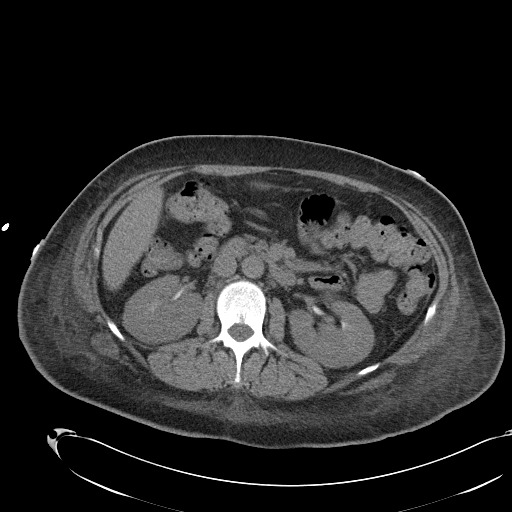
[im 64/100  bone]
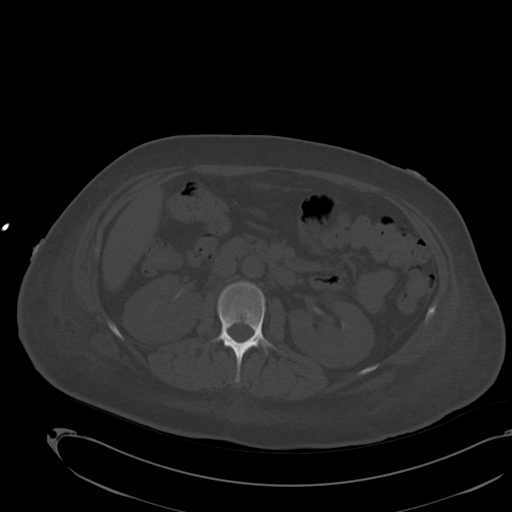
[im 73/100  soft-tissue]
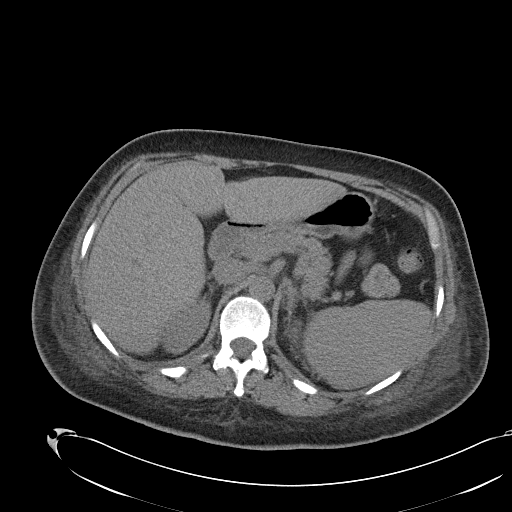
[im 77/100  soft-tissue]
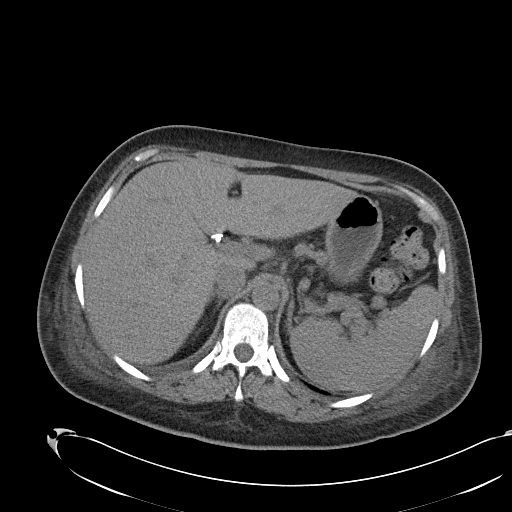
[im 86/100  soft-tissue]
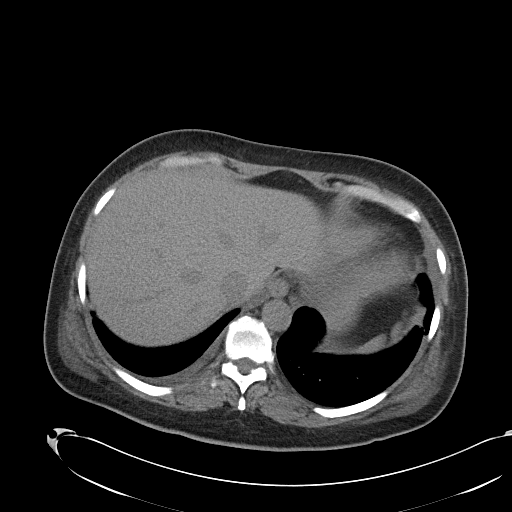
[im 95/100  soft-tissue]
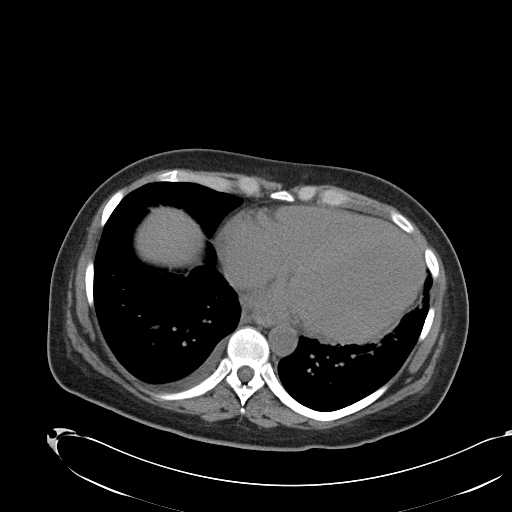

[Series 602: coronals · coronal · 1.04mm/px · 3 of 47 slices shown]
[im 16/47  soft-tissue]
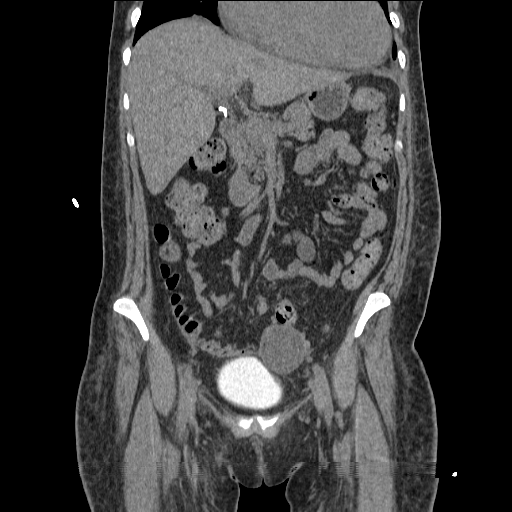
[im 21/47  soft-tissue]
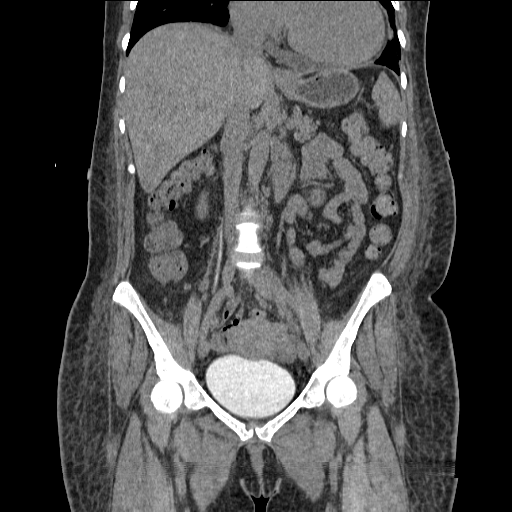
[im 26/47  soft-tissue]
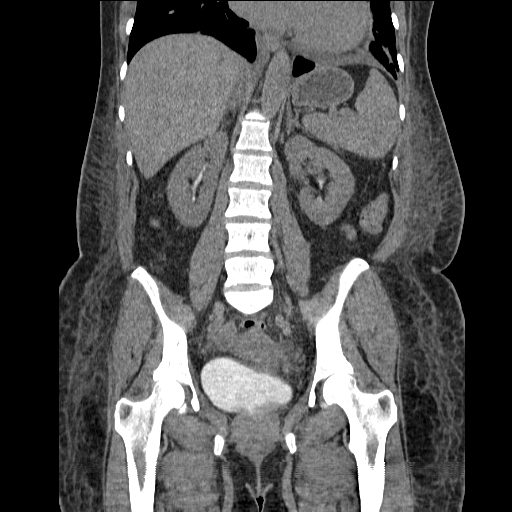

[16 of 46 positions shown; findings below may reference images not displayed]

FINDINGS: Bibasilar dependent subsegmental atelectasis.  Moderate
cardiomegaly with a small pericardial effusion.  This is similar.
Small right pleural effusion is decreased.

Normal uninfused appearance of the liver, spleen, stomach,
pancreas. Cholecystectomy without biliary ductal dilatation.
Normal adrenal glands.  Minimal contrast within the renal
collecting systems and ureters, likely related to prior
catheterization.

No retroperitoneal or retrocrural adenopathy.

Colonic stool burden suggests constipation.

Normal terminal ileum.  Appendix likely identified on image 63.  No
right lower quadrant inflammation.

Normal small bowel without abdominal ascites.

No pelvic adenopathy.  Expected edema and ill-defined subcutaneous
gas within the right groin.  No well-defined hematoma.  No pelvic
sidewall or retroperitoneal abdominal hematoma.

Contrast within urinary bladder.  A small amount of fluid within
the endocervix versus a Nabothian cyst.  Image 79.  4.5 cm low
density left adnexal/ovarian lesion on image 73 is likely a cyst.
No right adnexal mass. Trace free pelvic fluid is likely
physiologic.  No acute osseous abnormality.  Degenerative disc
disease at the lumbosacral junction.  Air about the anterior left
abdominal wall is likely iatrogenic; image 47.
IMPRESSION: 1.  No evidence of retroperitoneal or pelvic hematoma.  Expected
catheterization related edema and gas within the right groin.
2.  Decreased small right pleural effusion with similar small
pericardial effusion.
3. No acute process in the abdomen or pelvis.
4. Possible constipation.
5.  Left ovarian low density lesion is most likely a cyst.
Especially if there are symptoms of left-sided pelvic pain, further
evaluation with non emergent pelvic ultrasound should be
considered.

## 2013-06-13 ENCOUNTER — Emergency Department (HOSPITAL_COMMUNITY)
Admission: EM | Admit: 2013-06-13 | Discharge: 2013-06-13 | Disposition: A | Discharge status: Home / Self Care | Payer: Medicaid Other | Attending: Emergency Medicine | Admitting: Emergency Medicine

## 2013-06-13 ENCOUNTER — Encounter (HOSPITAL_COMMUNITY): Payer: Self-pay | Admitting: Emergency Medicine

## 2013-06-13 DIAGNOSIS — Z3202 Encounter for pregnancy test, result negative: Secondary | ICD-10-CM | POA: Insufficient documentation

## 2013-06-13 DIAGNOSIS — M79643 Pain in unspecified hand: Secondary | ICD-10-CM

## 2013-06-13 DIAGNOSIS — I10 Essential (primary) hypertension: Secondary | ICD-10-CM | POA: Insufficient documentation

## 2013-06-13 DIAGNOSIS — Z87891 Personal history of nicotine dependence: Secondary | ICD-10-CM | POA: Insufficient documentation

## 2013-06-13 DIAGNOSIS — I5022 Chronic systolic (congestive) heart failure: Secondary | ICD-10-CM | POA: Insufficient documentation

## 2013-06-13 DIAGNOSIS — Z79899 Other long term (current) drug therapy: Secondary | ICD-10-CM | POA: Insufficient documentation

## 2013-06-13 DIAGNOSIS — M79609 Pain in unspecified limb: Secondary | ICD-10-CM | POA: Insufficient documentation

## 2013-06-13 DIAGNOSIS — N898 Other specified noninflammatory disorders of vagina: Secondary | ICD-10-CM | POA: Insufficient documentation

## 2013-06-13 LAB — URINALYSIS, ROUTINE W REFLEX MICROSCOPIC
BILIRUBIN URINE: NEGATIVE
Glucose, UA: NEGATIVE mg/dL
Hgb urine dipstick: NEGATIVE
KETONES UR: NEGATIVE mg/dL
Leukocytes, UA: NEGATIVE
NITRITE: NEGATIVE
PH: 6.5 (ref 5.0–8.0)
Protein, ur: NEGATIVE mg/dL
Specific Gravity, Urine: 1.026 (ref 1.005–1.030)
Urobilinogen, UA: 1 mg/dL (ref 0.0–1.0)

## 2013-06-13 LAB — WET PREP, GENITAL
Clue Cells Wet Prep HPF POC: NONE SEEN
TRICH WET PREP: NONE SEEN
Yeast Wet Prep HPF POC: NONE SEEN

## 2013-06-13 LAB — POC URINE PREG, ED: Preg Test, Ur: NEGATIVE

## 2013-06-13 MED ORDER — KETOROLAC TROMETHAMINE 60 MG/2ML IM SOLN
60.0000 mg | Freq: Once | INTRAMUSCULAR | Status: AC
  Administered 2013-06-13: 60 mg via INTRAMUSCULAR
  Filled 2013-06-13: qty 2

## 2013-06-13 MED ORDER — PREDNISONE 10 MG PO TABS: 20.0000 mg | ORAL_TABLET | Freq: Every day | ORAL | Status: DC

## 2013-06-13 NOTE — Discharge Instructions (Signed)
Followup with the neurologist. Call to schedule an appointment

## 2013-06-13 NOTE — ED Notes (Signed)
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 Pt also c/o burning with urination and off white discharge.

## 2013-06-13 NOTE — ED Provider Notes (Signed)
CSN: [id number]     Arrival date & time 06/13/13  1521 History   First MD Initiated Contact with Patient 06/13/13 1542     Chief Complaint  Patient presents with  . Hand Pain    bilateral     (Consider location/radiation/quality/duration/timing/severity/associated sxs/prior Treatment) Patient is a 33 y.o. female presenting with hand pain. The history is provided by the patient.  Hand Pain   patient here complaining of bilateral hand pain x6 months. 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 She has used Motrin with temporary relief. Denies any change in her grip strength. Denies any pain with flexion of her wrists. Has not been seen by any physician for this. Denies any other neurological symptoms.  She has a secondary complaint of vaginal discharge x2 weeks. She's not been sexually active for several months. She says some dysuria without hematuria. No abdominal pain or fever. No vomiting noted. Symptoms are persistent and nothing makes them better. No treatment used prior to arrival.  Past Medical History  Diagnosis Date  . Hypertension   . Chronic systolic heart failure 05/2011    a. NICM b. 5/13 LHC: nl cors c. 7/14 ECHO: EF 35-40% diff HK, MV calcified annulus, LA mild dil, MV triv regur, TV triv regur   Past Surgical History  Procedure Laterality Date  . Cesarean section    . Cholecystectomy    . Tubal ligation     Family History  Problem Relation Age of Onset  . Heart failure Mother   . Hypertension Mother    History  Substance Use Topics  . Smoking status: Former Games developermoker  . Smokeless tobacco: Former NeurosurgeonUser    Quit date: 06/06/2006  . Alcohol Use: No   OB History   Grav Para Term Preterm Abortions TAB SAB Ect Mult Living                 Review of Systems  All other systems reviewed and are negative.     Allergies  Doxycycline  Home Medications   Prior to Admission  medications   Medication Sig Start Date End Date Taking? Authorizing Provider  carvedilol (COREG) 12.5 MG tablet Take 12.5 mg by mouth 2 (two) times daily with a meal.   Yes Historical Provider, MD  furosemide (LASIX) 40 MG tablet Take 1 tablet (40 mg total) by mouth daily. 02/19/13  Yes Aundria RudAli B Cosgrove, NP  ibuprofen (ADVIL,MOTRIN) 200 MG tablet Take 600 mg by mouth every 6 (six) hours as needed for pain.   Yes Historical Provider, MD  lisinopril (PRINIVIL,ZESTRIL) 20 MG tablet Take 20 mg by mouth daily.   Yes Historical Provider, MD   BP 162/97  Pulse 74  Temp(Src) 98.5 F (36.9 C) (Oral)  Resp 18  SpO2 100%  LMP 05/31/2013 Physical Exam  Nursing note and vitals reviewed. Constitutional: She is oriented to person, place, and time. She appears well-developed and well-nourished.  Non-toxic appearance. No distress.  HENT:  Head: Normocephalic and atraumatic.  Eyes: Conjunctivae, EOM and lids are normal. Pupils are equal, round, and reactive to light.  Neck: Normal range of motion. Neck supple. No tracheal deviation present. No mass present.  Cardiovascular: Normal rate, regular rhythm and normal heart sounds.  Exam reveals no gallop.   No murmur heard. Pulmonary/Chest: Effort normal and breath sounds normal. No stridor. No respiratory distress. She has no decreased breath sounds. She has  no wheezes. She has no rhonchi. She has no rales.  Abdominal: Soft. Normal appearance and bowel sounds are normal. She exhibits no distension. There is no tenderness. There is no rebound and no CVA tenderness.  Genitourinary: No bleeding around the vagina. Vaginal discharge found.  Musculoskeletal: Normal range of motion. She exhibits no edema and no tenderness.  Negative tinels sign, radial pulse 2+ bilateral. Neurovascular status intact  Neurological: She is alert and oriented to person, place, and time. She has normal strength. No cranial nerve deficit or sensory deficit. GCS eye subscore is 4. GCS verbal  subscore is 5. GCS motor subscore is 6.  Skin: Skin is warm and dry. No abrasion and no rash noted.  Psychiatric: She has a normal mood and affect. Her speech is normal and behavior is normal.    ED Course  Procedures (including critical care time) Labs Review Labs Reviewed  WET PREP, GENITAL  GC/CHLAMYDIA PROBE AMP  URINALYSIS, ROUTINE W REFLEX MICROSCOPIC  POC URINE PREG, ED    Imaging Review No results found.   EKG Interpretation None      MDM   Final diagnoses:  None    Wet prep negative. Urinalysis normal. Given Toradol for pain and feels better. We'll give neurological referral in place on course of prednisone.   Toy Baker, MD 06/13/13 [phone]

## 2013-06-14 LAB — GC/CHLAMYDIA PROBE AMP
CT PROBE, AMP APTIMA: NEGATIVE
GC PROBE AMP APTIMA: NEGATIVE

## 2013-07-01 ENCOUNTER — Emergency Department (HOSPITAL_COMMUNITY): Payer: Medicaid Other

## 2013-07-01 ENCOUNTER — Emergency Department (HOSPITAL_COMMUNITY)
Admission: EM | Admit: 2013-07-01 | Discharge: 2013-07-01 | Disposition: A | Discharge status: Home / Self Care | Payer: Medicaid Other | Attending: Emergency Medicine | Admitting: Emergency Medicine

## 2013-07-01 ENCOUNTER — Encounter (HOSPITAL_COMMUNITY): Payer: Self-pay | Admitting: Emergency Medicine

## 2013-07-01 DIAGNOSIS — I5022 Chronic systolic (congestive) heart failure: Secondary | ICD-10-CM | POA: Insufficient documentation

## 2013-07-01 DIAGNOSIS — R51 Headache: Secondary | ICD-10-CM | POA: Insufficient documentation

## 2013-07-01 DIAGNOSIS — Z3202 Encounter for pregnancy test, result negative: Secondary | ICD-10-CM | POA: Insufficient documentation

## 2013-07-01 DIAGNOSIS — R0602 Shortness of breath: Secondary | ICD-10-CM | POA: Insufficient documentation

## 2013-07-01 DIAGNOSIS — M791 Myalgia, unspecified site: Secondary | ICD-10-CM

## 2013-07-01 DIAGNOSIS — IMO0002 Reserved for concepts with insufficient information to code with codable children: Secondary | ICD-10-CM | POA: Insufficient documentation

## 2013-07-01 DIAGNOSIS — Z791 Long term (current) use of non-steroidal anti-inflammatories (NSAID): Secondary | ICD-10-CM | POA: Insufficient documentation

## 2013-07-01 DIAGNOSIS — IMO0001 Reserved for inherently not codable concepts without codable children: Secondary | ICD-10-CM | POA: Insufficient documentation

## 2013-07-01 DIAGNOSIS — Z87891 Personal history of nicotine dependence: Secondary | ICD-10-CM | POA: Insufficient documentation

## 2013-07-01 DIAGNOSIS — R110 Nausea: Secondary | ICD-10-CM | POA: Insufficient documentation

## 2013-07-01 DIAGNOSIS — R079 Chest pain, unspecified: Secondary | ICD-10-CM | POA: Insufficient documentation

## 2013-07-01 DIAGNOSIS — I10 Essential (primary) hypertension: Secondary | ICD-10-CM | POA: Insufficient documentation

## 2013-07-01 DIAGNOSIS — R519 Headache, unspecified: Secondary | ICD-10-CM

## 2013-07-01 LAB — CBC WITH DIFFERENTIAL/PLATELET
Basophils Absolute: 0 10*3/uL (ref 0.0–0.1)
Basophils Relative: 0 % (ref 0–1)
EOS PCT: 0 % (ref 0–5)
Eosinophils Absolute: 0 10*3/uL (ref 0.0–0.7)
HEMATOCRIT: 31.3 % — AB (ref 36.0–46.0)
Hemoglobin: 10.1 g/dL — ABNORMAL LOW (ref 12.0–15.0)
LYMPHS ABS: 0.9 10*3/uL (ref 0.7–4.0)
Lymphocytes Relative: 11 % — ABNORMAL LOW (ref 12–46)
MCH: 28.5 pg (ref 26.0–34.0)
MCHC: 32.3 g/dL (ref 30.0–36.0)
MCV: 88.4 fL (ref 78.0–100.0)
MONO ABS: 0.6 10*3/uL (ref 0.1–1.0)
MONOS PCT: 8 % (ref 3–12)
Neutro Abs: 6.2 10*3/uL (ref 1.7–7.7)
Neutrophils Relative %: 81 % — ABNORMAL HIGH (ref 43–77)
PLATELETS: 214 10*3/uL (ref 150–400)
RBC: 3.54 MIL/uL — AB (ref 3.87–5.11)
RDW: 15.4 % (ref 11.5–15.5)
WBC: 7.7 10*3/uL (ref 4.0–10.5)

## 2013-07-01 LAB — BASIC METABOLIC PANEL
BUN: 5 mg/dL — AB (ref 6–23)
CALCIUM: 9.1 mg/dL (ref 8.4–10.5)
CO2: 24 mEq/L (ref 19–32)
Chloride: 108 mEq/L (ref 96–112)
Creatinine, Ser: 0.6 mg/dL (ref 0.50–1.10)
GFR calc Af Amer: >90 mL/min (ref >90)
GFR calc non Af Amer: >90 mL/min (ref >90)
GLUCOSE: 110 mg/dL — AB (ref 70–99)
Potassium: 3.4 mEq/L — ABNORMAL LOW (ref 3.7–5.3)
SODIUM: 146 meq/L (ref 137–147)

## 2013-07-01 LAB — RAPID URINE DRUG SCREEN, HOSP PERFORMED
AMPHETAMINES: NOT DETECTED
BENZODIAZEPINES: NOT DETECTED
Barbiturates: NOT DETECTED
Cocaine: NOT DETECTED
Opiates: NOT DETECTED
Tetrahydrocannabinol: NOT DETECTED

## 2013-07-01 LAB — URINALYSIS, ROUTINE W REFLEX MICROSCOPIC
BILIRUBIN URINE: NEGATIVE
GLUCOSE, UA: NEGATIVE mg/dL
Hgb urine dipstick: NEGATIVE
Ketones, ur: NEGATIVE mg/dL
Leukocytes, UA: NEGATIVE
Nitrite: NEGATIVE
PH: 8 (ref 5.0–8.0)
PROTEIN: NEGATIVE mg/dL
Specific Gravity, Urine: 1.012 (ref 1.005–1.030)
Urobilinogen, UA: 1 mg/dL (ref 0.0–1.0)

## 2013-07-01 LAB — POC URINE PREG, ED: Preg Test, Ur: NEGATIVE

## 2013-07-01 LAB — I-STAT TROPONIN, ED: Troponin i, poc: 0.02 ng/mL (ref 0.00–0.08)

## 2013-07-01 LAB — PRO B NATRIURETIC PEPTIDE: Pro B Natriuretic peptide (BNP): 1808 pg/mL — ABNORMAL HIGH (ref 0–125)

## 2013-07-01 LAB — CK: Total CK: 70 U/L (ref 7–177)

## 2013-07-01 MED ORDER — OXYCODONE-ACETAMINOPHEN 5-325 MG PO TABS
1.0000 | ORAL_TABLET | Freq: Once | ORAL | Status: AC
  Administered 2013-07-01: 1 via ORAL
  Filled 2013-07-01: qty 1

## 2013-07-01 MED ORDER — OXYCODONE-ACETAMINOPHEN 5-325 MG PO TABS
1.0000 | ORAL_TABLET | Freq: Once | ORAL | Status: AC
Start: 2013-07-01 — End: 2013-07-01
  Administered 2013-07-01: 1 via ORAL
  Filled 2013-07-01: qty 1

## 2013-07-01 NOTE — ED Notes (Signed)
Per pt sts she is hurting all over since yesterday. sts hurts when she takes a deep breath. sts some nausea.

## 2013-07-01 NOTE — Discharge Instructions (Signed)

## 2013-07-01 NOTE — ED Provider Notes (Signed)
CSN: [id number]     Arrival date & time 07/01/13  0801 History   First MD Initiated Contact with Patient 07/01/13 [phone]     Chief Complaint  Patient presents with  . Generalized Body Aches     HPI Patient reports she has diffuse body aches since yesterday.  She reports after leaving work she felt very hot and became "achy" in arms/legs She reports she has is short of breath, diffuse muscles cramps and chest pain that is worse with deep breathing.  She also reports nausea.  She reports it is worsening.  Nothing improves her symptoms  No fever.  No significant cough.  No new LE edema.    Past Medical History  Diagnosis Date  . Hypertension   . Chronic systolic heart failure 05/2011    a. NICM b. 5/13 LHC: nl cors c. 7/14 ECHO: EF 35-40% diff HK, MV calcified annulus, LA mild dil, MV triv regur, TV triv regur   Past Surgical History  Procedure Laterality Date  . Cesarean section    . Cholecystectomy    . Tubal ligation     Family History  Problem Relation Age of Onset  . Heart failure Mother   . Hypertension Mother    History  Substance Use Topics  . Smoking status: Former Games developermoker  . Smokeless tobacco: Former NeurosurgeonUser    Quit date: 06/06/2006  . Alcohol Use: No   OB History   Grav Para Term Preterm Abortions TAB SAB Ect Mult Living                 Review of Systems  Constitutional: Positive for chills.  Respiratory: Positive for shortness of breath.   Cardiovascular: Positive for chest pain.  Gastrointestinal: Positive for nausea.  Musculoskeletal: Positive for back pain and myalgias.  Neurological: Positive for headaches.  All other systems reviewed and are negative.     Allergies  Doxycycline  Home Medications   Prior to Admission medications   Medication Sig Start Date End Date Taking? Authorizing Provider  carvedilol (COREG) 12.5 MG tablet Take 12.5 mg by mouth 2 (two) times daily with a meal.    Historical Provider, MD  furosemide (LASIX) 40 MG tablet Take  1 tablet (40 mg total) by mouth daily. 02/19/13   Aundria RudAli B Cosgrove, NP  ibuprofen (ADVIL,MOTRIN) 200 MG tablet Take 600 mg by mouth every 6 (six) hours as needed for pain.    Historical Provider, MD  lisinopril (PRINIVIL,ZESTRIL) 20 MG tablet Take 20 mg by mouth daily.    Historical Provider, MD  predniSONE (DELTASONE) 10 MG tablet Take 2 tablets (20 mg total) by mouth daily. 06/13/13   Toy BakerAnthony T Allen, MD   BP 163/95  Pulse 96  Temp(Src) 99 F (37.2 C)  Resp 18  SpO2 100%  LMP 07/01/2013 Physical Exam CONSTITUTIONAL: Well developed/well nourished HEAD: Normocephalic/atraumatic EYES: EOMI/PERRL ENMT: Mucous membranes moist NECK: supple no meningeal signs SPINE:entire spine nontender CV: S1/S2 noted LUNGS:coarse wheeze noted bilaterally, no apparent distress ABDOMEN: soft, nontender, no rebound or guarding GU:no cva tenderness NEURO: Pt is awake/alert, moves all extremitiesx4 EXTREMITIES: pulses normal, full ROM, no LE edema SKIN: warm, color normal PSYCH: no abnormalities of mood noted  ED Course  Procedures  8:34 AM Pt with multiple complaints including myalgias/cp/sob/fatigue Labs/imaging ordered Pt reports this occurred after heat exposure  Pt resting comfortably, no distress, well appearing,  Labs reassuring Doubt acute CHF Doubt ACS/PE at this time (pt with heaches/myalgias/atypical chest pain) Patient's main concern  appeared to be HA/myalias.  She reports it started after heat exposure yesterday Stable for d/c home Labs Review Labs Reviewed  CBC WITH DIFFERENTIAL - Abnormal; Notable for the following:    RBC 3.54 (*)    Hemoglobin 10.1 (*)    HCT 31.3 (*)    Neutrophils Relative % 81 (*)    Lymphocytes Relative 11 (*)    All other components within normal limits  BASIC METABOLIC PANEL - Abnormal; Notable for the following:    Potassium 3.4 (*)    Glucose, Bld 110 (*)    BUN 5 (*)    All other components within normal limits  CK  URINALYSIS, ROUTINE W REFLEX  MICROSCOPIC  URINE RAPID DRUG SCREEN (HOSP PERFORMED)  PRO B NATRIURETIC PEPTIDE  I-STAT TROPOININ, ED  POC URINE PREG, ED    Imaging Review Dg Chest Portable 1 View  07/01/2013   CLINICAL DATA:  Shortness of breath.  EXAM: PORTABLE CHEST - 1 VIEW  COMPARISON:  February 06, 2013.  FINDINGS: Stable mild cardiomegaly. No pleural effusion or pneumothorax is noted. Both lungs are clear. The visualized skeletal structures are unremarkable.  IMPRESSION: No acute cardiopulmonary abnormality seen.   Electronically Signed   By: Roque LiasJames  Green M.D.   On: 07/01/2013 08:56     EKG Interpretation   Date/Time:  Thursday July 01 2013 08:33:17 EDT Ventricular Rate:  90 PR Interval:  162 QRS Duration: 180 QT Interval:  438 QTC Calculation: 536 R Axis:     Text Interpretation:  Sinus rhythm LAE, consider biatrial enlargement Left  bundle branch block No significant change since last tracing Confirmed by  Bebe ShaggyWICKLINE  MD, Dorinda HillNALD ([id number]) on 07/01/2013 8:47:29 AM      MDM   Final diagnoses:  Myalgia  Nonintractable headache, unspecified chronicity pattern, unspecified headache type  Chest pain, unspecified chest pain type    Nursing notes including past medical history and social history reviewed and considered in documentation xrays reviewed and considered Labs/vital reviewed and considered     Joya Gaskinsonald W Wickline, MD 07/01/13 1142

## 2013-07-08 ENCOUNTER — Emergency Department (HOSPITAL_COMMUNITY)
Admission: EM | Admit: 2013-07-08 | Discharge: 2013-07-08 | Disposition: A | Discharge status: Home / Self Care | Payer: Medicaid Other | Attending: Emergency Medicine | Admitting: Emergency Medicine

## 2013-07-08 ENCOUNTER — Encounter (HOSPITAL_COMMUNITY): Payer: Self-pay | Admitting: Emergency Medicine

## 2013-07-08 DIAGNOSIS — I5022 Chronic systolic (congestive) heart failure: Secondary | ICD-10-CM | POA: Insufficient documentation

## 2013-07-08 DIAGNOSIS — Z87891 Personal history of nicotine dependence: Secondary | ICD-10-CM | POA: Insufficient documentation

## 2013-07-08 DIAGNOSIS — I10 Essential (primary) hypertension: Secondary | ICD-10-CM | POA: Insufficient documentation

## 2013-07-08 DIAGNOSIS — Z79899 Other long term (current) drug therapy: Secondary | ICD-10-CM | POA: Insufficient documentation

## 2013-07-08 DIAGNOSIS — S4991XA Unspecified injury of right shoulder and upper arm, initial encounter: Secondary | ICD-10-CM

## 2013-07-08 DIAGNOSIS — M25519 Pain in unspecified shoulder: Secondary | ICD-10-CM | POA: Insufficient documentation

## 2013-07-08 MED ORDER — NAPROXEN 500 MG PO TABS: 500.0000 mg | ORAL_TABLET | Freq: Two times a day (BID) | ORAL | Status: DC

## 2013-07-08 MED ORDER — TRAMADOL HCL 50 MG PO TABS
50.0000 mg | ORAL_TABLET | Freq: Four times a day (QID) | ORAL | Status: DC | PRN
Start: 2013-07-08 — End: 2013-09-03

## 2013-07-08 NOTE — ED Provider Notes (Signed)
CSN: [id number]     Arrival date & time 07/08/13  1651 History  This chart was scribed for Santiago GladHeather Rollyn Scialdone PA-C working with Harrold DonathNathan R. Rubin PayorPickering, MD by Ashley JacobsBrittany Andrews, ED scribe. This patient was seen in room WTR6/WTR6 and the patient's care was started at 5:22 PM.  First MD Initiated Contact with Patient 07/08/13 1656     Chief Complaint  Patient presents with  . Shoulder Pain    right  . Arm Pain    right     (Consider location/radiation/quality/duration/timing/severity/associated sxs/prior Treatment) Patient is a 33 y.o. female presenting with shoulder pain and arm pain. The history is provided by the patient and medical records. No language interpreter was used.  Shoulder Pain Pertinent negatives include no abdominal pain.  Arm Pain Pertinent negatives include no abdominal pain.   HPI Comments: Laurie Wade is a 33 y.o. female who presents to the Emergency Department complaining of constant, moderate, right shoulder for the past three days. She denies injury or trauma. The arm pain is worse while at work which she explains is a cold environment. Pt has intermittent numbness and tingling in her bilateral hands. She has tried Ibuprofen, Tylenol and Aleve without any improvements. Pt chops fruit at work everyday, which worsens the pain.  Denies abdominal pain, nausea, vomiting, chest pain, fever, or chills.    Past Medical History  Diagnosis Date  . Hypertension   . Chronic systolic heart failure 05/2011    a. NICM b. 5/13 LHC: nl cors c. 7/14 ECHO: EF 35-40% diff HK, MV calcified annulus, LA mild dil, MV triv regur, TV triv regur   Past Surgical History  Procedure Laterality Date  . Cesarean section    . Cholecystectomy    . Tubal ligation     Family History  Problem Relation Age of Onset  . Heart failure Mother   . Hypertension Mother    History  Substance Use Topics  . Smoking status: Former Games developermoker  . Smokeless tobacco: Former NeurosurgeonUser    Quit date: 06/06/2006  .  Alcohol Use: No   OB History   Grav Para Term Preterm Abortions TAB SAB Ect Mult Living                 Review of Systems  Gastrointestinal: Negative for nausea, vomiting and abdominal pain.  Musculoskeletal: Positive for arthralgias and myalgias.  Neurological: Positive for numbness.  All other systems reviewed and are negative.     Allergies  Doxycycline  Home Medications   Prior to Admission medications   Medication Sig Start Date End Date Taking? Authorizing Provider  carvedilol (COREG) 12.5 MG tablet Take 12.5 mg by mouth 2 (two) times daily with a meal.    Historical Provider, MD  diphenhydrAMINE (BENADRYL) 25 mg capsule Take 25-50 mg by mouth every 6 (six) hours as needed for allergies.    Historical Provider, MD  furosemide (LASIX) 40 MG tablet Take 1 tablet (40 mg total) by mouth daily. 02/19/13   Aundria RudAli B Cosgrove, NP  ibuprofen (ADVIL,MOTRIN) 200 MG tablet Take 400-600 mg by mouth every 6 (six) hours as needed for headache or mild pain.     Historical Provider, MD  lisinopril (PRINIVIL,ZESTRIL) 20 MG tablet Take 20 mg by mouth at bedtime.     Historical Provider, MD  spironolactone (ALDACTONE) 25 MG tablet Take 25 mg by mouth at bedtime.    Historical Provider, MD   BP 125/69  Pulse 76  Resp 18  SpO2 100%  LMP 07/01/2013 Physical Exam  Nursing note and vitals reviewed. Constitutional: She is oriented to person, place, and time. She appears well-developed and well-nourished.  HENT:  Head: Normocephalic.  Eyes: Pupils are equal, round, and reactive to light.  Neck: Normal range of motion.  Cardiovascular: Normal rate, regular rhythm and normal heart sounds.   Pulses:      Radial pulses are 2+ on the right side, and 2+ on the left side.  Pulmonary/Chest: Effort normal and breath sounds normal.  Musculoskeletal: Normal range of motion. She exhibits tenderness. She exhibits no edema.  No erythema, warmth or edema of right shoulder Full ROM of right shoulder Pain  with abduction, flexion and extension of the right shoulder.   Neurological: She is alert and oriented to person, place, and time.  Distal sensation of all fingers of the right hand intact  Skin: Skin is warm and dry. She is not diaphoretic. No erythema.    ED Course  Procedures (including critical care time) DIAGNOSTIC STUDIES: Oxygen Saturation is 100% on room air, normal by my interpretation.    COORDINATION OF CARE:  5:26 PM Discussed course of care with pt which includes pain medication and referral to an orthopedist . Pt understands and agrees.   Labs Review Labs Reviewed - No data to display  Imaging Review No results found.   EKG Interpretation None      MDM   Final diagnoses:  None   Patient presenting with right shoulder pain.  No signs of infection on exam.  No injury or trauma to the shoulder.  Patient did not want a sling.  Patient without abdominal pain, nausea, or vomiting.  No chest pain.  Feel that the pain is musculoskeletal.  Patient stable for discharge.  Patient given referral to Orthopedics.   Santiago GladHeather Amilah Greenspan, PA-C 07/08/13 2257

## 2013-07-08 NOTE — ED Notes (Signed)
Pt c/o right shoulder and right arm pain since Monday. Pt not for sure if she pulled, lifted or possibly hurt it will at work.

## 2013-07-08 NOTE — ED Provider Notes (Signed)
Medical screening examination/treatment/procedure(s) were performed by non-physician practitioner and as supervising physician I was immediately available for consultation/collaboration.   EKG Interpretation None       Juliet RudeNathan R. Rubin PayorPickering, MD 07/08/13 [phone]

## 2013-07-26 ENCOUNTER — Telehealth: Payer: Self-pay | Admitting: Physician Assistant

## 2013-07-26 MED ORDER — FUROSEMIDE 40 MG PO TABS: 40.0000 mg | ORAL_TABLET | Freq: Every day | ORAL | Status: DC

## 2013-07-26 NOTE — Telephone Encounter (Signed)
Patient ran out of Lasix and needed refil.  Kalecia Hartney PA-C

## 2013-09-03 ENCOUNTER — Emergency Department (HOSPITAL_COMMUNITY): Payer: Medicaid Other

## 2013-09-03 ENCOUNTER — Encounter (HOSPITAL_COMMUNITY): Payer: Self-pay | Admitting: Emergency Medicine

## 2013-09-03 ENCOUNTER — Emergency Department (HOSPITAL_COMMUNITY)
Admission: EM | Admit: 2013-09-03 | Discharge: 2013-09-03 | Disposition: A | Discharge status: Home / Self Care | Payer: Medicaid Other | Attending: Emergency Medicine | Admitting: Emergency Medicine

## 2013-09-03 DIAGNOSIS — Z91199 Patient's noncompliance with other medical treatment and regimen due to unspecified reason: Secondary | ICD-10-CM | POA: Insufficient documentation

## 2013-09-03 DIAGNOSIS — I5023 Acute on chronic systolic (congestive) heart failure: Secondary | ICD-10-CM | POA: Insufficient documentation

## 2013-09-03 DIAGNOSIS — R05 Cough: Secondary | ICD-10-CM | POA: Insufficient documentation

## 2013-09-03 DIAGNOSIS — Z3202 Encounter for pregnancy test, result negative: Secondary | ICD-10-CM | POA: Insufficient documentation

## 2013-09-03 DIAGNOSIS — R059 Cough, unspecified: Secondary | ICD-10-CM | POA: Insufficient documentation

## 2013-09-03 DIAGNOSIS — R0602 Shortness of breath: Secondary | ICD-10-CM | POA: Insufficient documentation

## 2013-09-03 DIAGNOSIS — R51 Headache: Secondary | ICD-10-CM | POA: Insufficient documentation

## 2013-09-03 DIAGNOSIS — R071 Chest pain on breathing: Secondary | ICD-10-CM | POA: Insufficient documentation

## 2013-09-03 DIAGNOSIS — R079 Chest pain, unspecified: Secondary | ICD-10-CM | POA: Insufficient documentation

## 2013-09-03 DIAGNOSIS — Z9119 Patient's noncompliance with other medical treatment and regimen: Secondary | ICD-10-CM | POA: Insufficient documentation

## 2013-09-03 DIAGNOSIS — R0789 Other chest pain: Secondary | ICD-10-CM

## 2013-09-03 DIAGNOSIS — Z79899 Other long term (current) drug therapy: Secondary | ICD-10-CM | POA: Insufficient documentation

## 2013-09-03 DIAGNOSIS — Z862 Personal history of diseases of the blood and blood-forming organs and certain disorders involving the immune mechanism: Secondary | ICD-10-CM | POA: Insufficient documentation

## 2013-09-03 DIAGNOSIS — I110 Hypertensive heart disease with heart failure: Secondary | ICD-10-CM | POA: Insufficient documentation

## 2013-09-03 DIAGNOSIS — Z87891 Personal history of nicotine dependence: Secondary | ICD-10-CM | POA: Insufficient documentation

## 2013-09-03 HISTORY — DX: Patient's noncompliance with other medical treatment and regimen: Z91.19

## 2013-09-03 HISTORY — DX: Patient's noncompliance with other medical treatment and regimen due to unspecified reason: Z91.199

## 2013-09-03 HISTORY — DX: Iron deficiency anemia, unspecified: D50.9

## 2013-09-03 HISTORY — DX: Unspecified systolic (congestive) heart failure: I50.20

## 2013-09-03 HISTORY — DX: Headache, unspecified: R51.9

## 2013-09-03 HISTORY — DX: Hypertensive heart disease with heart failure: I11.0

## 2013-09-03 HISTORY — DX: Acute on chronic systolic (congestive) heart failure: I50.23

## 2013-09-03 HISTORY — DX: Cardiomyopathy, unspecified: I42.9

## 2013-09-03 HISTORY — DX: Headache: R51

## 2013-09-03 HISTORY — DX: Left bundle-branch block, unspecified: I44.7

## 2013-09-03 HISTORY — DX: Anemia, unspecified: D64.9

## 2013-09-03 LAB — COMPREHENSIVE METABOLIC PANEL
ALBUMIN: 4 g/dL (ref 3.5–5.2)
ALT: 10 U/L (ref 0–35)
AST: 17 U/L (ref 0–37)
Alkaline Phosphatase: 58 U/L (ref 39–117)
Anion gap: 11 (ref 5–15)
BILIRUBIN TOTAL: 0.3 mg/dL (ref 0.3–1.2)
BUN: 17 mg/dL (ref 6–23)
CHLORIDE: 104 meq/L (ref 96–112)
CO2: 25 mEq/L (ref 19–32)
CREATININE: 0.8 mg/dL (ref 0.50–1.10)
Calcium: 9.2 mg/dL (ref 8.4–10.5)
GFR calc Af Amer: >90 mL/min (ref >90)
GFR calc non Af Amer: >90 mL/min (ref >90)
Glucose, Bld: 108 mg/dL — ABNORMAL HIGH (ref 70–99)
Potassium: 3.9 mEq/L (ref 3.7–5.3)
Sodium: 140 mEq/L (ref 137–147)
TOTAL PROTEIN: 7.3 g/dL (ref 6.0–8.3)

## 2013-09-03 LAB — CBC WITH DIFFERENTIAL/PLATELET
BASOS ABS: 0 10*3/uL (ref 0.0–0.1)
BASOS PCT: 0 % (ref 0–1)
EOS ABS: 0.1 10*3/uL (ref 0.0–0.7)
Eosinophils Relative: 1 % (ref 0–5)
HCT: 35.5 % — ABNORMAL LOW (ref 36.0–46.0)
HEMOGLOBIN: 11.5 g/dL — AB (ref 12.0–15.0)
Lymphocytes Relative: 23 % (ref 12–46)
Lymphs Abs: 1.6 10*3/uL (ref 0.7–4.0)
MCH: 28.5 pg (ref 26.0–34.0)
MCHC: 32.4 g/dL (ref 30.0–36.0)
MCV: 88.1 fL (ref 78.0–100.0)
MONO ABS: 0.4 10*3/uL (ref 0.1–1.0)
MONOS PCT: 6 % (ref 3–12)
NEUTROS ABS: 5 10*3/uL (ref 1.7–7.7)
NEUTROS PCT: 70 % (ref 43–77)
Platelets: 239 10*3/uL (ref 150–400)
RBC: 4.03 MIL/uL (ref 3.87–5.11)
RDW: 15.6 % — AB (ref 11.5–15.5)
WBC: 7.1 10*3/uL (ref 4.0–10.5)

## 2013-09-03 LAB — POC URINE PREG, ED: PREG TEST UR: NEGATIVE

## 2013-09-03 LAB — TROPONIN I

## 2013-09-03 LAB — D-DIMER, QUANTITATIVE (NOT AT ARMC)

## 2013-09-03 LAB — I-STAT TROPONIN, ED: TROPONIN I, POC: 0.01 ng/mL (ref 0.00–0.08)

## 2013-09-03 LAB — PRO B NATRIURETIC PEPTIDE: Pro B Natriuretic peptide (BNP): 106 pg/mL (ref 0–125)

## 2013-09-03 MED ORDER — OXYCODONE-ACETAMINOPHEN 5-325 MG PO TABS
1.0000 | ORAL_TABLET | Freq: Once | ORAL | Status: AC
  Administered 2013-09-03: 1 via ORAL
  Filled 2013-09-03: qty 1

## 2013-09-03 MED ORDER — ASPIRIN 325 MG PO TABS
325.0000 mg | ORAL_TABLET | Freq: Once | ORAL | Status: AC
  Administered 2013-09-03: 325 mg via ORAL
  Filled 2013-09-03: qty 1

## 2013-09-03 NOTE — ED Notes (Signed)
Phlebotomy at the bedside to get lab

## 2013-09-03 NOTE — ED Notes (Signed)
Dr. Rancour at the bedside.  

## 2013-09-03 NOTE — ED Provider Notes (Signed)
CSN: [id number]     Arrival date & time 09/03/13  4098 History   First MD Initiated Contact with Patient 09/03/13 0636     Chief Complaint  Patient presents with  . Chest Pain  . Shortness of Breath     (Consider location/radiation/quality/duration/timing/severity/associated sxs/prior Treatment) Patient is a 33 y.o. female presenting with chest pain and shortness of breath.  Chest Pain Associated symptoms: cough, headache and shortness of breath   Associated symptoms: no diaphoresis, no dizziness, no fever, no nausea, no numbness, not vomiting and no weakness   Shortness of Breath Associated symptoms: chest pain, cough and headaches   Associated symptoms: no diaphoresis, no fever and no vomiting   Laurie Wade is a 33 y/o F with PMHx of HTN, CHF presenting to the ED with chest pain and shortness of breath. 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 Stated that the pain is localized to the right side of the chest and center of the chest described as a sharp constant pain without radiation. Reported that the pain has been has consistent. Stated that she has been using Ibuprofen with minimal relief. Stated that she has been having intermittent shortness of breath, but this shortness of breath does occur when at rest, worse with activity. Reported that when she takes a deep inhalation the pain is worse. Denied use of oxygen therapy at home. Stated that she developed a headache this morning. Stated that her last LMP was a week and a half ago. Denied fever, diaphoresis, blurred vision, sudden loss of vision, neck pain, neck stiffness, nausea, vomiting, numbness, tingling, weakness. Denied history of smoking. PCP Dr. Mikeal Hawthorne  Past Medical History  Diagnosis Date  . Hypertension   . Chronic systolic heart failure 05/2011    a. NICM b. 5/13 LHC: nl cors c. 7/14 ECHO: EF 35-40% diff HK, MV calcified  annulus, LA mild dil, MV triv regur, TV triv regur  . Systolic congestive heart failure   . Anemia   . Left bundle branch block   . Cardiomyopathy   . Iron deficiency anemia   . 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 Headache   . Non-compliance   . Acute on chronic systolic heart failure    Past Surgical History  Procedure Laterality Date  . Cesarean section    . Cholecystectomy    . Tubal ligation     Family History  Problem Relation Age of Onset  . Heart failure Mother   . Hypertension Mother    History  Substance Use Topics  . Smoking status: Former Games developer  . Smokeless tobacco: Former Neurosurgeon    Quit date: 06/06/2006  . Alcohol Use: No   OB History   Grav Para Term Preterm Abortions TAB SAB Ect Mult Living                 Review of Systems  Constitutional: Negative for fever, chills and diaphoresis.  Respiratory: Positive for cough and shortness of breath. Negative for chest tightness.   Cardiovascular: Positive for chest pain. Negative for leg swelling.  Gastrointestinal: Negative for nausea and vomiting.  Neurological: Positive for headaches. Negative for dizziness, weakness and numbness.      Allergies  Doxycycline  Home Medications   Prior to Admission medications   Medication Sig Start Date End Date Taking? Authorizing Provider  carvedilol (COREG) 12.5 MG tablet Take  12.5 mg by mouth 2 (two) times daily with a meal.   Yes Historical Provider, MD  diphenhydrAMINE (BENADRYL) 25 mg capsule Take 25-50 mg by mouth every 6 (six) hours as needed for allergies.   Yes Historical Provider, MD  furosemide (LASIX) 40 MG tablet Take 1 tablet (40 mg total) by mouth daily. 07/26/13  Yes Wilburt Finlay, PA-C  ibuprofen (ADVIL,MOTRIN) 200 MG tablet Take 400-600 mg by mouth every 6 (six) hours as needed for headache or mild pain.    Yes Historical Provider, MD  lisinopril (PRINIVIL,ZESTRIL) 20 MG tablet Take 20 mg by mouth at bedtime.    Yes  Historical Provider, MD  spironolactone (ALDACTONE) 25 MG tablet Take 25 mg by mouth at bedtime.   Yes Historical Provider, MD   BP 152/98  Pulse 71  Temp(Src) 97.9 F (36.6 C) (Oral)  Resp 20  Ht  (1.676 m)  Wt 200 lb (90.719 kg)  BMI 32.30 kg/m2  SpO2 100% Physical Exam  Nursing note and vitals reviewed. Constitutional: She is oriented to person, place, and time. She appears well-developed and well-nourished. No distress.  Patient sitting upright in bed without any signs of respiratory distress  HENT:  Head: Normocephalic and atraumatic.  Mouth/Throat: Oropharynx is clear and moist. No oropharyngeal exudate.  Eyes: Conjunctivae and EOM are normal. Pupils are equal, round, and reactive to light. Right eye exhibits no discharge. Left eye exhibits no discharge.  Neck: Normal range of motion. Neck supple. No tracheal deviation present.  Negative neck stiffness Negative nuchal rigidity  Negative cervical lymphadenopathy  Negative meningeal signs   Cardiovascular: Normal rate, regular rhythm and normal heart sounds.   No murmur heard. Pulses:      Radial pulses are 2+ on the right side, and 2+ on the left side.       Dorsalis pedis pulses are 2+ on the right side, and 2+ on the left side.  Cap refill < 3 seconds Negative swelling or pitting edema noted to the lower extremities bilaterally   Pulmonary/Chest: Effort normal and breath sounds normal. No respiratory distress. She has no wheezes. She has no rales. She exhibits tenderness.    Patient is able to speak in full sentences without difficulty Negative use of accessory muscles Negative stridor Negative deformities identified to the chest wall Discomfort upon palpation to the right side of the chest-negative crepitus or depressions upon palpation - pain is reproducible upon palpation  Musculoskeletal: Normal range of motion. She exhibits no edema and no tenderness.  Full ROM to upper and lower extremities without  difficulty noted, negative ataxia noted.  Lymphadenopathy:    She has no cervical adenopathy.  Neurological: She is alert and oriented to person, place, and time. No cranial nerve deficit. She exhibits normal muscle tone. Coordination normal.  Cranial nerves III-XII grossly intact Strength 5+/5+ to upper and lower extremities bilaterally with resistance applied, equal distribution noted Equal grip strength bilaterally  Negative facial droop Negative slurred speech  Negative aphasia Negative arm drift Fine motor skills intact  Skin: Skin is warm and dry. No rash noted. She is not diaphoretic. No erythema.  Psychiatric: She has a normal mood and affect. Her behavior is normal. Thought content normal.    ED Course  Procedures (including critical care time)  7:15 AM Dr. Manus Gunning at bedside assessing patient.   Results for orders placed during the hospital encounter of 09/03/13  CBC WITH DIFFERENTIAL      Result Value Ref Range   WBC  7.1  4.0 - 10.5 K/uL   RBC 4.03  3.87 - 5.11 MIL/uL   Hemoglobin 11.5 (*) 12.0 - 15.0 g/dL   HCT 81.1 (*) 91.4 - 78.2 %   MCV 88.1  78.0 - 100.0 fL   MCH 28.5  26.0 - 34.0 pg   MCHC 32.4  30.0 - 36.0 g/dL   RDW 95.6 (*) 21.3 - 08.6 %   Platelets 239  150 - 400 K/uL   Neutrophils Relative % 70  43 - 77 %   Neutro Abs 5.0  1.7 - 7.7 K/uL   Lymphocytes Relative 23  12 - 46 %   Lymphs Abs 1.6  0.7 - 4.0 K/uL   Monocytes Relative 6  3 - 12 %   Monocytes Absolute 0.4  0.1 - 1.0 K/uL   Eosinophils Relative 1  0 - 5 %   Eosinophils Absolute 0.1  0.0 - 0.7 K/uL   Basophils Relative 0  0 - 1 %   Basophils Absolute 0.0  0.0 - 0.1 K/uL  COMPREHENSIVE METABOLIC PANEL      Result Value Ref Range   Sodium 140  137 - 147 mEq/L   Potassium 3.9  3.7 - 5.3 mEq/L   Chloride 104  96 - 112 mEq/L   CO2 25  19 - 32 mEq/L   Glucose, Bld 108 (*) 70 - 99 mg/dL   BUN 17  6 - 23 mg/dL   Creatinine, Ser 5.78  0.50 - 1.10 mg/dL   Calcium 9.2  8.4 - 46.9 mg/dL   Total  Protein 7.3  6.0 - 8.3 g/dL   Albumin 4.0  3.5 - 5.2 g/dL   AST 17  0 - 37 U/L   ALT 10  0 - 35 U/L   Alkaline Phosphatase 58  39 - 117 U/L   Total Bilirubin 0.3  0.3 - 1.2 mg/dL   GFR calc non Af Amer >90  >90 mL/min   GFR calc Af Amer >90  >90 mL/min   Anion gap 11  5 - 15  TROPONIN I      Result Value Ref Range   Troponin I <0.30  <0.30 ng/mL  PRO B NATRIURETIC PEPTIDE      Result Value Ref Range   Pro B Natriuretic peptide (BNP) 106.0  0 - 125 pg/mL  D-DIMER, QUANTITATIVE      Result Value Ref Range   D-Dimer, Quant <0.27  0.00 - 0.48 ug/mL-FEU  POC URINE PREG, ED      Result Value Ref Range   Preg Test, Ur NEGATIVE  NEGATIVE    Labs Review Labs Reviewed  CBC WITH DIFFERENTIAL - Abnormal; Notable for the following:    Hemoglobin 11.5 (*)    HCT 35.5 (*)    RDW 15.6 (*)    All other components within normal limits  COMPREHENSIVE METABOLIC PANEL - Abnormal; Notable for the following:    Glucose, Bld 108 (*)    All other components within normal limits  TROPONIN I  PRO B NATRIURETIC PEPTIDE  D-DIMER, QUANTITATIVE  POC URINE PREG, ED  POC URINE PREG, ED  Rosezena Sensor, ED    Imaging Review Dg Chest Port 1 View  09/03/2013   CLINICAL DATA:  Right chest pain.  Short of breath.  EXAM: PORTABLE CHEST - 1 VIEW  COMPARISON:  07/01/2013.  FINDINGS: Cardiac silhouette is top-normal in size. Normal mediastinal and hilar contours. Clear lungs. No pleural effusion or pneumothorax.  The bony thorax is  unremarkable.  IMPRESSION: No active disease.   Electronically Signed   By: Amie Portland M.D.   On: 09/03/2013 07:06     EKG Interpretation   Date/Time:  Friday September 03 2013 06:27:38 EDT Ventricular Rate:  86 PR Interval:  158 QRS Duration: 174 QT Interval:  452 QTC Calculation: 540 R Axis:   59 Text Interpretation:  Normal sinus rhythm Left bundle branch block  Abnormal ECG No significant change since last tracing Confirmed by Erroll Luna [phone]) on  09/03/2013 6:56:45 AM      MDM   Final diagnoses:  Chest wall pain    Medications  aspirin tablet 325 mg (325 mg Oral Given 09/03/13 0721)  oxyCODONE-acetaminophen (PERCOCET/ROXICET) 5-325 MG per tablet 1 tablet (1 tablet Oral Given 09/03/13 0923)   Filed Vitals:   09/03/13 0815 09/03/13 0820 09/03/13 0921 09/03/13 0952  BP: 147/81 132/89 137/81 152/98  Pulse: 79  89 71  Temp:      TempSrc:      Resp: Height:      Weight:      SpO2: 100%  100% 100%   This provider reviewed patient's chart. Patient had an echocardiogram performed on 08/03/2012 with an ejection fraction of 35-40%. Patient had cardiac catheterization performed on 06/10/2011 within normal coronaries, unremarkable findings. EKG identified normal sinus rhythm with left bundle branch block that appears to be old-heart rate 86 beats per minute. Troponin negative elevation. Second troponin negative elevation. BNP 106.0. D-dimer negative elevation. CBC unremarkable. CMP unremarkable. Urine pregnancy negative. Chest xray negative for acute cardiopulmonary disease.  Bilateral blood pressures unremarkable. Patient ambulated well with pulse ox in the 90s on room air without sign of respiratory distress-lowest was 93%. Troponin unremarkable with three-day history of chest pain that has been continuous-doubt ACS. Doubt PE-d-dimer negative elevation. Doubt aortic dissection. Doubt CHF exacerbation. Negative signs of respiratory distress. Negative signs of fluid overload. No new changes to EKG-bundle branch block noted from previous EKG. Patient appears comfortable. Patient stable, afebrile. Patient not septic appearing. Suspicion to be musculoskeletal secondary to pain being reproducible upon palpation and with motion. Patient seen and assessed by attending physician, Dr. Manus Gunning who agrees to plan of discharge. Discharged patient. Referred to primary care provider. Referred to cardiology. Discussed with patient to rest and stay  hydrated. Discussed with patient to continue to take medications as prescribed. Discussed with patient to massage and apply heat and stretch. Discussed with patient to closely monitor symptoms and if symptoms are to worsen or change to report back to the ED - strict return instructions given.  Patient agreed to plan of care, understood, all questions answered.   Raymon Mutton, PA-C 09/03/13 1658

## 2013-09-03 NOTE — ED Notes (Signed)
Pt reports she has been experiencing right sided chest pain increasing over last three days.  Tenderness upon palpation to right chest area.  Pain originally started one week ago and lasted for a short while, then went away.

## 2013-09-03 NOTE — Discharge Instructions (Signed)
Please call your doctor for a followup appointment within 24-48 hours. When you talk to your doctor please let them know that you were seen in the emergency department and have them acquire all of your records so that they can discuss the findings with you and formulate a treatment plan to fully care for your new and ongoing problems. Please rest and stay hydrated Please avoid any physical or strenuous activity Please apply icy hot ointment and massage-please apply heat next line please perform and participate slow circular motions to the right arm and stretch Please continue to monitor symptoms closely and if symptoms are to worsen or change (fever greater than 101, chills, sweating, nausea, vomiting, chest pain, shortness of breath, difficulty breathing, weakness, numbness, tingling, worsening or changes to pain pattern, fall, injury) please report back to the Emergency Department immediately.    Chest Wall Pain Chest wall pain is pain in or around the bones and muscles of your chest. It may take up to 6 weeks to get better. It may take longer if you must stay physically active in your work and activities.  CAUSES  Chest wall pain may happen on its own. However, it may be caused by:  A viral illness like the flu.  Injury.  Coughing.  Exercise.  Arthritis.  Fibromyalgia.  Shingles. HOME CARE INSTRUCTIONS   Avoid overtiring physical activity. Try not to strain or perform activities that cause pain. This includes any activities using your chest or your abdominal and side muscles, especially if heavy weights are used.  Put ice on the sore area.  Put ice in a plastic bag.  Place a towel between your skin and the bag.  Leave the ice on for 15-20 minutes per hour while awake for the first 2 days.  Only take over-the-counter or prescription medicines for pain, discomfort, or fever as directed by your caregiver. SEEK IMMEDIATE MEDICAL CARE IF:   Your pain increases, or you are very  uncomfortable.  You have a fever.  Your chest pain becomes worse.  You have new, unexplained symptoms.  You have nausea or vomiting.  You feel sweaty or lightheaded.  You have a cough with phlegm (sputum), or you cough up blood. MAKE SURE YOU:   Understand these instructions.  Will watch your condition.  Will get help right away if you are not doing well or get worse. Document Released: 12/24/2004 Document Revised: 03/18/2011 Document Reviewed: 08/20/2010 [facility] Patient Information 2015 [place], [place]. This information is not intended to replace advice given to you by your health care provider. Make sure you discuss any questions you have with your health care provider.   Emergency Department Resource Guide 1) Find a Doctor and Pay Out of Pocket Although you won't have to find out who is covered by your insurance plan, it is a good idea to ask around and get recommendations. You will then need to call the office and see if the doctor you have chosen will accept you as a new patient and what types of options they offer for patients who are self-pay. Some doctors offer discounts or will set up payment plans for their patients who do not have insurance, but you will need to ask so you aren't surprised when you get to your appointment.  2) Contact Your Local Health Department Not all health departments have doctors that can see patients for sick visits, but many do, so it is worth a call to see if yours does. If you don't know where  your local health department is, you can check in your phone book. The CDC also has a tool to help you locate your state's health department, and many state websites also have listings of all of their local health departments.  3) Find a Walk-in Clinic If your illness is not likely to be very severe or complicated, you may want to try a walk in clinic. These are popping up all over the country in pharmacies, drugstores, and shopping centers. They're usually  staffed by nurse practitioners or physician assistants that have been trained to treat common illnesses and complaints. They're usually fairly quick and inexpensive. However, if you have serious medical issues or chronic medical problems, these are probably not your best option.  No Primary Care Doctor: - Call Health Connect at  [phone] - they can help you locate a primary care doctor that  accepts your insurance, provides certain services, etc. - Physician Referral Service- [phone]  Chronic Pain Problems: Organization         Address  Phone   Notes  [facility]  [phone] Patients need to be referred by their primary care doctor.   Medication Assistance: Organization         Address  Phone   Notes  [facility] Medication [facility] [address] [place] [place], [place] [address] [phone] --Must be a resident of [facility] -- Must have NO insurance coverage whatsoever (no Medicaid/ Medicare, etc.) -- The pt. MUST have a primary care doctor that directs their care regularly and follows them in the community   [facility]  [phone]   [employer]  [phone]    Agencies that provide inexpensive medical care: Organization         Address  Phone   Notes  [facility]  [phone]   [facility]    [phone]   [facility] [address] [place], [place] [address] [phone]   [facility] [address] [place] [phone]   Planned Parenthood    [phone]   [facility]    [phone]   [facility]  [address], [place] Phone:  [phone], Fax:  [phone] Hours of Operation:  9 am - 6 pm, M-F.  Also accepts Medicaid/Medicare and self-pay.  [facility]  [address], [place] Phone: [phone], Fax: [phone]. Hours of  Operation:  8:30 am - 5:30 pm, M-F.  Also accepts Medicaid and self-pay.  [facility] [place] [address] [place] Phone: [phone]   [facility] [address] Natasha Bence [place], [place] [phone], Ext. [phone] Mondays & Thursdays: 7-9 AM.  First 15 patients are seen on a first come, first serve basis.    Medicaid-accepting [facility] Providers:  Organization         Address  Phone   Notes  [facility] [address], [place] [phone] Also accepts self-pay patients.  [facility] [address] [place], [place]  [phone]   [facility] [address], [place] [phone]   [facility] [address] [place] [phone]   Renaye Rakers [address], [place]   [phone] Only accepts [place] Access [place] patients  after they have their name applied to their card.   Self-Pay (no insurance) in [facility]:  Organization         Address  Phone   Notes  Sickle Cell Patients, [facility] [address] [place], [place] [phone]   [facility] [address] [place], [place] [phone]   [facility] [place]  [address], [place] [phone]   [facility]/Dr. Osei-Bonsu  [address], [place] or [address], [place] [phone] Phone number for both [place] and [place] locations is the same.  [facility] [address] [place] [phone]   [facility] [address], [place] or [address] [phone]   [facility] [address], [place] [phone], phone; [phone], fax Sees patients 1st and 3rd Saturday of every month.  Must not qualify for public or private insurance (i.e. Medicaid, Medicare,  [place] Health Choice, Veterans' Benefits)  Household income should be no more than 200% of the poverty level The clinic cannot treat you if you are pregnant or think you are pregnant  Sexually transmitted diseases are not treated at the clinic.    Dental Care: Organization         Address  Phone  Notes  [facility] of [facility] [address] [place], [place] [phone] Accepts children up to age 19 who are enrolled in [place] or [place] Health Choice; pregnant women with a Medicaid card; and children who have applied for Medicaid or [place] Health Choice, but were declined, whose parents can pay a reduced fee at time of service.  [facility] Department of [facility]  [address], [place] [phone] Accepts children up to age 43 who are enrolled in [place] or [place] Health Choice; pregnant women with a Medicaid card; and children who have applied for Medicaid or [place] Health Choice, but were declined, whose parents can pay a reduced fee at time of service.  [facility]  [address] [place], [place] [phone] Patients are seen by appointment only. Walk-ins are not accepted. [facility] will see patients 32 years of age and older. Monday - Tuesday (8am-5pm) Most Wednesdays (8:30-5pm) $30 per visit, cash only  [facility] Adult Dental Access [facility]  [address], [facility] [phone] Patients are seen by appointment only. Walk-ins are not accepted. [facility] will see patients 3 years of age and older. One Wednesday Evening (Monthly: Volunteer Based).  $30 per visit, cash only  [employer]  [phone] for adults; Children under age 64, call Graduate Pediatric Dentistry at [phone]. Children aged 27-14, please call [phone] to request a pediatric application.  Dental services are provided in all areas of dental care including fillings, crowns and bridges,  complete and partial dentures, implants, gum treatment, root canals, and extractions. Preventive care is also provided. Treatment is provided to both adults and children. Patients are selected via a lottery and there is often a waiting list.   [facility] [address] [place]  [phone] [email or web address]   [facility] [address] [place], [place] [phone], Ext. [phone] Second and Fourth Thursday of each month, opens at 6:30 AM; Clinic ends at 9 AM.  Patients  are seen on a first-come first-served basis, and a limited number are seen during each clinic.   [facility]  [address] Ether Griffins [place], [place] [phone]   Eligibility Requirements You must have lived in [place], [place], or [place] counties for at least the last three months.   You cannot be eligible for state or federal sponsored [place], including [employer], [place], or [employer].   You generally cannot be eligible for healthcare insurance through your employer.    How to apply: Eligibility screenings are held every Tuesday and Wednesday afternoon from 1:00 pm until 4:00 pm. You do not need an appointment for the interview!  [facility] [address] [place], [place] [phone]   [facility] Health [facility]  [phone]   [facility] Health Department  [phone]   [facility] Health Department  [phone]    Behavioral Health Resources in the Community: Intensive Outpatient Programs Organization         Address  Phone  Notes  [facility] [address], [place], [place] [phone]   [facility] [address] [place], [place] [phone]   [facility] [address] [place], [place]  [phone]   [facility] [address],  [place], [place] [phone] or [phone]   Substance Abuse Resources Organization          Address  Phone  Notes  [facility]  [phone]   [facility]  [phone]   [facility] [place]  [phone]   Floydene Flock  [phone]   Residential & Outpatient Substance Abuse Program  [phone]   Psychological Services Organization         Address  Phone  Notes  [facility]  [phone]   [facility]  [phone]   [facility] [address] [place] [phone] or [phone]    Mobile Crisis Teams Organization         Address  Phone  Notes  [facility], Mobile Crisis Care Unit  [phone]   [facility]  [address] [place], [place] [phone]   Doristine Locks [address] [place] [phone]    Self-Help/Support Groups Organization         Address  Phone             Notes  [facility] [place] - variety of support groups  336- I7437963 Call for more information  Narcotics Anonymous (NA), [facility] [address], [employer]   2 meetings at this location   Statistician         Address  Phone  Notes  [facility] [address],    [place]  [phone]   [facility]  [address], [place] [address], [place], [place] [phone]   [facility] [address] [place], [place] [phone] Admissions: 8am-3pm M-F  [facility] [address]    [place], [place] [phone]   [facility] [address] Starling Manns [place], [place] [phone]   [facility] [address]  [place], [place] [phone]   [facility] - Intensive Outpatient [address], [place], [place] [phone]   [facility] ([facility] [address] [place]  [place], [place] [phone] or [phone]) [phone]   [facility] ([facility]) [address],  [place], [place]  [phone] Accepts Medicaid  [facility] [place] [address],  [place] [phone] Substance Abuse/Addiction Treatment   [facility] Organization         Address  Phone  Notes  [facility]  [phone]   Angie Fava, PhD [address] Ervin Knack [place], [place]   [phone] or [phone]   [facility]al   [address] [place], [place] [phone]   [facility] [address], [place], [place] [phone] Insurance/Medicaid/sponsorship through [facility] and Families [address]                                    [place], [place] [phone] Therapy/tele-psych/case  [facility] [address] [place], [place] [phone]    Dr. Lolly Mustache  [phone]   [facility]  [employer] [facility] Dept. 1) [address], [place] 2) [address], [place] 3)  [address], [place] [phone]  [phone]   [facility] Child Abuse Hotline 702-599-2860 or [phone]) [phone] (After Hours)

## 2013-09-03 NOTE — ED Notes (Signed)
Pt amb to the bathroom for urine specimen 

## 2013-09-03 NOTE — ED Notes (Signed)
Pt here for cp, and sob, onset today, sts on right side of chest, pt denies hx of cardiac, ekg abn

## 2013-09-03 NOTE — ED Provider Notes (Signed)
Medical screening examination/treatment/procedure(s) were conducted as a shared visit with non-physician practitioner(s) and myself.  I personally evaluated the patient during the encounter.  Hx NICM, EF 35-40%, clean coronaries in 2013, presenting with 3 days of constant R sided chest pain, worse with palpation and arm movement.  Increased SOB with lying down.  No peripheral edema, lungs clear. CXR negative. Unchanged LBBB on EKG.  Suspect musculoskeletal pain.  Atypical for ACS or PE.   EKG Interpretation   Date/Time:  Friday September 03 2013 06:27:38 EDT Ventricular Rate:  86 PR Interval:  158 QRS Duration: 174 QT Interval:  452 QTC Calculation: 540 R Axis:   59 Text Interpretation:  Normal sinus rhythm Left bundle branch block  Abnormal ECG No significant change since last tracing Confirmed by Erroll Luna [phone]) on 09/03/2013 6:56:45 AM        Glynn Octave, MD 09/03/13 1800

## 2013-09-03 NOTE — ED Provider Notes (Signed)
Medical screening examination/treatment/procedure(s) were conducted as a shared visit with non-physician practitioner(s) and myself.  I personally evaluated the patient during the encounter.  See my additional note   EKG Interpretation   Date/Time:  Friday September 03 2013 06:27:38 EDT Ventricular Rate:  86 PR Interval:  158 QRS Duration: 174 QT Interval:  452 QTC Calculation: 540 R Axis:   59 Text Interpretation:  Normal sinus rhythm Left bundle branch block  Abnormal ECG No significant change since last tracing Confirmed by Erroll Luna [phone]) on 09/03/2013 6:56:45 AM       Glynn Octave, MD 09/03/13 [phone]

## 2013-09-03 NOTE — ED Notes (Signed)
Ambulating pulse ox on pt stayed in 90's. Lowest was 93 % on RA while walking.

## 2013-10-19 ENCOUNTER — Emergency Department (HOSPITAL_COMMUNITY)
Admission: EM | Admit: 2013-10-19 | Discharge: 2013-10-19 | Disposition: A | Discharge status: Home / Self Care | Payer: Medicaid Other | Attending: Emergency Medicine | Admitting: Emergency Medicine

## 2013-10-19 ENCOUNTER — Encounter (HOSPITAL_COMMUNITY): Payer: Self-pay | Admitting: Emergency Medicine

## 2013-10-19 DIAGNOSIS — Z9119 Patient's noncompliance with other medical treatment and regimen: Secondary | ICD-10-CM | POA: Insufficient documentation

## 2013-10-19 DIAGNOSIS — M545 Low back pain, unspecified: Secondary | ICD-10-CM

## 2013-10-19 DIAGNOSIS — Z79899 Other long term (current) drug therapy: Secondary | ICD-10-CM | POA: Insufficient documentation

## 2013-10-19 DIAGNOSIS — I10 Essential (primary) hypertension: Secondary | ICD-10-CM | POA: Insufficient documentation

## 2013-10-19 DIAGNOSIS — Z862 Personal history of diseases of the blood and blood-forming organs and certain disorders involving the immune mechanism: Secondary | ICD-10-CM | POA: Insufficient documentation

## 2013-10-19 DIAGNOSIS — Z91199 Patient's noncompliance with other medical treatment and regimen due to unspecified reason: Secondary | ICD-10-CM

## 2013-10-19 DIAGNOSIS — G8929 Other chronic pain: Secondary | ICD-10-CM | POA: Insufficient documentation

## 2013-10-19 DIAGNOSIS — Z87891 Personal history of nicotine dependence: Secondary | ICD-10-CM | POA: Insufficient documentation

## 2013-10-19 DIAGNOSIS — I5023 Acute on chronic systolic (congestive) heart failure: Secondary | ICD-10-CM | POA: Insufficient documentation

## 2013-10-19 DIAGNOSIS — M62830 Muscle spasm of back: Secondary | ICD-10-CM | POA: Insufficient documentation

## 2013-10-19 LAB — URINALYSIS, ROUTINE W REFLEX MICROSCOPIC
BILIRUBIN URINE: NEGATIVE
GLUCOSE, UA: NEGATIVE mg/dL
HGB URINE DIPSTICK: NEGATIVE
Ketones, ur: NEGATIVE mg/dL
Leukocytes, UA: NEGATIVE
Nitrite: NEGATIVE
PROTEIN: NEGATIVE mg/dL
Specific Gravity, Urine: 1.014 (ref 1.005–1.030)
UROBILINOGEN UA: 0.2 mg/dL (ref 0.0–1.0)
pH: 6.5 (ref 5.0–8.0)

## 2013-10-19 LAB — I-STAT CHEM 8, ED
BUN: 7 mg/dL (ref 6–23)
CREATININE: 0.8 mg/dL (ref 0.50–1.10)
Calcium, Ion: 1.29 mmol/L — ABNORMAL HIGH (ref 1.12–1.23)
Chloride: 108 mEq/L (ref 96–112)
Glucose, Bld: 101 mg/dL — ABNORMAL HIGH (ref 70–99)
HCT: 36 % (ref 36.0–46.0)
HEMOGLOBIN: 12.2 g/dL (ref 12.0–15.0)
Potassium: 3.9 mEq/L (ref 3.7–5.3)
Sodium: 142 mEq/L (ref 137–147)
TCO2: 22 mmol/L (ref 0–100)

## 2013-10-19 MED ORDER — HYDROCODONE-ACETAMINOPHEN 5-325 MG PO TABS: 1.0000 | ORAL_TABLET | Freq: Four times a day (QID) | ORAL | Status: DC | PRN

## 2013-10-19 MED ORDER — NAPROXEN 500 MG PO TABS: 500.0000 mg | ORAL_TABLET | Freq: Two times a day (BID) | ORAL | Status: DC | PRN

## 2013-10-19 MED ORDER — CYCLOBENZAPRINE HCL 10 MG PO TABS: 10.0000 mg | ORAL_TABLET | Freq: Three times a day (TID) | ORAL | Status: DC | PRN

## 2013-10-19 NOTE — ED Notes (Signed)
Pt states that she has had low back pain radiating down her legs. Hx of same. Alert and oriented.

## 2013-10-19 NOTE — Discharge Instructions (Signed)
Your blood pressure is very high, and you need to take your medications as directed. Avoid Goody[employer] powders as this will make your blood pressure worse. See your regular doctor for your blood pressure issues. Return to the ER for changes or worsening symptoms. Follow the instructions below for your back pain.  Back Pain:  Your back pain should be treated with medicines such as ibuprofen or aleve and this back pain should get better over the next 2 weeks.  However if you develop severe or worsening pain, low back pain with fever, numbness, weakness or inability to walk or urinate, you should return to the ER immediately.  Please follow up with your doctor this week for a recheck if still having symptoms.  Low back pain is discomfort in the lower back that may be due to injuries to muscles and ligaments around the spine.  Occasionally, it may be caused by a a problem to a part of the spine called a disc.  The pain may last several days or a week;  However, most patients get completely well in 4 weeks.  Self - care:  The application of heat can help soothe the pain.  Maintaining your daily activities, including walking, is encourged, as it will help you get better faster than just staying in bed. Perform gentle stretching as discussed. Drink plenty of fluids.  Non steroidal anti inflammatory medications including Ibuprofen and naproxen;  These medications help both pain and swelling and are very useful in treating back pain.  They should be taken with food, as they can cause stomach upset, and more seriously, stomach bleeding.    Medications are also useful to help with pain control.  A commonly prescribed medications includes norco. Use as directed as needed for pain, but don't drive or operate heavy machinery while using this.  Muscle relaxants:  These medications can help with muscle tightness that is a cause of lower back pain.  Most of these medications can cause drowsiness, and it is not safe to  drive or use dangerous machinery while taking them.  SEEK IMMEDIATE MEDICAL ATTENTION IF: New numbness, tingling, weakness, or problem with the use of your arms or legs.  Severe back pain not relieved with medications.  Difficulty with or loss of control of your bowel or bladder control.  Increasing pain in any areas of the body (such as chest or abdominal pain).  Shortness of breath, dizziness or fainting.  Nausea (feeling sick to your stomach), vomiting, fever, or sweats.  You will need to follow up with  Your primary healthcare provider in 1-2 weeks for reassessment.  If you do not have a doctor see the list below.   Chronic Back Pain  When back pain lasts longer than 3 months, it is called chronic back pain.People with chronic back pain often go through certain periods that are more intense (flare-ups).  CAUSES Chronic back pain can be caused by wear and tear (degeneration) on different structures in your back. These structures include:  The bones of your spine (vertebrae) and the joints surrounding your spinal cord and nerve roots (facets).  The strong, fibrous tissues that connect your vertebrae (ligaments). Degeneration of these structures may result in pressure on your nerves. This can lead to constant pain. HOME CARE INSTRUCTIONS  Avoid bending, heavy lifting, prolonged sitting, and activities which make the problem worse.  Take brief periods of rest throughout the day to reduce your pain. Lying down or standing usually is better than sitting while  you are resting.  Take over-the-counter or prescription medicines only as directed by your caregiver. SEEK IMMEDIATE MEDICAL CARE IF:   You have weakness or numbness in one of your legs or feet.  You have trouble controlling your bladder or bowels.  You have nausea, vomiting, abdominal pain, shortness of breath, or fainting. Document Released: 02/01/2004 Document Revised: 03/18/2011 Document Reviewed: 12/08/2010 [facility]  Patient Information 2015 [place], [place]. This information is not intended to replace advice given to you by your health care provider. Make sure you discuss any questions you have with your health care provider.  Back Exercises Back exercises help treat and prevent back injuries. The goal is to increase your strength in your belly (abdominal) and back muscles. These exercises can also help with flexibility. Start these exercises when told by your doctor. HOME CARE Back exercises include: Pelvic Tilt.  Lie on your back with your knees bent. Tilt your pelvis until the lower part of your back is against the floor. Hold this position 5 to 10 sec. Repeat this exercise 5 to 10 times. Knee to Chest.  Pull 1 knee up against your chest and hold for 20 to 30 seconds. Repeat this with the other knee. This may be done with the other leg straight or bent, whichever feels better. Then, pull both knees up against your chest. Sit-Ups or Curl-Ups.  Bend your knees 90 degrees. Start with tilting your pelvis, and do a partial, slow sit-up. Only lift your upper half 30 to 45 degrees off the floor. Take at least 2 to 3 seonds for each sit-up. Do not do sit-ups with your knees out straight. If partial sit-ups are difficult, simply do the above but with only tightening your belly (abdominal) muscles and holding it as told. Hip-Lift.  Lie on your back with your knees flexed 90 degrees. Push down with your feet and shoulders as you raise your hips 2 inches off the floor. Hold for 10 seconds, repeat 5 to 10 times. Back Arches.  Lie on your stomach. Prop yourself up on bent elbows. Slowly press on your hands, causing an arch in your low back. Repeat 3 to 5 times. Shoulder-Lifts.  Lie face down with arms beside your body. Keep hips and belly pressed to floor as you slowly lift your head and shoulders off the floor. Do not overdo your exercises. Be careful in the beginning. Exercises may cause you some mild back  discomfort. If the pain lasts for more than 15 minutes, stop the exercises until you see your doctor. Improvement with exercise for back problems is slow.  Document Released: 01/26/2010 Document Revised: 03/18/2011 Document Reviewed: 10/25/2010 [facility] Patient Information 2015 [place], [place]. This information is not intended to replace advice given to you by your health care provider. Make sure you discuss any questions you have with your health care provider.  Back Injury Prevention Back injuries can be extremely painful and difficult to heal. After having one back injury, you are much more likely to experience another later on. It is important to learn how to avoid injuring or re-injuring your back. The following tips can help you to prevent a back injury. PHYSICAL FITNESS  Exercise regularly and try to develop good tone in your abdominal muscles. Your abdominal muscles provide a lot of the support needed by your back.  Do aerobic exercises (walking, jogging, biking, swimming) regularly.  Do exercises that increase balance and strength (tai chi, yoga) regularly. This can decrease your risk of falling and injuring your  back.  Stretch before and after exercising.  Maintain a healthy weight. The more you weigh, the more stress is placed on your back. For every pound of weight, 10 times that amount of pressure is placed on the back. DIET  Talk to your caregiver about how much calcium and vitamin D you need per day. These nutrients help to prevent weakening of the bones (osteoporosis). Osteoporosis can cause broken (fractured) bones that lead to back pain.  Include good sources of calcium in your diet, such as dairy products, green, leafy vegetables, and products with calcium added (fortified).  Include good sources of vitamin D in your diet, such as milk and foods that are fortified with vitamin D.  Consider taking a nutritional supplement or a multivitamin if needed.  Stop smoking if you  smoke. POSTURE  Sit and stand up straight. Avoid leaning forward when you sit or hunching over when you stand.  Choose chairs with good low back (lumbar) support.  If you work at a desk, sit close to your work so you do not need to lean over. Keep your chin tucked in. Keep your neck drawn back and elbows bent at a right angle. Your arms should look like the letter "L."  Sit high and close to the steering wheel when you drive. Add a lumbar support to your car seat if needed.  Avoid sitting or standing in one position for too long. Take breaks to get up, stretch, and walk around at least once every hour. Take breaks if you are driving for long periods of time.  Sleep on your side with your knees slightly bent, or sleep on your back with a pillow under your knees. Do not sleep on your stomach. LIFTING, TWISTING, AND REACHING  Avoid heavy lifting, especially repetitive lifting. If you must do heavy lifting:  Stretch before lifting.  Work slowly.  Rest between lifts.  Use carts and dollies to move objects when possible.  Make several small trips instead of carrying 1 heavy load.  Ask for help when you need it.  Ask for help when moving big, awkward objects.  Follow these steps when lifting:  Stand with your feet shoulder-width apart.  Get as close to the object as you can. Do not try to pick up heavy objects that are far from your body.  Use handles or lifting straps if they are available.  Bend at your knees. Squat down, but keep your heels off the floor.  Keep your shoulders pulled back, your chin tucked in, and your back straight.  Lift the object slowly, tightening the muscles in your legs, abdomen, and buttocks. Keep the object as close to the center of your body as possible.  When you put a load down, use these same guidelines in reverse.  Do not:  Lift the object above your waist.  Twist at the waist while lifting or carrying a load. Move your feet if you need to  turn, not your waist.  Bend over without bending at your knees.  Avoid reaching over your head, across a table, or for an object on a high surface. OTHER TIPS  Avoid wet floors and keep sidewalks clear of ice to prevent falls.  Do not sleep on a mattress that is too soft or too hard.  Keep items that are used frequently within easy reach.  Put heavier objects on shelves at waist level and lighter objects on lower or higher shelves.  Find ways to decrease your stress, such  as exercise, massage, or relaxation techniques. Stress can build up in your muscles. Tense muscles are more vulnerable to injury.  Seek treatment for depression or anxiety if needed. These conditions can increase your risk of developing back pain. SEEK MEDICAL CARE IF:  You injure your back.  You have questions about diet, exercise, or other ways to prevent back injuries. MAKE SURE YOU:  Understand these instructions.  Will watch your condition.  Will get help right away if you are not doing well or get worse. Document Released: 02/01/2004 Document Revised: 03/18/2011 Document Reviewed: 02/04/2011 [facility] Patient Information 2015 [place], [place]. This information is not intended to replace advice given to you by your health care provider. Make sure you discuss any questions you have with your health care provider.  Heat Therapy Heat therapy can help make painful, stiff muscles and joints feel better. Do not use heat on new injuries. Wait at least 48 hours after an injury to use heat. Do not use heat when you have aches or pains right after an activity. If you still have pain 3 hours after stopping the activity, then you may use heat. HOME CARE Wet heat pack  Soak a clean towel in warm water. Squeeze out the extra water.  Put the warm, wet towel in a plastic bag.  Place a thin, dry towel between your skin and the bag.  Put the heat pack on the area for 5 minutes, and check your skin. Your skin may be  pink, but it should not be red.  Leave the heat pack on the area for 15 to 30 minutes.  Repeat this every 2 to 4 hours while awake. Do not use heat while you are sleeping. Warm water bath  Fill a tub with warm water.  Place the affected body part in the tub.  Soak the area for 20 to 40 minutes.  Repeat as needed. Hot water bottle  Fill the water bottle half full with hot water.  Press out the extra air. Close the cap tightly.  Place a dry towel between your skin and the bottle.  Put the bottle on the area for 5 minutes, and check your skin. Your skin may be pink, but it should not be red.  Leave the bottle on the area for 15 to 30 minutes.  Repeat this every 2 to 4 hours while awake. Electric heating pad  Place a dry towel between your skin and the heating pad.  Set the heating pad on low heat.  Put the heating pad on the area for 10 minutes, and check your skin. Your skin may be pink, but it should not be red.  Leave the heating pad on the area for 20 to 40 minutes.  Repeat this every 2 to 4 hours while awake.  Do not lie on the heating pad.  Do not fall asleep while using the heating pad.  Do not use the heating pad near water. GET HELP RIGHT AWAY IF:  You get blisters or red skin.  Your skin is puffy (swollen), or you lose feeling (numbness) in the affected area.  You have any new problems.  Your problems are getting worse.  You have any questions or concerns. If you have any problems, stop using heat therapy until you see your doctor. MAKE SURE YOU:  Understand these instructions.  Will watch your condition.  Will get help right away if you are not doing well or get worse. Document Released: 03/18/2011 Document Reviewed: 02/16/2013 [employer]  Patient Information 2015 [place]. This information is not intended to replace advice given to you by your health care provider. Make sure you discuss any questions you have with your health care  provider.  Hypertension Hypertension is another name for high blood pressure. High blood pressure forces your heart to work harder to pump blood. A blood pressure reading has two numbers, which includes a higher number over a lower number (example: 110/72). HOME CARE   Have your blood pressure rechecked by your doctor.  Only take medicine as told by your doctor. Follow the directions carefully. The medicine does not work as well if you skip doses. Skipping doses also puts you at risk for problems.  Do not smoke.  Monitor your blood pressure at home as told by your doctor. GET HELP IF:  You think you are having a reaction to the medicine you are taking.  You have repeat headaches or feel dizzy.  You have puffiness (swelling) in your ankles.  You have trouble with your vision. GET HELP RIGHT AWAY IF:   You get a very bad headache and are confused.  You feel weak, numb, or faint.  You get chest or belly (abdominal) pain.  You throw up (vomit).  You cannot breathe very well. MAKE SURE YOU:   Understand these instructions.  Will watch your condition.  Will get help right away if you are not doing well or get worse. Document Released: 06/12/2007 Document Revised: 12/29/2012 Document Reviewed: 10/16/2012 [facility] Patient Information 2015 [place], [place]. This information is not intended to replace advice given to you by your health care provider. Make sure you discuss any questions you have with your health care provider.  How to Take Your Blood Pressure HOW DO I GET A BLOOD PRESSURE MACHINE?  You can buy an electronic home blood pressure machine at your local pharmacy. Insurance will sometimes cover the cost if you have a prescription.  Ask your doctor what type of machine is best for you. There are different machines for your arm and your wrist.  If you decide to buy a machine to check your blood pressure on your arm, first check the size of your arm so you can buy the  right size cuff. To check the size of your arm:   Use a measuring tape that shows both inches and centimeters.   Wrap the measuring tape around the upper-middle part of your arm. You may need someone to help you measure.   Write down your arm measurement in both inches and centimeters.   To measure your blood pressure correctly, it is important to have the right size cuff.   If your arm is up to 13 inches (up to 34 centimeters), get an adult cuff size.  If your arm is 13 to 17 inches (35 to 44 centimeters), get a large adult cuff size.    If your arm is 17 to 20 inches (45 to 52 centimeters), get an adult thigh cuff.  WHAT DO THE NUMBERS MEAN?   There are two numbers that make up your blood pressure. For example: 120/80.  The first number (120 in our example) is called the "systolic pressure." It is a measure of the pressure in your blood vessels when your heart is pumping blood.  The second number (80 in our example) is called the "diastolic pressure." It is a measure of the pressure in your blood vessels when your heart is resting between beats.  Your doctor will tell you what  your blood pressure should be. WHAT SHOULD I DO BEFORE I CHECK MY BLOOD PRESSURE?   Try to rest or relax for at least 30 minutes before you check your blood pressure.  Do not smoke.  Do not have any drinks with caffeine, such as:  Soda.  Coffee.  Tea.  Check your blood pressure in a quiet room.  Sit down and stretch out your arm on a table. Keep your arm at about the level of your heart. Let your arm relax.  Make sure that your legs are not crossed. HOW DO I CHECK MY BLOOD PRESSURE?  Follow the directions that came with your machine.  Make sure you remove any tight-fighting clothing from your arm or wrist. Wrap the cuff around your upper arm or wrist. You should be able to fit a finger between the cuff and your arm. If you cannot fit a finger between the cuff and your arm, it is too tight  and should be removed and rewrapped.  Some units require you to manually pump up the arm cuff.  Automatic units inflate the cuff when you press a button.  Cuff deflation is automatic in both models.  After the cuff is inflated, the unit measures your blood pressure and pulse. The readings are shown on a monitor. Hold still and breathe normally while the cuff is inflated.  Getting a reading takes less than a minute.  Some models store readings in a memory. Some provide a printout of readings. If your machine does not store your readings, keep a written record.  Take readings with you to your next visit with your doctor. Document Released: 12/07/2007 Document Revised: 05/10/2013 Document Reviewed: 02/18/2013 [facility] Patient Information 2015 [place], [place]. This information is not intended to replace advice given to you by your health care provider. Make sure you discuss any questions you have with your health care provider.  Managing Your High Blood Pressure Blood pressure is a measurement of how forceful your blood is pressing against the walls of the arteries. Arteries are muscular tubes within the circulatory system. Blood pressure does not stay the same. Blood pressure rises when you are active, excited, or nervous; and it lowers during sleep and relaxation. If the numbers measuring your blood pressure stay above normal most of the time, you are at risk for health problems. High blood pressure (hypertension) is a long-term (chronic) condition in which blood pressure is elevated. A blood pressure reading is recorded as two numbers, such as 120 over 80 (or 120/80). The first, higher number is called the systolic pressure. It is a measure of the pressure in your arteries as the heart beats. The second, lower number is called the diastolic pressure. It is a measure of the pressure in your arteries as the heart relaxes between beats.  Keeping your blood pressure in a normal range is important to  your overall health and prevention of health problems, such as heart disease and stroke. When your blood pressure is uncontrolled, your heart has to work harder than normal. High blood pressure is a very common condition in adults because blood pressure tends to rise with age. Men and women are equally likely to have hypertension but at different times in life. Before age 40, men are more likely to have hypertension. After 35 years of age, women are more likely to have it. Hypertension is especially common in African Americans. This condition often has no signs or symptoms. The cause of the condition is usually not known.  Your caregiver can help you come up with a plan to keep your blood pressure in a normal, healthy range. BLOOD PRESSURE STAGES Blood pressure is classified into four stages: normal, prehypertension, stage 1, and stage 2. Your blood pressure reading will be used to determine what type of treatment, if any, is necessary. Appropriate treatment options are tied to these four stages:  Normal  Systolic pressure (mm Hg): below 120.  Diastolic pressure (mm Hg): below 80. Prehypertension  Systolic pressure (mm Hg): 120 to 139.  Diastolic pressure (mm Hg): 80 to 89. Stage1  Systolic pressure (mm Hg): 140 to 159.  Diastolic pressure (mm Hg): 90 to 99. Stage2  Systolic pressure (mm Hg): 160 or above.  Diastolic pressure (mm Hg): 100 or above. RISKS RELATED TO HIGH BLOOD PRESSURE Managing your blood pressure is an important responsibility. Uncontrolled high blood pressure can lead to:  A heart attack.  A stroke.  A weakened blood vessel (aneurysm).  Heart failure.  Kidney damage.  Eye damage.  Metabolic syndrome.  Memory and concentration problems. HOW TO MANAGE YOUR BLOOD PRESSURE Blood pressure can be managed effectively with lifestyle changes and medicines (if needed). Your caregiver will help you come up with a plan to bring your blood pressure within a normal  range. Your plan should include the following: Education  Read all information provided by your caregivers about how to control blood pressure.  Educate yourself on the latest guidelines and treatment recommendations. New research is always being done to further define the risks and treatments for high blood pressure. Lifestylechanges  Control your weight.  Avoid smoking.  Stay physically active.  Reduce the amount of salt in your diet.  Reduce stress.  Control any chronic conditions, such as high cholesterol or diabetes.  Reduce your alcohol intake. Medicines  Several medicines (antihypertensive medicines) are available, if needed, to bring blood pressure within a normal range. Communication  Review all the medicines you take with your caregiver because there may be side effects or interactions.  Talk with your caregiver about your diet, exercise habits, and other lifestyle factors that may be contributing to high blood pressure.  See your caregiver regularly. Your caregiver can help you create and adjust your plan for managing high blood pressure. RECOMMENDATIONS FOR TREATMENT AND FOLLOW-UP  The following recommendations are based on current guidelines for managing high blood pressure in nonpregnant adults. Use these recommendations to identify the proper follow-up period or treatment option based on your blood pressure reading. You can discuss these options with your caregiver.  Systolic pressure of 443 to 154 or diastolic pressure of 80 to 89: Follow up with your caregiver as directed.  Systolic pressure of 008 to 676 or diastolic pressure of 90 to 100: Follow up with your caregiver within 2 months.  Systolic pressure above 195 or diastolic pressure above 093: Follow up with your caregiver within 1 month.  Systolic pressure above 267 or diastolic pressure above 124: Consider antihypertensive therapy; follow up with your caregiver within 1 week.  Systolic pressure above  580 or diastolic pressure above 998: Begin antihypertensive therapy; follow up with your caregiver within 1 week. Document Released: 09/18/2011 Document Reviewed: 09/18/2011 [facility] Patient Information 2015 [place]. This information is not intended to replace advice given to you by your health care provider. Make sure you discuss any questions you have with your health care provider. Emergency Department Resource Guide 1) Find a Doctor and Pay Out of Pocket Although you won't have to find out  who is covered by your insurance plan, it is a good idea to ask around and get recommendations. You will then need to call the office and see if the doctor you have chosen will accept you as a new patient and what types of options they offer for patients who are self-pay. Some doctors offer discounts or will set up payment plans for their patients who do not have insurance, but you will need to ask so you aren't surprised when you get to your appointment.  2) Contact Your Local Health Department Not all health departments have doctors that can see patients for sick visits, but many do, so it is worth a call to see if yours does. If you don't know where your local health department is, you can check in your phone book. The CDC also has a tool to help you locate your state's health department, and many state websites also have listings of all of their local health departments.  3) Find a [facility] If your illness is not likely to be very severe or complicated, you may want to try a walk in clinic. These are popping up all over the country in pharmacies, drugstores, and shopping centers. They're usually staffed by nurse practitioners or physician assistants that have been trained to treat common illnesses and complaints. They're usually fairly quick and inexpensive. However, if you have serious medical issues or chronic medical problems, these are probably not your best option.  No Primary Care  Doctor: - Call [facility] at  [phone] - they can help you locate a primary care doctor that  accepts your insurance, provides certain services, etc. - Physician Referral Service- [phone]  Chronic Pain Problems: Organization         Address  Phone   Notes  [facility]  [phone] Patients need to be referred by their primary care doctor.   Medication Assistance: Organization         Address  Phone   Notes  [facility] Medication [facility] [address] [place], [place] [address] [phone] --Must be a resident of [facility] -- Must have NO insurance coverage whatsoever (no Medicaid/ Medicare, etc.) -- The pt. MUST have a primary care doctor that directs their care regularly and follows them in the community   [facility]  [phone]   [employer]  [phone]    Agencies that provide inexpensive medical care: Organization         Address  Phone   Notes  [place]  [phone]   [facility]    [phone]   [facility] [place], [place] [address] [phone]   [place] [address] [place] [phone]   [facility]    [phone]   [facility]    [phone]   [place] and [place] [address], [place] Phone:  [phone], Fax:  [phone] Hours of Operation:  9 am - 6 pm, M-F.  Also accepts Medicaid/Medicare and self-pay.  [facility] for [place] [address], [address], [place] Phone: [phone], Fax: [phone]. Hours of Operation:  8:30 am - 5:30 pm, M-F.  Also accepts Medicaid and self-pay.  [facility] [place] [address] [place] Phone: [phone]   [place], [place], [place] [phone],  Ext. [phone] Mondays & Thursdays: 7-9 AM.  First 15 patients are seen on a first  come, first serve basis.    [place] Providers:  Organization         Address  Phone   Notes  [facility] [address], [place] [phone] Also accepts self-pay patients.  [facility] [address], [place]  [phone]   [place], [address], [place] [phone]   [facility] [address], [place] [phone]   Lucianne Lei [address], [place]   [phone] Only accepts [place] [facility] [place] patients after they have their name applied to their card.   Self-Pay (no insurance) in [facility]:  Organization         Address  Phone   Notes  Sickle Cell Patients, [facility] [place] [phone]   [facility] [place] [phone]   [facility] [place]  [place], [place], [place] [phone]   [facility]/Dr. Osei-Bonsu  [address] [place] or [address], [place] [phone] Phone number for both [place] and [place] locations is the same.  [facility] and [facility] [address] [place] [phone]   [facility] [address] [place] or [address] [phone]   [facility] [address] [place] [phone], phone; [phone], fax Sees patients 1st and 3rd Saturday of every month.  Must not qualify for public or private insurance (i.e. Medicaid, Medicare, [facility] [employer], Veterans' Benefits)  Household income should be no more than 200% of the poverty level The clinic cannot treat you if you are pregnant or think you are pregnant  Sexually transmitted diseases are not treated at the clinic.

## 2013-10-19 NOTE — ED Provider Notes (Signed)
Medical screening examination/treatment/procedure(s) were performed by non-physician practitioner and as supervising physician I was immediately available for consultation/collaboration.   EKG Interpretation None       Compton Brigance, MD 10/19/13 2338 

## 2013-10-19 NOTE — ED Provider Notes (Signed)
CSN: [id number]     Arrival date & time 10/19/13  1728 History  This chart was scribed for non-physician practitioner Allen Derry, PA-C working with Arby Barrette, MD by Annye Asa, ED Scribe. This patient was seen in room WTR6/WTR6 and the patient's care was started at 6:50 PM.   Chief Complaint  Patient presents with  . Back Pain   Patient is a 33 y.o. female presenting with back pain. The history is provided by the patient. No language interpreter was used.  Back Pain Location:  Lumbar spine and gluteal region Quality:  Aching Radiates to:  R posterior upper leg and R thigh Pain severity:  Moderate Pain is:  Same all the time Onset quality:  Gradual Duration:  4 days Timing:  Constant Progression:  Unchanged Chronicity:  Recurrent Context: physical stress and twisting   Relieved by:  Heating pad, bed rest, OTC medications and NSAIDs Worsened by:  Standing and sitting (prolonged sitting or prolonged standing) Ineffective treatments:  None tried Associated symptoms: leg pain   Associated symptoms: no abdominal pain, no bladder incontinence, no chest pain, no dysuria, no fever, no headaches, no numbness, no paresthesias, no perianal numbness, no tingling and no weakness   Risk factors: obesity and vascular disease   Risk factors: no hx of cancer     HPI Comments: Laurie Wade is a 33 y.o. female with a PMHx of chronic systolic CHF, HTN noncompliant on meds, anemia, LBBB, cardiomyopathy, anemia, chronic HA, and chronic back pain with sciatica, who presents to the Emergency Department complaining of 3-4 days of gradual onset, constant, pulling and aching R sided lower back pain rated at 7/10. Patient reports that her pain radiates through her right buttock and down the front of her right thigh. She states that she has been resting at home due to pain for the past few days, but her pain worsened gradually yesterday. Patient explains that she has chronic back pain that  flares up every few months and this is consistent with her typical flare-ups.   She states that holding one position for too long is painful for her: sitting for too long, standing for too long, etc. She can ambulate without difficulty and this doesn't aggravate her pain. She has treated her pain with OTC pain medications (Tylenol, Goody's pain relief powders, muscle rubs) which have helped some. She denies numbness or tingling in her LEs. She denies paresthesias or weakness. She denies fevers, chills, HA, vision changes, neck pain, CP, SOB, cough, hemoptysis, upper back pain, jaw/arm pain, wheezing, abd pain, n/v/d/c, urinary symptoms, history of cancer, or cauda equina symptoms.   She reports that she has not taken her blood pressure medication in two days. She works on her feet; her job involves a lot of physical activity and twisting which she believes aggravated her back.   Past Medical History  Diagnosis Date  . Hypertension   . Chronic systolic heart failure 05/2011    a. NICM b. 5/13 LHC: nl cors c. 7/14 ECHO: EF 35-40% diff HK, MV calcified annulus, LA mild dil, MV triv regur, TV triv regur  . Systolic congestive heart failure   . Anemia   . Left bundle branch block   . Cardiomyopathy   . Iron deficiency anemia   . 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 Headache   . Non-compliance   . Acute on chronic systolic heart failure    Past Surgical History  Procedure Laterality Date  . Cesarean  section    . Cholecystectomy    . Tubal ligation     Family History  Problem Relation Age of Onset  . Heart failure Mother   . Hypertension Mother    History  Substance Use Topics  . Smoking status: Former Smoker  . Smokeless tobaGames developercco: Former NeurosurgeonUser    Quit date: 06/06/2006  . Alcohol Use: No   OB History   Grav Para Term Preterm Abortions TAB SAB Ect Mult Living                 Review of Systems  Constitutional: Negative for fever and chills.  HENT:  Negative for ear pain and sore throat.   Eyes: Negative for pain and visual disturbance.  Respiratory: Negative for cough and shortness of breath.   Cardiovascular: Negative for chest pain and leg swelling.  Gastrointestinal: Negative for nausea, vomiting, abdominal pain, diarrhea and constipation.  Genitourinary: Negative for bladder incontinence, dysuria, urgency, hematuria and flank pain.  Musculoskeletal: Positive for back pain. Negative for arthralgias, gait problem, joint swelling, myalgias, neck pain and neck stiffness.  Skin: Negative for color change.  Neurological: Negative for dizziness, tingling, syncope, weakness, numbness, headaches and paresthesias.  Psychiatric/Behavioral: Negative for confusion.    10 Systems reviewed and all are negative for acute change except as noted in the HPI.  Allergies  Doxycycline  Home Medications   Prior to Admission medications   Medication Sig Start Date End Date Taking? Authorizing Provider  carvedilol (COREG) 12.5 MG tablet Take 12.5 mg by mouth 2 (two) times daily with a meal.   Yes Historical Provider, MD  diphenhydrAMINE (BENADRYL) 25 mg capsule Take 25-50 mg by mouth every 6 (six) hours as needed for allergies (allergies).    Yes Historical Provider, MD  furosemide (LASIX) 40 MG tablet Take 1 tablet (40 mg total) by mouth daily. 07/26/13  Yes Dwana MelenaBryan W Hager, PA-C  ibuprofen (ADVIL,MOTRIN) 200 MG tablet Take 400-600 mg by mouth every 6 (six) hours as needed for headache or mild pain (leg & back pain).    Yes Historical Provider, MD  lisinopril (PRINIVIL,ZESTRIL) 20 MG tablet Take 20 mg by mouth at bedtime.    Yes Historical Provider, MD  spironolactone (ALDACTONE) 25 MG tablet Take 25 mg by mouth at bedtime.   Yes Historical Provider, MD   BP 183/110  Pulse 86  Temp(Src) 98.4 F (36.9 C) (Oral)  SpO2 97%  LMP 10/05/2013 Physical Exam  Nursing note and vitals reviewed. Constitutional: She is oriented to person, place, and time. She  appears well-developed. No distress.  Baseline HTN noted otherwise VSS, afebrile, NAD. Obese female.  HENT:  Head: Normocephalic and atraumatic.  Mouth/Throat: Oropharynx is clear and moist and mucous membranes are normal.  Eyes: Conjunctivae and EOM are normal. Pupils are equal, round, and reactive to light. Right eye exhibits no discharge. Left eye exhibits no discharge.  Neck: Normal range of motion. Neck supple. No JVD present. No spinous process tenderness and no muscular tenderness present. No rigidity. Normal range of motion present.  No JVD FROM intact without spinous process or paraspinous muscle TTP, no bony stepoffs or deformities, no muscle spasms. No rigidity or meningeal signs. No bruising or swelling.  Cardiovascular: Normal rate, regular rhythm, normal heart sounds and intact distal pulses.  Exam reveals no gallop and no friction rub.   No murmur heard. RRR, nl s1/s2, no m/r/g, distal pulses equal and symmetric in all extremities  Pulmonary/Chest: Effort normal and breath sounds normal. No  respiratory distress. She has no decreased breath sounds. She has no wheezes. She has no rhonchi. She has no rales.  CTAB in all lung fields, no increased WOB  Abdominal: Soft. Normal appearance and bowel sounds are normal. She exhibits no distension. There is no tenderness. There is no rigidity, no rebound, no guarding and no CVA tenderness.  Obese, soft, NTND, +BS throughout, no r/g/r, no CVA TTP  Musculoskeletal: Normal range of motion.       Lumbar back: She exhibits tenderness and spasm. She exhibits normal range of motion, no bony tenderness, no swelling, no deformity and normal pulse.       Back:  Cspine as above Thoracic spine without bony or paraspinous muscle TTP, no muscle spasm, no stepoffs or deformity Lumbar spine without midline bony TTP, mild R sided paraspinous muscle tenderness extending into the gluteal region with mild spasm noted. FROM intact.  R hip nonTTP in jointline  or with squeezing of hips, no greater trochanter tenderness. Upper thigh nonTTP without muscular spasm. R knee nonTTP. All joints of R lower extremity with FROM intact. Strength 5/5 in all extremities, sensation grossly intact in all extremities, distal pulses all intact. Gait nonantalgic. Neg SLR bilaterally although stretching pain in hamstrings elicited on R SLR.  Neurological: She is alert and oriented to person, place, and time. She has normal strength. No cranial nerve deficit or sensory deficit. Gait normal.  CN 2-12 grossly intact, sensation and strength grossly intact, gait nonataxic and nonantalgic  Skin: Skin is warm, dry and intact. No rash noted.  Psychiatric: She has a normal mood and affect. Her behavior is normal.    ED Course  Procedures   DIAGNOSTIC STUDIES: Oxygen Saturation is 97% on RA, adequate by my interpretation.    COORDINATION OF CARE: 6:58 PM Discussed treatment plan with pt at bedside and pt agreed to plan. Patient will be given antiinflammatories and muscle relaxants and was counseled with home care; heat treatments, etc.   Labs Review Labs Reviewed  I-STAT CHEM 8, ED - Abnormal; Notable for the following:    Glucose, Bld 101 (*)    Calcium, Ion 1.29 (*)    All other components within normal limits  URINALYSIS, ROUTINE W REFLEX MICROSCOPIC    Imaging Review No results found.   EKG Interpretation None      MDM   Final diagnoses:  Acute exacerbation of chronic low back pain  Lumbar paraspinal muscle spasm  HTN (hypertension), benign  Non-compliance    33y/o noncompliant HTN pt here for musculoskeletal back pain. Consistent with prior episodes of back pain. BP elevated in triage, didn't take her BP meds in 2 days,and BP within range that she typically is when she is seen in ED. Denies any current end-organ damage symptoms and exam does not reveal any end-organ damage signs, but decision made to obtain basic labs and U/A to ensure kidney function  was preserved and was not etiology of her pain. Given that pt had no chest pain or SOB, no CXR or EKG obtained. Labs neg, doubt end organ damage, or any other hypertensive emergency. Urged pt to be compliant on meds and to f/up with PCP this week. Discussed avoidance of goody's powders since this will aggravate her BP. Decision made not to proceed with giving any BP meds today since pt was asymptomatic and did not want to risk a dangerous drop in BP. For her back pain, doubt that this was a symptom of cardiovascular etiology, likely musculoskeletal given  that it's reproducible and began after twisting, as well as the fact that she has muscle spasms. No red flag s/s of low back pain. No s/s of central cord compression or cauda equina. Lower extremities are neurovascularly intact and patient is ambulating without difficulty. Advised on heat and massage use. Patient was counseled on back pain precautions and told to do activity as tolerated but do not lift, push, or pull heavy objects more than 10 pounds for the next week. Patient counseled to use ice or heat on back for no longer than 15 minutes every hour. Patient urged to follow-up with PCP if pain does not improve with treatment and rest or if pain becomes recurrent. Urged to return with worsening severe pain, loss of bowel or bladder control, trouble walking. The patient verbalizes understanding and agrees with the plan.   I personally performed the services described in this documentation, which was scribed in my presence. The recorded information has been reviewed and is accurate.  BP 194/115  Pulse 86  Temp(Src) 98.4 F (36.9 C) (Oral)  SpO2 97%  LMP 10/05/2013  Meds ordered this encounter  Medications  . naproxen (NAPROSYN) 500 MG tablet    Sig: Take 1 tablet (500 mg total) by mouth 2 (two) times daily as needed for mild pain, moderate pain or headache (TAKE WITH MEALS.).    Dispense:  20 tablet    Refill:  0    Order Specific Question:   Supervising Provider    Answer:  Eber HongMILLER, BRIAN D [[id number]]  . HYDROcodone-acetaminophen (NORCO) 5-325 MG per tablet    Sig: Take 1-2 tablets by mouth every 6 (six) hours as needed for severe pain.    Dispense:  10 tablet    Refill:  0    Order Specific Question:  Supervising Provider    Answer:  Eber HongMILLER, BRIAN D [[id number]]  . cyclobenzaprine (FLEXERIL) 10 MG tablet    Sig: Take 1 tablet (10 mg total) by mouth 3 (three) times daily as needed for muscle spasms.    Dispense:  15 tablet    Refill:  0    Order Specific Question:  Supervising Provider    Answer:  Vida RollerMILLER, BRIAN D [address][[id number]]     Davanta Meuser Strupp Camprubi-Soms, PA-C 10/19/13 2128

## 2013-11-03 ENCOUNTER — Emergency Department (HOSPITAL_BASED_OUTPATIENT_CLINIC_OR_DEPARTMENT_OTHER)
Admission: EM | Admit: 2013-11-03 | Discharge: 2013-11-03 | Disposition: A | Discharge status: Home / Self Care | Payer: Medicaid Other | Attending: Emergency Medicine | Admitting: Emergency Medicine

## 2013-11-03 ENCOUNTER — Encounter (HOSPITAL_BASED_OUTPATIENT_CLINIC_OR_DEPARTMENT_OTHER): Payer: Self-pay | Admitting: Emergency Medicine

## 2013-11-03 DIAGNOSIS — Z87891 Personal history of nicotine dependence: Secondary | ICD-10-CM | POA: Insufficient documentation

## 2013-11-03 DIAGNOSIS — M62830 Muscle spasm of back: Secondary | ICD-10-CM

## 2013-11-03 DIAGNOSIS — M5442 Lumbago with sciatica, left side: Secondary | ICD-10-CM | POA: Insufficient documentation

## 2013-11-03 DIAGNOSIS — I5023 Acute on chronic systolic (congestive) heart failure: Secondary | ICD-10-CM | POA: Insufficient documentation

## 2013-11-03 DIAGNOSIS — M5441 Lumbago with sciatica, right side: Secondary | ICD-10-CM

## 2013-11-03 DIAGNOSIS — I10 Essential (primary) hypertension: Secondary | ICD-10-CM | POA: Insufficient documentation

## 2013-11-03 DIAGNOSIS — Z79899 Other long term (current) drug therapy: Secondary | ICD-10-CM | POA: Insufficient documentation

## 2013-11-03 DIAGNOSIS — Z862 Personal history of diseases of the blood and blood-forming organs and certain disorders involving the immune mechanism: Secondary | ICD-10-CM | POA: Insufficient documentation

## 2013-11-03 DIAGNOSIS — I110 Hypertensive heart disease with heart failure: Secondary | ICD-10-CM | POA: Insufficient documentation

## 2013-11-03 MED ORDER — HYDROCODONE-ACETAMINOPHEN 5-325 MG PO TABS
1.0000 | ORAL_TABLET | Freq: Once | ORAL | Status: AC
  Administered 2013-11-03: 1 via ORAL
  Filled 2013-11-03: qty 1

## 2013-11-03 MED ORDER — TRAMADOL HCL 50 MG PO TABS: 50.0000 mg | ORAL_TABLET | Freq: Four times a day (QID) | ORAL | Status: DC | PRN

## 2013-11-03 MED ORDER — DIAZEPAM 5 MG PO TABS: 5.0000 mg | ORAL_TABLET | Freq: Two times a day (BID) | ORAL | Status: DC

## 2013-11-03 MED ORDER — DIAZEPAM 5 MG PO TABS
5.0000 mg | ORAL_TABLET | Freq: Once | ORAL | Status: AC
Start: 2013-11-03 — End: 2013-11-03
  Administered 2013-11-03: 5 mg via ORAL
  Filled 2013-11-03: qty 1

## 2013-11-03 NOTE — ED Notes (Signed)
Pt c/o lower back pain  which radiates down both legs x 1 d ay denies injury.

## 2013-11-03 NOTE — ED Notes (Signed)
Delay in pt discharge d/t pt wants something for pain prior to discharge and needs a driver. Pt has friend who is being seen in Ed and must wait until that pt is discharged until pain med given.

## 2013-11-03 NOTE — ED Provider Notes (Signed)
CSN: [id number]     Arrival date & time 11/03/13  1355 History   First MD Initiated Contact with Patient 11/03/13 1403     Chief Complaint  Patient presents with  . Back Pain     (Consider location/radiation/quality/duration/timing/severity/associated sxs/prior Treatment) HPI Comments: This is a 33 year old female with a past medical history of chronic systolic heart failure, left bundle branch block, anemia, hypertension and medication noncompliance who presents to the emergency department complaining of low back pain 1 day. Patient reports yesterday while she was at work she was lifting a bin and may have strained her back, later in the day she started to develop sharp, throbbing pain radiating across her low back and down both of her legs. Pain is constant, worse with certain movements, unrelieved by Tylenol and ibuprofen. Denies numbness or tingling down her extremities. Denies loss of control of bowels or bladder saddle anesthesia. No fever, chills or night sweats.  Patient is a 33 y.o. female presenting with back pain. The history is provided by the patient.  Back Pain   Past Medical History  Diagnosis Date  . Hypertension   . Chronic systolic heart failure 05/2011    a. NICM b. 5/13 LHC: nl cors c. 7/14 ECHO: EF 35-40% diff HK, MV calcified annulus, LA mild dil, MV triv regur, TV triv regur  . Systolic congestive heart failure   . Anemia   . Left bundle branch block   . Cardiomyopathy   . Iron deficiency anemia   . 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 Headache   . Non-compliance   . Acute on chronic systolic heart failure    Past Surgical History  Procedure Laterality Date  . Cesarean section    . Cholecystectomy    . Tubal ligation     Family History  Problem Relation Age of Onset  . Heart failure Mother   . Hypertension Mother    History  Substance Use Topics  . Smoking status: Former Games developermoker  . Smokeless tobacco: Former NeurosurgeonUser   Quit date: 06/06/2006  . Alcohol Use: No   OB History   Grav Para Term Preterm Abortions TAB SAB Ect Mult Living                 Review of Systems  Musculoskeletal: Positive for back pain.   10 Systems reviewed and are negative for acute change except as noted in the HPI.   Allergies  Doxycycline  Home Medications   Prior to Admission medications   Medication Sig Start Date End Date Taking? Authorizing Provider  carvedilol (COREG) 12.5 MG tablet Take 12.5 mg by mouth 2 (two) times daily with a meal.    Historical Provider, MD  cyclobenzaprine (FLEXERIL) 10 MG tablet Take 1 tablet (10 mg total) by mouth 3 (three) times daily as needed for muscle spasms. 10/19/13   Mercedes Strupp Camprubi-Soms, PA-C  diazepam (VALIUM) 5 MG tablet Take 1 tablet (5 mg total) by mouth 2 (two) times daily. 11/03/13   Kathrynn Speedobyn M Samanvi Cuccia, PA-C  diphenhydrAMINE (BENADRYL) 25 mg capsule Take 25-50 mg by mouth every 6 (six) hours as needed for allergies (allergies).     Historical Provider, MD  furosemide (LASIX) 40 MG tablet Take 1 tablet (40 mg total) by mouth daily. 07/26/13   Dwana MelenaBryan W Hager, PA-C  HYDROcodone-acetaminophen (NORCO) 5-325 MG per tablet Take 1-2 tablets by mouth every 6 (six) hours as needed for severe pain. 10/19/13   Mercedes Strupp Camprubi-Soms, PA-C  ibuprofen (ADVIL,MOTRIN) 200 MG tablet Take 400-600 mg by mouth every 6 (six) hours as needed for headache or mild pain (leg & back pain).     Historical Provider, MD  lisinopril (PRINIVIL,ZESTRIL) 20 MG tablet Take 20 mg by mouth at bedtime.     Historical Provider, MD  naproxen (NAPROSYN) 500 MG tablet Take 1 tablet (500 mg total) by mouth 2 (two) times daily as needed for mild pain, moderate pain or headache (TAKE WITH MEALS.). 10/19/13   Mercedes Strupp Camprubi-Soms, PA-C  spironolactone (ALDACTONE) 25 MG tablet Take 25 mg by mouth at bedtime.    Historical Provider, MD  traMADol (ULTRAM) 50 MG tablet Take 1 tablet (50 mg total) by mouth every 6  (six) hours as needed. 11/03/13   Lawerance Matsuo M Leonid Manus, PA-C   BP 171/112  Pulse 94  Temp(Src) 98.3 F (36.8 C) (Oral)  Resp 16  Ht 5\' 6"  (1.676 m)  Wt 200 lb (90.719 kg)  BMI 32.30 kg/m2  SpO2 100%  LMP 10/05/2013 Physical Exam  Nursing note and vitals reviewed. Constitutional: She is oriented to person, place, and time. She appears well-developed and well-nourished. No distress.  HENT:  Head: Normocephalic and atraumatic.  Mouth/Throat: Oropharynx is clear and moist.  Eyes: Conjunctivae are normal.  Neck: Normal range of motion. Neck supple. No spinous process tenderness and no muscular tenderness present.  Cardiovascular: Normal rate, regular rhythm and normal heart sounds.   Pulmonary/Chest: Effort normal and breath sounds normal. No respiratory distress.  Musculoskeletal: She exhibits no edema.  Tenderness to palpation across lower back. Muscle spasm noted in bilateral lumbar paraspinal muscles. Positive straight leg raise bilateral.  Neurological: She is alert and oriented to person, place, and time. She has normal strength.  Strength lower extremities 5/5 and equal bilateral. Sensation intact. Normal gait.  Skin: Skin is warm and dry. No rash noted. She is not diaphoretic.  Psychiatric: She has a normal mood and affect. Her behavior is normal.    ED Course  Procedures (including critical care time) Labs Review Labs Reviewed - No data to display  Imaging Review No results found.   EKG Interpretation None      MDM   Final diagnoses:  Bilateral low back pain with sciatica, sciatica laterality unspecified  Muscle spasm of back   patient with back pain after lifting something heavy. She is nontoxic appearing and in no apparent distress. Afebrile, hypertensive, vitals otherwise stable. She is noncompliant with her blood pressure medication. She is aware she needs to take these medications. No red flags concerning patient's back pain. No signs or symptoms of central cord  compression or cauda equina. She is able to ambulate without difficulty. Will treat with Valium and tramadol. Discussed importance of follow-up with PCP. Stable for discharge. Return precautions given. Patient states understanding of treatment care plan and is agreeable.  Kathrynn SpeedRobyn M Aulden Calise, PA-C 11/03/13 1435

## 2013-11-03 NOTE — Discharge Instructions (Signed)
Take Valium as needed as directed for muscle spasm. No driving or operating heavy machinery while taking valium. This medication may cause drowsiness. Take tramadol as directed for pain. Rest, avoid heavy lifting or hard physical activity for the next few days. Follow-up with your primary care physician.  Back Pain, Adult Back pain is very common. The pain often gets better over time. The cause of back pain is usually not dangerous. Most people can learn to manage their back pain on their own.  HOME CARE   Stay active. Start with short walks on flat ground if you can. Try to walk farther each day.  Do not sit, drive, or stand in one place for more than 30 minutes. Do not stay in bed.  Do not avoid exercise or work. Activity can help your back heal faster.  Be careful when you bend or lift an object. Bend at your knees, keep the object close to you, and do not twist.  Sleep on a firm mattress. Lie on your side, and bend your knees. If you lie on your back, put a pillow under your knees.  Only take medicines as told by your doctor.  Put ice on the injured area.  Put ice in a plastic bag.  Place a towel between your skin and the bag.  Leave the ice on for 15-20 minutes, 03-04 times a day for the first 2 to 3 days. After that, you can switch between ice and heat packs.  Ask your doctor about back exercises or massage.  Avoid feeling anxious or stressed. Find good ways to deal with stress, such as exercise. GET HELP RIGHT AWAY IF:   Your pain does not go away with rest or medicine.  Your pain does not go away in 1 week.  You have new problems.  You do not feel well.  The pain spreads into your legs.  You cannot control when you poop (bowel movement) or pee (urinate).  Your arms or legs feel weak or lose feeling (numbness).  You feel sick to your stomach (nauseous) or throw up (vomit).  You have belly (abdominal) pain.  You feel like you may pass out (faint). MAKE SURE  YOU:   Understand these instructions.  Will watch your condition.  Will get help right away if you are not doing well or get worse. Document Released: 06/12/2007 Document Revised: 03/18/2011 Document Reviewed: 04/27/2013 [facility] Patient Information 2015 [place], [place]. This information is not intended to replace advice given to you by your health care provider. Make sure you discuss any questions you have with your health care provider.  Muscle Cramps and Spasms Muscle cramps and spasms occur when a muscle or muscles tighten and you have no control over this tightening (involuntary muscle contraction). They are a common problem and can develop in any muscle. The most common place is in the calf muscles of the leg. Both muscle cramps and muscle spasms are involuntary muscle contractions, but they also have differences:   Muscle cramps are sporadic and painful. They may last a few seconds to a quarter of an hour. Muscle cramps are often more forceful and last longer than muscle spasms.  Muscle spasms may or may not be painful. They may also last just a few seconds or much longer. CAUSES  It is uncommon for cramps or spasms to be due to a serious underlying problem. In many cases, the cause of cramps or spasms is unknown. Some common causes are:   Overexertion.  Overuse from repetitive motions (doing the same thing over and over).   Remaining in a certain position for a long period of time.   Improper preparation, form, or technique while performing a sport or activity.   Dehydration.   Injury.   Side effects of some medicines.   Abnormally low levels of the salts and ions in your blood (electrolytes), especially potassium and calcium. This could happen if you are taking water pills (diuretics) or you are pregnant.  Some underlying medical problems can make it more likely to develop cramps or spasms. These include, but are not limited to:   Diabetes.   Parkinson  disease.   Hormone disorders, such as thyroid problems.   Alcohol abuse.   Diseases specific to muscles, joints, and bones.   Blood vessel disease where not enough blood is getting to the muscles.  HOME CARE INSTRUCTIONS   Stay well hydrated. Drink enough water and fluids to keep your urine clear or pale yellow.  It may be helpful to massage, stretch, and relax the affected muscle.  For tight or tense muscles, use a warm towel, heating pad, or hot shower water directed to the affected area.  If you are sore or have pain after a cramp or spasm, applying ice to the affected area may relieve discomfort.  Put ice in a plastic bag.  Place a towel between your skin and the bag.  Leave the ice on for 15-20 minutes, 03-04 times a day.  Medicines used to treat a known cause of cramps or spasms may help reduce their frequency or severity. Only take over-the-counter or prescription medicines as directed by your caregiver. SEEK MEDICAL CARE IF:  Your cramps or spasms get more severe, more frequent, or do not improve over time.  MAKE SURE YOU:   Understand these instructions.  Will watch your condition.  Will get help right away if you are not doing well or get worse. Document Released: 06/15/2001 Document Revised: 04/20/2012 Document Reviewed: 12/11/2011 [facility] Patient Information 2015 [place], [place]. This information is not intended to replace advice given to you by your health care provider. Make sure you discuss any questions you have with your health care provider.  Sciatica Sciatica is pain, weakness, numbness, or tingling along the path of the sciatic nerve. The nerve starts in the lower back and runs down the back of each leg. The nerve controls the muscles in the lower leg and in the back of the knee, while also providing sensation to the back of the thigh, lower leg, and the sole of your foot. Sciatica is a symptom of another medical condition. For instance, nerve damage  or certain conditions, such as a herniated disk or bone spur on the spine, pinch or put pressure on the sciatic nerve. This causes the pain, weakness, or other sensations normally associated with sciatica. Generally, sciatica only affects one side of the body. CAUSES   Herniated or slipped disc.  Degenerative disk disease.  A pain disorder involving the narrow muscle in the buttocks (piriformis syndrome).  Pelvic injury or fracture.  Pregnancy.  Tumor (rare). SYMPTOMS  Symptoms can vary from mild to very severe. The symptoms usually travel from the low back to the buttocks and down the back of the leg. Symptoms can include:  Mild tingling or dull aches in the lower back, leg, or hip.  Numbness in the back of the calf or sole of the foot.  Burning sensations in the lower back, leg, or hip.  Sharp pains in the lower back, leg, or hip.  Leg weakness.  Severe back pain inhibiting movement. These symptoms may get worse with coughing, sneezing, laughing, or prolonged sitting or standing. Also, being overweight may worsen symptoms. DIAGNOSIS  Your caregiver will perform a physical exam to look for common symptoms of sciatica. He or she may ask you to do certain movements or activities that would trigger sciatic nerve pain. Other tests may be performed to find the cause of the sciatica. These may include:  Blood tests.  X-rays.  Imaging tests, such as an MRI or CT scan. TREATMENT  Treatment is directed at the cause of the sciatic pain. Sometimes, treatment is not necessary and the pain and discomfort goes away on its own. If treatment is needed, your caregiver may suggest:  Over-the-counter medicines to relieve pain.  Prescription medicines, such as anti-inflammatory medicine, muscle relaxants, or narcotics.  Applying heat or ice to the painful area.  Steroid injections to lessen pain, irritation, and inflammation around the nerve.  Reducing activity during periods of  pain.  Exercising and stretching to strengthen your abdomen and improve flexibility of your spine. Your caregiver may suggest losing weight if the extra weight makes the back pain worse.  Physical therapy.  Surgery to eliminate what is pressing or pinching the nerve, such as a bone spur or part of a herniated disk. HOME CARE INSTRUCTIONS   Only take over-the-counter or prescription medicines for pain or discomfort as directed by your caregiver.  Apply ice to the affected area for 20 minutes, 3-4 times a day for the first 48-72 hours. Then try heat in the same way.  Exercise, stretch, or perform your usual activities if these do not aggravate your pain.  Attend physical therapy sessions as directed by your caregiver.  Keep all follow-up appointments as directed by your caregiver.  Do not wear high heels or shoes that do not provide proper support.  Check your mattress to see if it is too soft. A firm mattress may lessen your pain and discomfort. SEEK IMMEDIATE MEDICAL CARE IF:   You lose control of your bowel or bladder (incontinence).  You have increasing weakness in the lower back, pelvis, buttocks, or legs.  You have redness or swelling of your back.  You have a burning sensation when you urinate.  You have pain that gets worse when you lie down or awakens you at night.  Your pain is worse than you have experienced in the past.  Your pain is lasting longer than 4 weeks.  You are suddenly losing weight without reason. MAKE SURE YOU:  Understand these instructions.  Will watch your condition.  Will get help right away if you are not doing well or get worse. Document Released: 12/18/2000 Document Revised: 06/25/2011 Document Reviewed: 05/05/2011 [facility] Patient Information 2015 [place], [place]. This information is not intended to replace advice given to you by your health care provider. Make sure you discuss any questions you have with your health care provider.

## 2013-11-05 NOTE — ED Provider Notes (Signed)
Medical screening examination/treatment/procedure(s) were performed by non-physician practitioner and as supervising physician I was immediately available for consultation/collaboration.   EKG Interpretation None        Matthew Gentry, MD 11/05/13 0705 

## 2013-11-30 ENCOUNTER — Encounter (HOSPITAL_COMMUNITY): Payer: Self-pay | Admitting: Emergency Medicine

## 2013-11-30 ENCOUNTER — Emergency Department (HOSPITAL_COMMUNITY)
Admission: EM | Admit: 2013-11-30 | Discharge: 2013-11-30 | Disposition: A | Discharge status: Home / Self Care | Payer: Medicaid Other | Attending: Emergency Medicine | Admitting: Emergency Medicine

## 2013-11-30 DIAGNOSIS — I5023 Acute on chronic systolic (congestive) heart failure: Secondary | ICD-10-CM | POA: Insufficient documentation

## 2013-11-30 DIAGNOSIS — M79641 Pain in right hand: Secondary | ICD-10-CM | POA: Insufficient documentation

## 2013-11-30 DIAGNOSIS — Z87891 Personal history of nicotine dependence: Secondary | ICD-10-CM | POA: Insufficient documentation

## 2013-11-30 DIAGNOSIS — Z79899 Other long term (current) drug therapy: Secondary | ICD-10-CM | POA: Insufficient documentation

## 2013-11-30 DIAGNOSIS — I5022 Chronic systolic (congestive) heart failure: Secondary | ICD-10-CM | POA: Insufficient documentation

## 2013-11-30 DIAGNOSIS — Z862 Personal history of diseases of the blood and blood-forming organs and certain disorders involving the immune mechanism: Secondary | ICD-10-CM | POA: Insufficient documentation

## 2013-11-30 DIAGNOSIS — I10 Essential (primary) hypertension: Secondary | ICD-10-CM | POA: Insufficient documentation

## 2013-11-30 DIAGNOSIS — Z9119 Patient's noncompliance with other medical treatment and regimen: Secondary | ICD-10-CM | POA: Insufficient documentation

## 2013-11-30 MED ORDER — NAPROXEN 500 MG PO TABS: 500.0000 mg | ORAL_TABLET | Freq: Two times a day (BID) | ORAL | Status: DC

## 2013-11-30 NOTE — ED Notes (Signed)
Pt reports that her r/arm and hand started throbbing and woke her at 1100 am. Pt works night shift. Pt noted repetative use of the r/hand at work. Denies trauma

## 2013-11-30 NOTE — Discharge Instructions (Signed)
You were evaluated in the ED today for your hand pain. There does not appear to be any acute or emergent cause for your pain. Your symptoms are likely due to overuse. Please take the naproxen as directed for 2 weeks for pain and inflammation management. [facility]. Please follow-up with primary care for further evaluation and management. Please return to ED if you begin to experience fevers, numbness, weakness, redness or swelling to any of your joints.

## 2013-11-30 NOTE — ED Provider Notes (Signed)
CSN: [id number]     Arrival date & time 11/30/13  1257 History  This chart was scribed for non-physician practitioner [name], working with No att. providers found by Carl Best, ED Scribe. This patient was seen in room WTR8/WTR8 and the patient's care was started at 1:43 PM.    Chief Complaint  Patient presents with  . Hand Pain  . Arm Pain    6 hr hx of peristant arm and hand pain   Patient is a 33 y.o. female presenting with hand pain and arm pain. The history is provided by the patient. No language interpreter was used.  Hand Pain Pertinent negatives include no abdominal pain.  Arm Pain Pertinent negatives include no abdominal pain.   HPI Comments: Laurie Wade is a 33 y.o. female who presents to the Emergency Department complaining of constant, throbbing, shooting right hand that radiates throughout her right arm that woke her out of her sleep at 11 AM this morning.  She lists numbness as an associated symptom.  She has taken Ibuprofen and applied topical "Blue Ice" medication to the area with no relief to her symptoms.  She does not have a prior history of these symptoms.  She is right hand dominant.  She states that her job at [employer] requires her to be chopping things constantly.     Past Medical History  Diagnosis Date  . Hypertension   . Chronic systolic heart failure 05/2011    a. NICM b. 5/13 LHC: nl cors c. 7/14 ECHO: EF 35-40% diff HK, MV calcified annulus, LA mild dil, MV triv regur, TV triv regur  . Systolic congestive heart failure   . Anemia   . Left bundle branch block   . Cardiomyopathy   . Iron deficiency anemia   . 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 Headache   . Non-compliance   . Acute on chronic systolic heart failure    Past Surgical History  Procedure Laterality Date  . Cesarean section    . Cholecystectomy    . Tubal ligation     Family History  Problem Relation Age of Onset  . Heart failure Mother    . Hypertension Mother    History  Substance Use Topics  . Smoking status: Former Games developer  . Smokeless tobacco: Former Neurosurgeon    Quit date: 06/06/2006  . Alcohol Use: No   OB History    No data available     Review of Systems  Gastrointestinal: Negative for abdominal pain.  Musculoskeletal: Positive for arthralgias.  Neurological: Positive for numbness.  All other systems reviewed and are negative.   Allergies  Doxycycline  Home Medications   Prior to Admission medications   Medication Sig Start Date End Date Taking? Authorizing Provider  carvedilol (COREG) 12.5 MG tablet Take 12.5 mg by mouth 2 (two) times daily with a meal.   Yes Historical Provider, MD  cyclobenzaprine (FLEXERIL) 10 MG tablet Take 1 tablet (10 mg total) by mouth 3 (three) times daily as needed for muscle spasms. 10/19/13  Yes Mercedes Strupp Camprubi-Soms, PA-C  diphenhydrAMINE (BENADRYL) 25 mg capsule Take 25-50 mg by mouth every 6 (six) hours as needed for allergies (allergies).    Yes Historical Provider, MD  furosemide (LASIX) 40 MG tablet Take 1 tablet (40 mg total) by mouth daily. 07/26/13  Yes Dwana Melena, PA-C  ibuprofen (ADVIL,MOTRIN) 200 MG tablet Take 400-600 mg by mouth every 6 (six) hours as needed for headache  or mild pain (leg & back pain).    Yes Historical Provider, MD  lisinopril (PRINIVIL,ZESTRIL) 20 MG tablet Take 20 mg by mouth at bedtime.    Yes Historical Provider, MD  spironolactone (ALDACTONE) 25 MG tablet Take 25 mg by mouth at bedtime.   Yes Historical Provider, MD  diazepam (VALIUM) 5 MG tablet Take 1 tablet (5 mg total) by mouth 2 (two) times daily. Patient not taking: Reported on 11/30/2013 11/03/13   Kathrynn Speedobyn M Hess, PA-C  HYDROcodone-acetaminophen (NORCO) 5-325 MG per tablet Take 1-2 tablets by mouth every 6 (six) hours as needed for severe pain. Patient not taking: Reported on 11/30/2013 10/19/13   Donnita FallsMercedes Strupp Camprubi-Soms, PA-C  naproxen (NAPROSYN) 500 MG tablet Take 1  tablet (500 mg total) by mouth 2 (two) times daily. 11/30/13   Earle GellBenjamin W Juniel Groene, PA-C  traMADol (ULTRAM) 50 MG tablet Take 1 tablet (50 mg total) by mouth every 6 (six) hours as needed. Patient not taking: Reported on 11/30/2013 11/03/13   Nada Boozerobyn M Hess, PA-C   BP 131/79 mmHg  Pulse 91  Temp(Src) 97.9 F (36.6 C) (Oral)  Resp 18  SpO2 100%  LMP 11/16/2013 (Exact Date)   Physical Exam  Constitutional: She is oriented to person, place, and time. She appears well-developed and well-nourished.  HENT:  Head: Normocephalic and atraumatic.  Mouth/Throat: Oropharynx is clear and moist.  Eyes: EOM are normal.  Neck: Normal range of motion.  Cardiovascular: Normal rate, regular rhythm, normal heart sounds and intact distal pulses.  Exam reveals no gallop and no friction rub.   No murmur heard. Capillary refill less than 2 seconds.    Pulmonary/Chest: Effort normal and breath sounds normal. No respiratory distress. She has no wheezes. She has no rales.  Abdominal: Soft. She exhibits no mass. There is no tenderness. There is no rebound and no guarding.  Musculoskeletal: Normal range of motion.  Full range of motion.  Elbow able to flex and extend against resistance.  Wrist has appropriate dorsiflexion and volar flexion.  Cardinal hand movements performed without difficult.  No overt erythema, edema, or effusion to any of the large joints.  Positive tinel's sign.    Neurological: She is alert and oriented to person, place, and time.  Skin: Skin is warm and dry.  Psychiatric: She has a normal mood and affect. Her behavior is normal.  Nursing note and vitals reviewed.   ED Course  Procedures (including critical care time)  DIAGNOSTIC STUDIES: Oxygen Saturation is 100% on room air, normal by my interpretation.    COORDINATION OF CARE: 1:46 PM- Discussed a clinical suspicion of carpal tunnel and tendonitis.  Will treat with Aleve and ice and heat therapy.  The patient agreed to the treatment  plan.   Labs Review Labs Reviewed - No data to display  Imaging Review No results found.   EKG Interpretation None      MDM  Vitals stable - WNL -afebrile Pt resting comfortably in ED. PE--not concerning for other acute or emergent pathology.  Low concern for hemarthrosis, septic joint. Normal neuro exam. Patient maintains full range of motion of entire limb, no overt erythema or swelling to any of the joints. No focal tenderness. Compartments soft. Symptoms likely multifactorial and due to overuse injury and potentially sleeping on her wrist wrong. Will treat conservatively with NSAIDs and rice therapy.  Discussed f/u with PCP and return precautions, pt very amenable to plan. Patient stable, in good condition and is appropriate for discharge  Final  diagnoses:  Hand pain, right   I personally performed the services described in this documentation, which was scribed in my presence. The recorded information has been reviewed and is accurate.    Sharlene MottsBenjamin W Shewanda Sharpe, PA-C 11/30/13 1610  Samuel JesterKathleen McManus, DO 11/30/13 1650

## 2013-12-16 ENCOUNTER — Encounter (HOSPITAL_COMMUNITY): Payer: Self-pay | Admitting: Internal Medicine

## 2013-12-25 ENCOUNTER — Encounter (HOSPITAL_BASED_OUTPATIENT_CLINIC_OR_DEPARTMENT_OTHER): Payer: Self-pay | Admitting: *Deleted

## 2013-12-25 ENCOUNTER — Emergency Department (HOSPITAL_BASED_OUTPATIENT_CLINIC_OR_DEPARTMENT_OTHER)
Admission: EM | Admit: 2013-12-25 | Discharge: 2013-12-25 | Disposition: A | Discharge status: Home / Self Care | Payer: Medicaid Other | Attending: Emergency Medicine | Admitting: Emergency Medicine

## 2013-12-25 DIAGNOSIS — M79604 Pain in right leg: Secondary | ICD-10-CM

## 2013-12-25 DIAGNOSIS — M79662 Pain in left lower leg: Secondary | ICD-10-CM | POA: Insufficient documentation

## 2013-12-25 DIAGNOSIS — Z87891 Personal history of nicotine dependence: Secondary | ICD-10-CM | POA: Insufficient documentation

## 2013-12-25 DIAGNOSIS — Z79899 Other long term (current) drug therapy: Secondary | ICD-10-CM | POA: Insufficient documentation

## 2013-12-25 DIAGNOSIS — Z791 Long term (current) use of non-steroidal anti-inflammatories (NSAID): Secondary | ICD-10-CM | POA: Insufficient documentation

## 2013-12-25 DIAGNOSIS — I5023 Acute on chronic systolic (congestive) heart failure: Secondary | ICD-10-CM | POA: Insufficient documentation

## 2013-12-25 DIAGNOSIS — M549 Dorsalgia, unspecified: Secondary | ICD-10-CM | POA: Insufficient documentation

## 2013-12-25 DIAGNOSIS — I10 Essential (primary) hypertension: Secondary | ICD-10-CM | POA: Insufficient documentation

## 2013-12-25 DIAGNOSIS — G8929 Other chronic pain: Secondary | ICD-10-CM | POA: Insufficient documentation

## 2013-12-25 DIAGNOSIS — Z9889 Other specified postprocedural states: Secondary | ICD-10-CM | POA: Insufficient documentation

## 2013-12-25 DIAGNOSIS — Z3202 Encounter for pregnancy test, result negative: Secondary | ICD-10-CM | POA: Insufficient documentation

## 2013-12-25 DIAGNOSIS — M79661 Pain in right lower leg: Secondary | ICD-10-CM | POA: Insufficient documentation

## 2013-12-25 DIAGNOSIS — M79605 Pain in left leg: Secondary | ICD-10-CM

## 2013-12-25 DIAGNOSIS — E876 Hypokalemia: Secondary | ICD-10-CM | POA: Insufficient documentation

## 2013-12-25 DIAGNOSIS — Z862 Personal history of diseases of the blood and blood-forming organs and certain disorders involving the immune mechanism: Secondary | ICD-10-CM | POA: Insufficient documentation

## 2013-12-25 LAB — BASIC METABOLIC PANEL
Anion gap: 11 (ref 5–15)
BUN: 7 mg/dL (ref 6–23)
CHLORIDE: 106 meq/L (ref 96–112)
CO2: 27 meq/L (ref 19–32)
CREATININE: 0.7 mg/dL (ref 0.50–1.10)
Calcium: 9.6 mg/dL (ref 8.4–10.5)
GFR calc non Af Amer: >90 mL/min (ref >90)
Glucose, Bld: 114 mg/dL — ABNORMAL HIGH (ref 70–99)
Potassium: 3.5 mEq/L — ABNORMAL LOW (ref 3.7–5.3)
Sodium: 144 mEq/L (ref 137–147)

## 2013-12-25 LAB — URINALYSIS, ROUTINE W REFLEX MICROSCOPIC
Bilirubin Urine: NEGATIVE
Glucose, UA: NEGATIVE mg/dL
HGB URINE DIPSTICK: NEGATIVE
Ketones, ur: NEGATIVE mg/dL
Leukocytes, UA: NEGATIVE
Nitrite: NEGATIVE
PH: 7.5 (ref 5.0–8.0)
PROTEIN: NEGATIVE mg/dL
Specific Gravity, Urine: 1.011 (ref 1.005–1.030)
Urobilinogen, UA: 1 mg/dL (ref 0.0–1.0)

## 2013-12-25 LAB — PREGNANCY, URINE: Preg Test, Ur: NEGATIVE

## 2013-12-25 MED ORDER — HYDROCODONE-ACETAMINOPHEN 5-325 MG PO TABS: 1.0000 | ORAL_TABLET | ORAL | Status: DC | PRN

## 2013-12-25 MED ORDER — OXYCODONE-ACETAMINOPHEN 5-325 MG PO TABS
2.0000 | ORAL_TABLET | Freq: Once | ORAL | Status: AC
  Administered 2013-12-25: 2 via ORAL
  Filled 2013-12-25: qty 2

## 2013-12-25 MED ORDER — IBUPROFEN 600 MG PO TABS: 600.0000 mg | ORAL_TABLET | Freq: Four times a day (QID) | ORAL | Status: DC | PRN

## 2013-12-25 MED ORDER — POTASSIUM CHLORIDE CRYS ER 20 MEQ PO TBCR
40.0000 meq | EXTENDED_RELEASE_TABLET | Freq: Once | ORAL | Status: AC
Start: 2013-12-25 — End: 2013-12-25
  Administered 2013-12-25: 40 meq via ORAL
  Filled 2013-12-25: qty 2

## 2013-12-25 NOTE — ED Notes (Signed)
Pt reports bilateral leg pain (R<L) x 1 day. Ambulatory.

## 2013-12-25 NOTE — Discharge Instructions (Signed)
Hypokalemia Hypokalemia means that the amount of potassium in the blood is lower than normal.Potassium is a chemical, called an electrolyte, that helps regulate the amount of fluid in the body. It also stimulates muscle contraction and helps nerves function properly.Most of the body's potassium is inside of cells, and only a very small amount is in the blood. Because the amount in the blood is so small, minor changes can be life-threatening. CAUSES  Antibiotics.  Diarrhea or vomiting.  Using laxatives too much, which can cause diarrhea.  Chronic kidney disease.  Water pills (diuretics).  Eating disorders (bulimia).  Low magnesium level.  Sweating a lot. SIGNS AND SYMPTOMS  Weakness.  Constipation.  Fatigue.  Muscle cramps.  Mental confusion.  Skipped heartbeats or irregular heartbeat (palpitations).  Tingling or numbness. DIAGNOSIS  Your health care provider can diagnose hypokalemia with blood tests. In addition to checking your potassium level, your health care provider may also check other lab tests. TREATMENT Hypokalemia can be treated with potassium supplements taken by mouth or adjustments in your current medicines. If your potassium level is very low, you may need to get potassium through a vein (IV) and be monitored in the hospital. A diet high in potassium is also helpful. Foods high in potassium are:  Nuts, such as peanuts and pistachios.  Seeds, such as sunflower seeds and pumpkin seeds.  Peas, lentils, and lima beans.  Whole grain and bran cereals and breads.  Fresh fruit and vegetables, such as apricots, avocado, bananas, cantaloupe, kiwi, oranges, tomatoes, asparagus, and potatoes.  Orange and tomato juices.  Red meats.  Fruit yogurt. HOME CARE INSTRUCTIONS  Take all medicines as prescribed by your health care provider.  Maintain a healthy diet by including nutritious food, such as fruits, vegetables, nuts, whole grains, and lean meats.  If  you are taking a laxative, be sure to follow the directions on the label. SEEK MEDICAL CARE IF:  Your weakness gets worse.  You feel your heart pounding or racing.  You are vomiting or having diarrhea.  You are diabetic and having trouble keeping your blood glucose in the normal range. SEEK IMMEDIATE MEDICAL CARE IF:  You have chest pain, shortness of breath, or dizziness.  You are vomiting or having diarrhea for more than 2 days.  You faint. MAKE SURE YOU:   Understand these instructions.  Will watch your condition.  Will get help right away if you are not doing well or get worse. Document Released: 12/24/2004 Document Revised: 10/14/2012 Document Reviewed: 06/26/2012 [facility] Patient Information 2015 [place], [place]. This information is not intended to replace advice given to you by your health care provider. Make sure you discuss any questions you have with your health care provider.   Emergency Department Resource Guide 1) Find a Doctor and Pay Out of Pocket Although you won't have to find out who is covered by your insurance plan, it is a good idea to ask around and get recommendations. You will then need to call the office and see if the doctor you have chosen will accept you as a new patient and what types of options they offer for patients who are self-pay. Some doctors offer discounts or will set up payment plans for their patients who do not have insurance, but you will need to ask so you aren't surprised when you get to your appointment.  2) Contact Your Local Health Department Not all health departments have doctors that can see patients for sick visits, but many do, so it  is worth a call to see if yours does. If you don't know where your local health department is, you can check in your phone book. The CDC also has a tool to help you locate your state's health department, and many state websites also have listings of all of their local health departments.  3) Find a  Walk-in Clinic If your illness is not likely to be very severe or complicated, you may want to try a walk in clinic. These are popping up all over the country in pharmacies, drugstores, and shopping centers. They're usually staffed by nurse practitioners or physician assistants that have been trained to treat common illnesses and complaints. They're usually fairly quick and inexpensive. However, if you have serious medical issues or chronic medical problems, these are probably not your best option.  No Primary Care Doctor: - Call Health Connect at  [phone] - they can help you locate a primary care doctor that  accepts your insurance, provides certain services, etc. - Physician Referral Service- [phone]  Chronic Pain Problems: Organization         Address  Phone   Notes  [facility]  [phone] Patients need to be referred by their primary care doctor.   Medication Assistance: Organization         Address  Phone   Notes  [facility] Medication [facility] [address] [place] [place], [place] [address] [phone] --Must be a resident of [facility] -- Must have NO insurance coverage whatsoever (no Medicaid/ Medicare, etc.) -- The pt. MUST have a primary care doctor that directs their care regularly and follows them in the community   [facility]  [phone]   [employer]  [phone]    Agencies that provide inexpensive medical care: Organization         Address  Phone   Notes  [facility]  [phone]   [facility]    [phone]   [facility] [address] [place], [place] [address] [phone]   [facility] [address] [place], [place] [phone]   Planned Parenthood    [phone]   [facility]    [phone]   [facility]  [address], [place] Phone:  [phone], Fax:  [phone] Hours of Operation:  9 am - 6 pm, M-F.  Also accepts Medicaid/Medicare and self-pay.  [facility]  [address], [place] Phone: [phone], Fax: [phone]. Hours of Operation:  8:30 am - 5:30 pm, M-F.  Also accepts Medicaid and self-pay.  [facility] [place] [address], [place] Phone: [phone]   [facility] [address] Natasha Bence [place], [place] [phone], Ext. [phone] Mondays & Thursdays: 7-9 AM.  First 15 patients are seen on a first come, first serve basis.    Medicaid-accepting [facility] Providers:  Organization         Address  Phone   Notes  [facility] [address],  [phone] Also accepts self-pay patients.  [facility] [address] Laurell Josephs [place], [place]  [phone]   [facility] [address], [place] [phone]   [facility] [address] [place] [phone]   Renaye Rakers [address]  [address], [place]   [phone]) [phone] Only accepts [place] Access Medicaid patients after they have their name applied to their card.   Self-Pay (no insurance) in [facility]:  Organization         Address  Phone   Notes  Sickle Cell Patients, [facility] [address], [place] [phone]   [facility] [address], [place] [phone]) [phone]   [facility]  [address], [place] [phone]) [phone]   [facility]/Dr. Osei-Bonsu  [address] [place] [address], [place] or [phone], [place] [phone]) [phone] Phone number for both [place] and [place] locations is the same.  [facility] [address], [place] [phone]) [phone]   [facility] [place] [address] [place] [address], [place] or [address] [phone]) [phone]) [phone]     [facility] [address], [place] [phone](336) [phone], phone; [phone](336) [phone], fax Sees patients 1st and 3rd Saturday of every month.  Must not qualify for public or private insurance (i.e. Medicaid, Medicare, [place] Health Choice, Veterans' Benefits)  Household income should be no more than 200% of the poverty level The clinic cannot treat you if you are pregnant or think you are pregnant  Sexually transmitted diseases are not treated at the clinic.    Dental Care: Organization         Address  Phone  Notes  [facility] [address], [place] [phone]) [phone] Accepts children up to age 35 who are enrolled in [place] or [place] Health Choice; pregnant women with a Medicaid card; and children who have applied for Medicaid or [place] Health Choice, but were declined, whose parents can pay a reduced fee at time of service.  [facility] [place]  [address], [place] [phone]) [phone] Accepts children up to age 35 who are enrolled in [place] or [place] Health Choice; pregnant women with a Medicaid card; and children who have applied for Medicaid or [place] Health Choice, but were declined, whose parents can pay a reduced fee at time of service.  [facility]  [address], [place] [phone]) [phone] Patients are seen by appointment only. Walk-ins are not accepted. [facility] will see patients 35 years of age and older. Monday - Tuesday (8am-5pm) Most Wednesdays (8:30-5pm) $30 per visit, cash only  [facility]  [address], [facility] [phone]) [phone] Patients are seen by appointment only. Walk-ins are not accepted. [facility] will see patients 35 years of age and older. One Wednesday Evening (Monthly: Volunteer Based).  $30 per visit, cash only  [employer] [facility]  [phone] for adults; Children under age 244, call  Graduate Pediatric Dentistry at [phone]. Children aged 564-14, please call [phone] to request a pediatric application.  Dental services are provided in all areas of dental care including fillings, crowns and bridges, complete and partial dentures, implants, gum treatment, root canals, and extractions. Preventive care is also provided. Treatment is provided to both adults and children. Patients are selected via a lottery and there is often a waiting list.   [facility] [address], [place]  [phone]) [phone] [email or web address]   [facility] [address], [place], [place] [phone], Ext. [phone] Second and Fourth  Thursday of each month, opens at 6:30 AM; Clinic ends at 9 AM.  Patients are seen on a first-come first-served basis, and a limited number are seen during each clinic.   [facility]  [address] Ether [place], [place] [phone]   Eligibility Requirements You must have lived in [place], [place], or [place] counties for at least the last three months.   You cannot be eligible for state or federal sponsored [place], including [employer], [place], or [employer].   You generally cannot be eligible for healthcare insurance through your employer.    How to apply: Eligibility screenings are held every Tuesday and Wednesday afternoon from 1:00 pm until 4:00 pm. You do not need an appointment for the interview!  [facility] [address] [place], [place] [phone]   [facility] Health Department  [phone]   [facility] Health Department  [phone]   [facility] Health Department  [phone]    Behavioral Health Resources in the Community: Intensive Outpatient Programs Organization         Address  Phone  Notes  [facility] [address] [place], [place] [phone]   [facility] [address], [place], [place]  [phone]   [facility] [address] [place], [place]  [phone]   [facility] [address]  [place], [place] [phone] or [phone]   Substance Abuse Resources Organization         Address  Phone  Notes  [facility]  [phone]   [facility]  [phone]   [facility]  [phone]   Floydene Flock  [phone]   Residential & Outpatient Substance Abuse Program  [phone]   Psychological Services Organization         Address  Phone  Notes  [facility]  [phone]   [facility]  [phone]   [facility] [address], [place] [phone] or [phone]    Mobile Crisis Teams Organization         Address  Phone  Notes  [facility], Mobile Crisis Care Unit  [phone]   [facility]  [address] [place], [place] [phone]   Doristine Locks [address] [place] [phone]    Self-Help/Support Groups Organization         Address  Phone             Notes  [facility] [place] - variety of support groups  336- I7437963 Call for more information  Narcotics Anonymous (NA), [facility] [address], [employer] [place]  2 meetings at this location   Statistician         Address  Phone  Notes  [facility] [address],    [place]  [phone]   [facility]  [address] [place] [address], [place], [place] [phone]   [facility] [address] [place], [place] [phone] Admissions: 8am-3pm M-F  [facility] [address]    [place], [place] [phone]   [facility] [address] Starling Manns [place], [place] [phone]   [facility] [address]  [place], [place] [phone]   [facility] - Intensive Outpatient [address] Laurell Josephs [address], [place], [place] [phone]   [facility] ([facility] [address]  [place], [place] [phone] or [phone]   [facility] ([facility]) [address] [place], [place] [phone] Accepts Medicaid  [facility] [address]  [place] [phone] Substance Abuse/Addiction Treatment   [facility] Behavioral Health Resources Organization         Address  Phone  Notes  [facility]  [phone]) [phone]   Angie FavaJulie Brannon, PhD [address], [address] [place], [place]   [phone]) [phone] or [phone](336) [phone]   [facility]   [address] [place], [place] [phone](336) [phone]   [facility] [address], [place], [place] [phone]) [phone] Insurance/Medicaid/sponsorship through [facility]  [facility] [address]                                    [place], [place] [phone]) [phone] Therapy/tele-psych/case  [facility] [address]   [place], [place] [phone](336) [phone]    Dr. Lolly MustacheArfeen  [phone]) [phone]   [facility] [place]  [employer] [facility] 1) [address], [place] 2) [address], [place] 3)  [address], [place] [phone](336) [phone]) [phone]  [phone](336) [phone]   [facility] Child Abuse Hotline [phone]) [phone] or [phone](336) [phone] (After Hours)

## 2013-12-25 NOTE — ED Provider Notes (Signed)
CSN: [id number]     Arrival date & time 12/25/13  1539 History  This chart was scribed for Toy CookeyMegan Aniyla Harling, MD by Freida Busmaniana Omoyeni, ED Scribe. This patient was seen in room MH11/MH11 and the patient's care was started 5:07 PM.    Chief Complaint  Patient presents with  . Sciatica    Patient is a 33 y.o. female presenting with leg pain. The history is provided by the patient. No language interpreter was used.  Leg Pain Location:  Leg Leg location:  L lower leg and R lower leg Pain details:    Quality:  Sharp   Radiates to:  Does not radiate   Severity:  Moderate   Onset quality:  Sudden   Timing:  Constant   Progression:  Unchanged Chronicity:  New Prior injury to area:  No Associated symptoms: back pain   Associated symptoms: no fever, no muscle weakness, no numbness and no tingling      HPI Comments:  Laurie Wade is a 33 y.o. female with a h/o chronic back pain and sciatica who presents to the Emergency Department complaining of sudden onset moderate BLE pain since about 2100 last night. She describes the pain as sharp and constant. Pt states she was getting ready for work when symptom started. She notes pain today is dissimilar to past pain felt with sciatica in that the pain when diagnosed with sciatica began in her back and radiated down her lower extremities. While she notes mild lower back pain at this time, she does not believe it is associated with her leg pain. She has taken ibuprofen and used bengay with minimal relief. Her pain is exacerbated when she lifts her legs and when she ambulates. She denies numbness, weakness, urinary/bowel incontinence, dysuria and hematuria. She also denies acute injury/fall and h/o back surgery.    Past Medical History  Diagnosis Date  . Hypertension   . Chronic systolic heart failure 05/2011    a. NICM b. 5/13 LHC: nl cors c. 7/14 ECHO: EF 35-40% diff HK, MV calcified annulus, LA mild dil, MV triv regur, TV triv regur  . Systolic congestive  heart failure   . Anemia   . Left bundle branch block   . Cardiomyopathy   . Iron deficiency anemia   . 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 Headache   . Non-compliance   . Acute on chronic systolic heart failure    Past Surgical History  Procedure Laterality Date  . Cesarean section    . Cholecystectomy    . Tubal ligation    . Left and right heart catheterization with coronary angiogram N/A 06/10/2011    Procedure: LEFT AND RIGHT HEART CATHETERIZATION WITH CORONARY ANGIOGRAM;  Surgeon: Dolores Pattyaniel R Bensimhon, MD;  [facility];  Service: Cardiovascular;  Laterality: N/A;   Family History  Problem Relation Age of Onset  . Heart failure Mother   . Hypertension Mother    History  Substance Use Topics  . Smoking status: Former Games developermoker  . Smokeless tobacco: Former NeurosurgeonUser    Quit date: 06/06/2006  . Alcohol Use: No   OB History    No data available     Review of Systems  Constitutional: Negative for fever and chills.  Gastrointestinal: Negative for nausea and vomiting.  Genitourinary: Negative for dysuria and hematuria.  Musculoskeletal: Positive for myalgias and back pain.  All other systems reviewed and are negative.     Allergies  Doxycycline  Home Medications  Prior to Admission medications   Medication Sig Start Date End Date Taking? Authorizing Provider  carvedilol (COREG) 12.5 MG tablet Take 12.5 mg by mouth 2 (two) times daily with a meal.    Historical Provider, MD  cyclobenzaprine (FLEXERIL) 10 MG tablet Take 1 tablet (10 mg total) by mouth 3 (three) times daily as needed for muscle spasms. 10/19/13   Mercedes Strupp Camprubi-Soms, PA-C  diazepam (VALIUM) 5 MG tablet Take 1 tablet (5 mg total) by mouth 2 (two) times daily. Patient not taking: Reported on 11/30/2013 11/03/13   Kathrynn Speed, PA-C  diphenhydrAMINE (BENADRYL) 25 mg capsule Take 25-50 mg by mouth every 6 (six) hours as needed for allergies (allergies).      Historical Provider, MD  furosemide (LASIX) 40 MG tablet Take 1 tablet (40 mg total) by mouth daily. 07/26/13   Dwana Melena, PA-C  HYDROcodone-acetaminophen (NORCO) 5-325 MG per tablet Take 1 tablet by mouth every 4 (four) hours as needed. 12/25/13   Toy Cookey, MD  ibuprofen (ADVIL,MOTRIN) 600 MG tablet Take 1 tablet (600 mg total) by mouth every 6 (six) hours as needed. 12/25/13   Toy Cookey, MD  lisinopril (PRINIVIL,ZESTRIL) 20 MG tablet Take 20 mg by mouth at bedtime.     Historical Provider, MD  naproxen (NAPROSYN) 500 MG tablet Take 1 tablet (500 mg total) by mouth 2 (two) times daily. 11/30/13   Sharlene Motts, PA-C  spironolactone (ALDACTONE) 25 MG tablet Take 25 mg by mouth at bedtime.    Historical Provider, MD  traMADol (ULTRAM) 50 MG tablet Take 1 tablet (50 mg total) by mouth every 6 (six) hours as needed. Patient not taking: Reported on 11/30/2013 11/03/13   Robyn M Hess, PA-C   BP 169/106 mmHg  Pulse 78  Temp(Src) 98.1 F (36.7 C) (Oral)  Resp 18  Ht 5\' 6"  (1.676 m)  Wt 200 lb (90.719 kg)  BMI 32.30 kg/m2  SpO2 98%  LMP 11/16/2013 (Exact Date) Physical Exam  Constitutional: She is oriented to person, place, and time. She appears well-developed and well-nourished. No distress.  HENT:  Head: Normocephalic.  Mouth/Throat: Oropharynx is clear and moist.  Eyes: Pupils are equal, round, and reactive to light.  Neck: Neck supple.  Cardiovascular: Normal rate, regular rhythm and normal heart sounds.   Pulmonary/Chest: Effort normal and breath sounds normal. No respiratory distress. She has no wheezes.  Abdominal: Soft. She exhibits no distension. There is no tenderness. There is no rebound and no guarding.  Musculoskeletal: She exhibits tenderness. She exhibits no edema.  Generalized muscular tenderness without overlying skin changes or edema  Negative straight leg raise    Neurological: She is alert and oriented to person, place, and time. She displays no  atrophy and no tremor. No cranial nerve deficit or sensory deficit. She exhibits normal muscle tone. She displays no seizure activity. Coordination and gait normal.  Skin: Skin is warm and dry.  Psychiatric: She has a normal mood and affect.  Nursing note and vitals reviewed.   ED Course  Procedures   DIAGNOSTIC STUDIES:  Oxygen Saturation is 98% on RA, normal by my interpretation.    COORDINATION OF CARE:  5:19 PM Discussed treatment plan with pt at bedside and pt agreed to plan.  Labs Review Labs Reviewed  URINALYSIS, ROUTINE W REFLEX MICROSCOPIC - Abnormal; Notable for the following:    APPearance CLOUDY (*)    All other components within normal limits  BASIC METABOLIC PANEL - Abnormal; Notable for the  following:    Potassium 3.5 (*)    Glucose, Bld 114 (*)    All other components within normal limits  PREGNANCY, URINE    Imaging Review No results found.   EKG Interpretation None      MDM   Final diagnoses:  Leg pain, bilateral  Hypokalemia    Pt is a 33 y.o. female with Pmhx as above who presents with bilateral leg pain, right greater than left, for one day without known injury.  No overlying skin changes.  She's a history of sciatica, but is not having significant back pain today.  She's never reproducible back pain.  Straight leg raise is negative.  However, she is tender over generalized musculature of bilateral legs.  She is neurovascularly intact distally and is ambulated in the department.  We'll check lytes with an BMP as well as UA and urine pregnancy test.  She has no evidence of cauda equina, cord compression or epideral abscess.   K mildly low.  He was replaced by mouth.  UA normal.  Pain may be due to prodromal acute viral illness.  We'll discharge home.  Short course of narcotics to be alternated with NSAIDs for pain.  Patient is also not taken her antihypertensives today.  She's been given outpatient follow-up instructions as well as resource  guide   Laurie Wade evaluation in the Emergency Department is complete. It has been determined that no acute conditions requiring further emergency intervention are present at this time. The patient/guardian have been advised of the diagnosis and plan. We have discussed signs and symptoms that warrant return to the ED, such as changes or worsening in symptoms, worsening pain, redness, fever, red streaking, chest pain, shortness of breath.     I personally performed the services described in this documentation, which was scribed in my presence. The recorded information has been reviewed and is accurate.      Toy CookeyMegan Kenton Fortin, MD 12/25/13 [phone]

## 2014-01-03 ENCOUNTER — Emergency Department (HOSPITAL_COMMUNITY): Payer: Medicaid Other

## 2014-01-03 ENCOUNTER — Encounter (HOSPITAL_COMMUNITY): Payer: Self-pay | Admitting: Emergency Medicine

## 2014-01-03 ENCOUNTER — Emergency Department (HOSPITAL_COMMUNITY)
Admission: EM | Admit: 2014-01-03 | Discharge: 2014-01-03 | Disposition: A | Discharge status: Home / Self Care | Payer: Medicaid Other | Attending: Emergency Medicine | Admitting: Emergency Medicine

## 2014-01-03 DIAGNOSIS — J209 Acute bronchitis, unspecified: Secondary | ICD-10-CM | POA: Insufficient documentation

## 2014-01-03 DIAGNOSIS — R059 Cough, unspecified: Secondary | ICD-10-CM

## 2014-01-03 DIAGNOSIS — Z79899 Other long term (current) drug therapy: Secondary | ICD-10-CM | POA: Insufficient documentation

## 2014-01-03 DIAGNOSIS — Z87891 Personal history of nicotine dependence: Secondary | ICD-10-CM | POA: Insufficient documentation

## 2014-01-03 DIAGNOSIS — Z791 Long term (current) use of non-steroidal anti-inflammatories (NSAID): Secondary | ICD-10-CM | POA: Insufficient documentation

## 2014-01-03 DIAGNOSIS — I10 Essential (primary) hypertension: Secondary | ICD-10-CM | POA: Insufficient documentation

## 2014-01-03 DIAGNOSIS — Z9119 Patient's noncompliance with other medical treatment and regimen: Secondary | ICD-10-CM | POA: Insufficient documentation

## 2014-01-03 DIAGNOSIS — Z862 Personal history of diseases of the blood and blood-forming organs and certain disorders involving the immune mechanism: Secondary | ICD-10-CM | POA: Insufficient documentation

## 2014-01-03 DIAGNOSIS — R05 Cough: Secondary | ICD-10-CM

## 2014-01-03 DIAGNOSIS — I5023 Acute on chronic systolic (congestive) heart failure: Secondary | ICD-10-CM | POA: Insufficient documentation

## 2014-01-03 MED ORDER — HYDROCOD POLST-CHLORPHEN POLST 10-8 MG/5ML PO LQCR
5.0000 mL | Freq: Once | ORAL | Status: AC
  Administered 2014-01-03: 5 mL via ORAL
  Filled 2014-01-03: qty 5

## 2014-01-03 MED ORDER — NAPROXEN 500 MG PO TABS
500.0000 mg | ORAL_TABLET | Freq: Once | ORAL | Status: AC
  Administered 2014-01-03: 500 mg via ORAL
  Filled 2014-01-03: qty 1

## 2014-01-03 MED ORDER — HYDROCOD POLST-CHLORPHEN POLST 10-8 MG/5ML PO LQCR: 5.0000 mL | Freq: Two times a day (BID) | ORAL | Status: DC | PRN

## 2014-01-03 MED ORDER — ALBUTEROL SULFATE HFA 108 (90 BASE) MCG/ACT IN AERS
2.0000 | INHALATION_SPRAY | RESPIRATORY_TRACT | Status: DC | PRN
  Administered 2014-01-03: 2 via RESPIRATORY_TRACT
  Filled 2014-01-03: qty 6.7

## 2014-01-03 MED ORDER — IPRATROPIUM-ALBUTEROL 0.5-2.5 (3) MG/3ML IN SOLN
3.0000 mL | RESPIRATORY_TRACT | Status: DC
  Administered 2014-01-03: 3 mL via RESPIRATORY_TRACT
  Filled 2014-01-03: qty 3

## 2014-01-03 NOTE — ED Provider Notes (Signed)
CSN: [id number]     Arrival date & time 01/03/14  [id number] History   First MD Initiated Contact with Patient 01/03/14 [phone]     Chief Complaint  Patient presents with  . Cough     (Consider location/radiation/quality/duration/timing/severity/associated sxs/prior Treatment) HPI  This is a 33 year old female with a 2 to three-day history of cough, shortness of breath, nasal congestion, body aches, general malaise and chest soreness. She has had subjective fevers as well. She is here primarily complaining of severe cough and a sensation that she cannot take a deep breath without inducing a paroxysm of coughing. She has been taking NyQuil and TheraFlu without relief. She has a known history of a chronic left bundle branch block.  Past Medical History  Diagnosis Date  . Hypertension   . Chronic systolic heart failure 05/2011    a. NICM b. 5/13 LHC: nl cors c. 7/14 ECHO: EF 35-40% diff HK, MV calcified annulus, LA mild dil, MV triv regur, TV triv regur  . Systolic congestive heart failure   . Anemia   . Left bundle branch block   . Cardiomyopathy   . Iron deficiency anemia   . 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 Headache   . Non-compliance   . Acute on chronic systolic heart failure    Past Surgical History  Procedure Laterality Date  . Cesarean section    . Cholecystectomy    . Tubal ligation    . Left and right heart catheterization with coronary angiogram N/A 06/10/2011    Procedure: LEFT AND RIGHT HEART CATHETERIZATION WITH CORONARY ANGIOGRAM;  Surgeon: Dolores Pattyaniel R Bensimhon, MD;  [facility];  Service: Cardiovascular;  Laterality: N/A;   Family History  Problem Relation Age of Onset  . Heart failure Mother   . Hypertension Mother    History  Substance Use Topics  . Smoking status: Former Games developermoker  . Smokeless tobacco: Former NeurosurgeonUser    Quit date: 06/06/2006  . Alcohol Use: No   OB History    No data available     Review of Systems  All  other systems reviewed and are negative.  Allergies  Doxycycline  Home Medications   Prior to Admission medications   Medication Sig Start Date End Date Taking? Authorizing Provider  carvedilol (COREG) 12.5 MG tablet Take 12.5 mg by mouth 2 (two) times daily with a meal.   Yes Historical Provider, MD  diphenhydrAMINE (BENADRYL) 25 mg capsule Take 25-50 mg by mouth every 6 (six) hours as needed for allergies (allergies).    Yes Historical Provider, MD  furosemide (LASIX) 40 MG tablet Take 1 tablet (40 mg total) by mouth daily. 07/26/13  Yes Dwana MelenaBryan W Hager, PA-C  HYDROcodone-acetaminophen (NORCO) 5-325 MG per tablet Take 1 tablet by mouth every 4 (four) hours as needed. 12/25/13  Yes Toy CookeyMegan Docherty, MD  ibuprofen (ADVIL,MOTRIN) 600 MG tablet Take 1 tablet (600 mg total) by mouth every 6 (six) hours as needed. Patient taking differently: Take 600 mg by mouth every 6 (six) hours as needed for moderate pain.  12/25/13  Yes Toy CookeyMegan Docherty, MD  lisinopril (PRINIVIL,ZESTRIL) 20 MG tablet Take 20 mg by mouth at bedtime.    Yes Historical Provider, MD  naproxen (NAPROSYN) 500 MG tablet Take 1 tablet (500 mg total) by mouth 2 (two) times daily. 11/30/13  Yes Earle GellBenjamin W Cartner, PA-C  spironolactone (ALDACTONE) 25 MG tablet Take 25 mg by mouth at bedtime.   Yes Historical Provider, MD  cyclobenzaprine (FLEXERIL) 10 MG tablet Take 1 tablet (10 mg total) by mouth 3 (three) times daily as needed for muscle spasms. 10/19/13   Mercedes Strupp Camprubi-Soms, PA-C  diazepam (VALIUM) 5 MG tablet Take 1 tablet (5 mg total) by mouth 2 (two) times daily. Patient not taking: Reported on 11/30/2013 11/03/13   Kathrynn Speedobyn M Hess, PA-C  traMADol (ULTRAM) 50 MG tablet Take 1 tablet (50 mg total) by mouth every 6 (six) hours as needed. Patient not taking: Reported on 11/30/2013 11/03/13   Robyn M Hess, PA-C   BP 159/103 mmHg  Pulse 86  Temp(Src) 98.3 F (36.8 C) (Oral)  Resp 20  Ht 5\' 6"  (1.676 m)  Wt 200 lb (90.719 kg)   BMI 32.30 kg/m2  SpO2 98%  LMP 12/26/2013   Physical Exam  General: Well-developed, well-nourished female in no acute distress; appearance consistent with age of record HENT: normocephalic; atraumatic Eyes: pupils equal, round and reactive to light; extraocular muscles intact Neck: supple Heart: regular rate and rhythm; no murmurs, rubs or gallops Lungs: clear to auscultation bilaterally; persistent coughing, especially on attempted deep breaths Abdomen: soft; nondistended; nontender; bowel sounds present Extremities: No deformity; full range of motion; pulses normal Neurologic: Awake, alert and oriented; motor function intact in all extremities and symmetric; no facial droop Skin: Warm and dry Psychiatric: Flat affect  ED Course  Procedures (including critical care time)   EKG Interpretation   Date/Time:  Monday January 03 2014 03:17:09 EST Ventricular Rate:  92 PR Interval:  170 QRS Duration: 180 QT Interval:  442 QTC Calculation: 547 R Axis:   78 Text Interpretation:  Sinus rhythm Biatrial enlargement IVCD, consider  atypical LBBB No significant change was found Confirmed by Bobi Daudelin  MD,  Jonny RuizJOHN ([id number]) on 01/03/2014 4:26:13 AM      MDM  Nursing notes and vitals signs, including pulse oximetry, reviewed.  Summary of this visit's results, reviewed by myself:  Imaging Studies: Dg Chest 2 View  01/03/2014   CLINICAL DATA:  Cough and shortness of breath.  EXAM: CHEST  2 VIEW  COMPARISON:  09/03/2013  FINDINGS: Mild cardiomegaly which is stable from prior. Aortic and hilar contours are negative. There is no edema, consolidation, effusion, or pneumothorax.  IMPRESSION: Stable mild cardiomegaly.  No pulmonary edema or pneumonia.   Electronically Signed   By: Tiburcio PeaJonathan  Watts M.D.   On: 01/03/2014 06:05   6:21 AM Air movement improved after DuoNeb treatment. There is no indication for antibiotics at this time. We will provide her albuterol inhaler and a cough  suppressant.    Hanley SeamenJohn L Destyn Parfitt, MD 01/03/14 [phone]

## 2014-01-03 NOTE — ED Notes (Signed)
Notified RT that patient is ready for scan.

## 2014-01-03 NOTE — Discharge Instructions (Signed)

## 2014-01-03 NOTE — ED Notes (Signed)
Pt reports that she has been having a cough, CP and shortness of breath since last Sunday. Pt states that she has been taking Nyquil and Theraflu for symptoms without relief. Pt noted to be hypertensive with hx of same.

## 2014-01-10 ENCOUNTER — Encounter (HOSPITAL_COMMUNITY): Payer: Self-pay | Admitting: Emergency Medicine

## 2014-01-10 ENCOUNTER — Emergency Department (HOSPITAL_COMMUNITY)
Admission: EM | Admit: 2014-01-10 | Discharge: 2014-01-10 | Disposition: A | Discharge status: Home / Self Care | Payer: Medicaid Other | Attending: Emergency Medicine | Admitting: Emergency Medicine

## 2014-01-10 ENCOUNTER — Emergency Department (HOSPITAL_COMMUNITY): Payer: Medicaid Other

## 2014-01-10 DIAGNOSIS — R079 Chest pain, unspecified: Secondary | ICD-10-CM

## 2014-01-10 DIAGNOSIS — Z8673 Personal history of transient ischemic attack (TIA), and cerebral infarction without residual deficits: Secondary | ICD-10-CM | POA: Insufficient documentation

## 2014-01-10 DIAGNOSIS — I5022 Chronic systolic (congestive) heart failure: Secondary | ICD-10-CM | POA: Insufficient documentation

## 2014-01-10 DIAGNOSIS — Z79899 Other long term (current) drug therapy: Secondary | ICD-10-CM | POA: Insufficient documentation

## 2014-01-10 DIAGNOSIS — Z862 Personal history of diseases of the blood and blood-forming organs and certain disorders involving the immune mechanism: Secondary | ICD-10-CM | POA: Insufficient documentation

## 2014-01-10 DIAGNOSIS — I110 Hypertensive heart disease with heart failure: Secondary | ICD-10-CM | POA: Insufficient documentation

## 2014-01-10 DIAGNOSIS — I10 Essential (primary) hypertension: Secondary | ICD-10-CM

## 2014-01-10 DIAGNOSIS — Z87891 Personal history of nicotine dependence: Secondary | ICD-10-CM | POA: Insufficient documentation

## 2014-01-10 DIAGNOSIS — Z9889 Other specified postprocedural states: Secondary | ICD-10-CM | POA: Insufficient documentation

## 2014-01-10 DIAGNOSIS — R0789 Other chest pain: Secondary | ICD-10-CM

## 2014-01-10 LAB — CBC WITH DIFFERENTIAL/PLATELET
BASOS PCT: 0 % (ref 0–1)
Basophils Absolute: 0 10*3/uL (ref 0.0–0.1)
Eosinophils Absolute: 0 10*3/uL (ref 0.0–0.7)
Eosinophils Relative: 0 % (ref 0–5)
HEMATOCRIT: 34.8 % — AB (ref 36.0–46.0)
Hemoglobin: 11.2 g/dL — ABNORMAL LOW (ref 12.0–15.0)
LYMPHS ABS: 1.8 10*3/uL (ref 0.7–4.0)
LYMPHS PCT: 22 % (ref 12–46)
MCH: 28.5 pg (ref 26.0–34.0)
MCHC: 32.2 g/dL (ref 30.0–36.0)
MCV: 88.5 fL (ref 78.0–100.0)
MONOS PCT: 4 % (ref 3–12)
Monocytes Absolute: 0.3 10*3/uL (ref 0.1–1.0)
NEUTROS ABS: 6 10*3/uL (ref 1.7–7.7)
NEUTROS PCT: 74 % (ref 43–77)
Platelets: 235 10*3/uL (ref 150–400)
RBC: 3.93 MIL/uL (ref 3.87–5.11)
RDW: 15.9 % — ABNORMAL HIGH (ref 11.5–15.5)
WBC: 8.2 10*3/uL (ref 4.0–10.5)

## 2014-01-10 LAB — BASIC METABOLIC PANEL
ANION GAP: 8 (ref 5–15)
BUN: 6 mg/dL (ref 6–23)
CO2: 25 mmol/L (ref 19–32)
Calcium: 9.5 mg/dL (ref 8.4–10.5)
Chloride: 108 mEq/L (ref 96–112)
Creatinine, Ser: 0.73 mg/dL (ref 0.50–1.10)
GFR calc Af Amer: >90 mL/min (ref >90)
GFR calc non Af Amer: >90 mL/min (ref >90)
Glucose, Bld: 95 mg/dL (ref 70–99)
Potassium: 3.6 mmol/L (ref 3.5–5.1)
Sodium: 141 mmol/L (ref 135–145)

## 2014-01-10 LAB — I-STAT TROPONIN, ED: Troponin i, poc: 0.04 ng/mL (ref 0.00–0.08)

## 2014-01-10 MED ORDER — TRAMADOL HCL 50 MG PO TABS: 50.0000 mg | ORAL_TABLET | Freq: Four times a day (QID) | ORAL | Status: DC | PRN

## 2014-01-10 MED ORDER — MORPHINE SULFATE 2 MG/ML IJ SOLN
2.0000 mg | Freq: Once | INTRAMUSCULAR | Status: AC
  Administered 2014-01-10: 2 mg via INTRAVENOUS
  Filled 2014-01-10: qty 1

## 2014-01-10 MED ORDER — MORPHINE SULFATE 4 MG/ML IJ SOLN
4.0000 mg | Freq: Once | INTRAMUSCULAR | Status: AC
  Administered 2014-01-10: 4 mg via INTRAVENOUS
  Filled 2014-01-10: qty 1

## 2014-01-10 NOTE — ED Notes (Signed)
Bed: ON62 Expected date:  Expected time:  Means of arrival:  Comments: Deep clean

## 2014-01-10 NOTE — ED Provider Notes (Signed)
CSN: [id number]     Arrival date & time 01/10/14  1478 History   First MD Initiated Contact with Patient 01/10/14 [phone]     Chief Complaint  Patient presents with  . Chest Pain   Patient is a 34 y.o. female presenting with chest pain. The history is provided by the patient. No language interpreter was used.  Chest Pain Pain location:  R chest Pain quality: sharp and stabbing   Pain radiates to:  Does not radiate Pain radiates to the back: no   Pain severity:  Severe Onset quality:  Sudden Duration:  1 day Timing:  Intermittent Progression:  Unchanged Chronicity:  New Context: breathing, lifting, movement and at rest   Relieved by:  Nothing Worsened by:  Movement and deep breathing Ineffective treatments:  None tried Associated symptoms: no abdominal pain, no altered mental status, no anorexia, no anxiety, no back pain, no claudication, no cough, no diaphoresis, no dizziness, no dysphagia, no fatigue, no fever, no headache, no heartburn, no lower extremity edema, no nausea, no near-syncope, no numbness, no orthopnea, no palpitations, no PND, no shortness of breath, no syncope, not vomiting and no weakness   Risk factors: no aortic disease, no birth control, no coronary artery disease, no diabetes mellitus, no Ehlers-Danlos syndrome, no high cholesterol, no hypertension, no immobilization, not female, no Marfan's syndrome, not obese, not pregnant, no prior DVT/PE, no smoking and no surgery    Patient does admit to heavy lifting of watermelons last night at work. Her pain started after this.  Patient does have a history of CHF and HTN which is followed by Dr. Gala Romney.  Patient has a most recent EF of 35-40% and had normal coronary arteries on a right and left heart catheterization in 2013.    Past Medical History  Diagnosis Date  . Hypertension   . Chronic systolic heart failure 05/2011    a. NICM b. 5/13 LHC: nl cors c. 7/14 ECHO: EF 35-40% diff HK, MV calcified annulus, LA mild dil, MV  triv regur, TV triv regur  . Systolic congestive heart failure   . Anemia   . Left bundle branch block   . Cardiomyopathy   . Iron deficiency anemia   . 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 Headache   . Non-compliance   . Acute on chronic systolic heart failure    Past Surgical History  Procedure Laterality Date  . Cesarean section    . Cholecystectomy    . Tubal ligation    . Left and right heart catheterization with coronary angiogram N/A 06/10/2011    Procedure: LEFT AND RIGHT HEART CATHETERIZATION WITH CORONARY ANGIOGRAM;  Surgeon: Dolores Patty, MD;  Location: [facility] C[facility] LAB;  Service: Cardiovascular;  Laterality: N/A;   Family History  Problem Relation Age of Onset  . Heart failure Mother   . Hypertension Mother    History  Substance Use Topics  . Smoking status: Former Games developer  . Smokeless tobacco: Former Neurosurgeon    Quit date: 06/06/2006  . Alcohol Use: No   OB History    No data available     Review of Systems  Constitutional: Negative for fever, diaphoresis and fatigue.  HENT: Negative for trouble swallowing.   Respiratory: Negative for cough and shortness of breath.   Cardiovascular: Positive for chest pain. Negative for palpitations, orthopnea, claudication, syncope, PND and near-syncope.  Gastrointestinal: Negative for heartburn, nausea, vomiting, abdominal pain and anorexia.  Musculoskeletal: Negative for back pain.  Neurological: Negative for dizziness, weakness, numbness and headaches.  All other systems reviewed and are negative.     Allergies  Doxycycline  Home Medications   Prior to Admission medications   Medication Sig Start Date End Date Taking? Authorizing Provider  carvedilol (COREG) 12.5 MG tablet Take 12.5 mg by mouth daily.    Yes Historical Provider, MD  diphenhydrAMINE (BENADRYL) 25 mg capsule Take 25-50 mg by mouth every 6 (six) hours as needed for allergies (allergies).    Yes Historical Provider, MD   furosemide (LASIX) 40 MG tablet Take 1 tablet (40 mg total) by mouth daily. 07/26/13  Yes Dwana Melena, PA-C  lisinopril (PRINIVIL,ZESTRIL) 20 MG tablet Take 20 mg by mouth daily.    Yes Historical Provider, MD  spironolactone (ALDACTONE) 25 MG tablet Take 25 mg by mouth daily.    Yes Historical Provider, MD  chlorpheniramine-HYDROcodone (TUSSIONEX PENNKINETIC ER) 10-8 MG/5ML LQCR Take 5 mLs by mouth every 12 (twelve) hours as needed for cough. Patient not taking: Reported on 01/10/2014 01/03/14   Carlisle Beers Molpus, MD  cyclobenzaprine (FLEXERIL) 10 MG tablet Take 1 tablet (10 mg total) by mouth 3 (three) times daily as needed for muscle spasms. Patient not taking: Reported on 01/10/2014 10/19/13   Donnita Falls Camprubi-Soms, PA-C  HYDROcodone-acetaminophen (NORCO) 5-325 MG per tablet Take 1 tablet by mouth every 4 (four) hours as needed. Patient not taking: Reported on 01/10/2014 12/25/13   Toy Cookey, MD  ibuprofen (ADVIL,MOTRIN) 600 MG tablet Take 1 tablet (600 mg total) by mouth every 6 (six) hours as needed. Patient not taking: Reported on 01/10/2014 12/25/13   Toy Cookey, MD  naproxen (NAPROSYN) 500 MG tablet Take 1 tablet (500 mg total) by mouth 2 (two) times daily. Patient not taking: Reported on 01/10/2014 11/30/13   Earle Gell Cartner, PA-C  traMADol (ULTRAM) 50 MG tablet Take 1 tablet (50 mg total) by mouth every 6 (six) hours as needed. 01/10/14   Zamira Hickam A Forcucci, PA-C   BP 158/107 mmHg  Pulse 93  Temp(Src) 98 F (36.7 C) (Oral)  Resp 18  Ht  (1.651 m)  Wt 200 lb (90.719 kg)  BMI 33.28 kg/m2  SpO2 100%  LMP 12/26/2013 Physical Exam  Constitutional: She is oriented to person, place, and time. She appears well-developed and well-nourished. No distress.  HENT:  Head: Normocephalic and atraumatic.  Mouth/Throat: Oropharynx is clear and moist. No oropharyngeal exudate.  Eyes: Conjunctivae and EOM are normal. Pupils are equal, round, and reactive to light. No scleral  icterus.  Neck: Normal range of motion. Neck supple. No JVD present. No thyromegaly present.  Cardiovascular: Normal rate, regular rhythm, normal heart sounds and intact distal pulses.  Exam reveals no gallop and no friction rub.   No murmur heard. Pulmonary/Chest: Effort normal and breath sounds normal. No respiratory distress. She has no wheezes. She has no rales. She exhibits tenderness.  Abdominal: Soft. Bowel sounds are normal. She exhibits no distension and no mass. There is no tenderness. There is no rebound and no guarding.  Musculoskeletal: Normal range of motion.  Lymphadenopathy:    She has no cervical adenopathy.  Neurological: She is alert and oriented to person, place, and time.  Skin: Skin is warm and dry. She is not diaphoretic.  Psychiatric: She has a normal mood and affect. Her behavior is normal. Judgment and thought content normal.  Nursing note and vitals reviewed.   ED Course  Procedures (including critical care time) Labs Review Labs Reviewed  CBC WITH DIFFERENTIAL - Abnormal; Notable for the following:    Hemoglobin 11.2 (*)    HCT 34.8 (*)    RDW 15.9 (*)    All other components within normal limits  BASIC METABOLIC PANEL  Rosezena Sensor, ED    Imaging Review Dg Chest 2 View  01/10/2014   CLINICAL DATA:  New mid chest pain since last night.  Cough.  EXAM: CHEST  2 VIEW  COMPARISON:  01/03/2014  FINDINGS: The heart is enlarged but stable in appearance. Minimal left lower lobe atelectasis. No evidence for pulmonary edema. There are no focal consolidations or pleural effusions. Surgical clips are noted in the upper abdomen.  IMPRESSION: 1. Stable cardiomegaly. 2. Subsegmental left lower lobe atelectasis.   Electronically Signed   By: Rosalie Gums M.D.   On: 01/10/2014 08:09     EKG Interpretation   Date/Time:  Monday January 10 2014 06:56:16 EST Ventricular Rate:  97 PR Interval:  156 QRS Duration: 172 QT Interval:  409 QTC Calculation: 520 R Axis:    56 Text Interpretation:  Sinus rhythm Probable left atrial enlargement Left  bundle branch block Baseline wander in lead(s) II aVR No significant  change was found Confirmed by CAMPOS  MD, Caryn Bee ([id number]) on 01/10/2014  7:00:56 AM      MDM   Final diagnoses:  Chest pain  Chest wall pain  Essential hypertension   Patient is a 34 year old female who presents emergency room for evaluation of sharp intermittent chest pain. Physical exam reveals tenderness palpation of the chest which reproduces pain. EKG is unchanged from prior. Patient remains with left bundle-branch block. Chest x-ray is negative for acute infiltrates, pulmonary edema, or pneumothoraces. Patient is PERC negative. Doubt PE at this time. I-STAT troponin is negative. CBC reveals minor anemia. BMP is unremarkable. Doubt ACS at this time. Suspect that this is likely musculoskeletal in nature. Patient did have normal coronary arteries on heart catheterization 2013 which is reassuring.  Will discharge home with ultram and will have the patient follow-up with her PCP Dr. Mikeal Hawthorne.  Patient is to return for worsening shortness of breath, ACS symptoms, or any other concerning symptoms.  Patient states understanding and agreement at this time.  Patient discussed with Dr. Juleen China who agrees with the above workup and plan.  Patient is stable for discharge.      Eben Burow, PA-C 01/10/14 6045  Raeford Razor, MD 01/11/14 [phone]

## 2014-01-10 NOTE — ED Notes (Signed)
Pt sts chest pain is central and right sided, she reports lifting heavy watemelons at work repetitively and thinks that's what's caused the pain.

## 2014-01-10 NOTE — ED Notes (Signed)
Pt arrived to the ED with a complaint of chest pain.  Pt states the pain began last night around 2300 hrs.  Pt states the pain is intermittently sharp.  Pt states it prevented her from sleeping.  Pt states that the pain is different than the pain associated with the bronchitis is she experienced last week.  Pt denies shortness of breath.

## 2014-01-10 NOTE — Discharge Instructions (Signed)
Chest Wall Pain Chest wall pain is pain in or around the bones and muscles of your chest. It may take up to 6 weeks to get better. It may take longer if you must stay physically active in your work and activities.  CAUSES  Chest wall pain may happen on its own. However, it may be caused by:  A viral illness like the flu.  Injury.  Coughing.  Exercise.  Arthritis.  Fibromyalgia.  Shingles. HOME CARE INSTRUCTIONS   Avoid overtiring physical activity. Try not to strain or perform activities that cause pain. This includes any activities using your chest or your abdominal and side muscles, especially if heavy weights are used.  Put ice on the sore area.  Put ice in a plastic bag.  Place a towel between your skin and the bag.  Leave the ice on for 15-20 minutes per hour while awake for the first 2 days.  Only take over-the-counter or prescription medicines for pain, discomfort, or fever as directed by your caregiver. SEEK IMMEDIATE MEDICAL CARE IF:   Your pain increases, or you are very uncomfortable.  You have a fever.  Your chest pain becomes worse.  You have new, unexplained symptoms.  You have nausea or vomiting.  You feel sweaty or lightheaded.  You have a cough with phlegm (sputum), or you cough up blood. MAKE SURE YOU:   Understand these instructions.  Will watch your condition.  Will get help right away if you are not doing well or get worse. Document Released: 12/24/2004 Document Revised: 03/18/2011 Document Reviewed: 08/20/2010 [facility] Patient Information 2015 [place], [place]. This information is not intended to replace advice given to you by your health care provider. Make sure you discuss any questions you have with your health care provider.  Ermelinda Das

## 2014-03-04 ENCOUNTER — Encounter (HOSPITAL_COMMUNITY): Payer: Self-pay | Admitting: Emergency Medicine

## 2014-03-04 ENCOUNTER — Emergency Department (HOSPITAL_COMMUNITY): Payer: Medicaid Other

## 2014-03-04 ENCOUNTER — Emergency Department (HOSPITAL_COMMUNITY)
Admission: EM | Admit: 2014-03-04 | Discharge: 2014-03-04 | Disposition: A | Discharge status: Home / Self Care | Payer: Medicaid Other | Attending: Emergency Medicine | Admitting: Emergency Medicine

## 2014-03-04 DIAGNOSIS — Z862 Personal history of diseases of the blood and blood-forming organs and certain disorders involving the immune mechanism: Secondary | ICD-10-CM | POA: Insufficient documentation

## 2014-03-04 DIAGNOSIS — I10 Essential (primary) hypertension: Secondary | ICD-10-CM | POA: Insufficient documentation

## 2014-03-04 DIAGNOSIS — G8929 Other chronic pain: Secondary | ICD-10-CM | POA: Insufficient documentation

## 2014-03-04 DIAGNOSIS — Z79899 Other long term (current) drug therapy: Secondary | ICD-10-CM | POA: Insufficient documentation

## 2014-03-04 DIAGNOSIS — Z8739 Personal history of other diseases of the musculoskeletal system and connective tissue: Secondary | ICD-10-CM | POA: Insufficient documentation

## 2014-03-04 DIAGNOSIS — Z87891 Personal history of nicotine dependence: Secondary | ICD-10-CM | POA: Insufficient documentation

## 2014-03-04 DIAGNOSIS — R109 Unspecified abdominal pain: Secondary | ICD-10-CM | POA: Insufficient documentation

## 2014-03-04 DIAGNOSIS — Z9889 Other specified postprocedural states: Secondary | ICD-10-CM | POA: Insufficient documentation

## 2014-03-04 DIAGNOSIS — R0789 Other chest pain: Secondary | ICD-10-CM | POA: Insufficient documentation

## 2014-03-04 DIAGNOSIS — I5022 Chronic systolic (congestive) heart failure: Secondary | ICD-10-CM | POA: Insufficient documentation

## 2014-03-04 HISTORY — DX: Other chronic pain: G89.29

## 2014-03-04 HISTORY — DX: Dorsalgia, unspecified: M54.9

## 2014-03-04 HISTORY — DX: Other chest pain: R07.89

## 2014-03-04 HISTORY — DX: Sciatica, unspecified side: M54.30

## 2014-03-04 LAB — HEPATIC FUNCTION PANEL
ALBUMIN: 4.3 g/dL (ref 3.5–5.2)
ALK PHOS: 54 U/L (ref 39–117)
ALT: 28 U/L (ref 0–35)
AST: 30 U/L (ref 0–37)
BILIRUBIN TOTAL: 0.3 mg/dL (ref 0.3–1.2)
Bilirubin, Direct: <0.1 mg/dL (ref 0.0–0.5)
Total Protein: 7.2 g/dL (ref 6.0–8.3)

## 2014-03-04 LAB — BASIC METABOLIC PANEL
Anion gap: 9 (ref 5–15)
BUN: 16 mg/dL (ref 6–23)
CO2: 26 mmol/L (ref 19–32)
Calcium: 9.2 mg/dL (ref 8.4–10.5)
Chloride: 106 mmol/L (ref 96–112)
Creatinine, Ser: 1.02 mg/dL (ref 0.50–1.10)
GFR calc Af Amer: 82 mL/min — ABNORMAL LOW (ref >90)
GFR calc non Af Amer: 70 mL/min — ABNORMAL LOW (ref >90)
GLUCOSE: 101 mg/dL — AB (ref 70–99)
POTASSIUM: 3.6 mmol/L (ref 3.5–5.1)
SODIUM: 141 mmol/L (ref 135–145)

## 2014-03-04 LAB — I-STAT TROPONIN, ED: TROPONIN I, POC: 0.01 ng/mL (ref 0.00–0.08)

## 2014-03-04 LAB — CBC
HEMATOCRIT: 33.6 % — AB (ref 36.0–46.0)
Hemoglobin: 10.5 g/dL — ABNORMAL LOW (ref 12.0–15.0)
MCH: 29.2 pg (ref 26.0–34.0)
MCHC: 31.3 g/dL (ref 30.0–36.0)
MCV: 93.6 fL (ref 78.0–100.0)
Platelets: 217 10*3/uL (ref 150–400)
RBC: 3.59 MIL/uL — AB (ref 3.87–5.11)
RDW: 16.1 % — ABNORMAL HIGH (ref 11.5–15.5)
WBC: 7.9 10*3/uL (ref 4.0–10.5)

## 2014-03-04 LAB — LIPASE, BLOOD: Lipase: 42 U/L (ref 11–59)

## 2014-03-04 MED ORDER — HYDROCODONE-ACETAMINOPHEN 5-325 MG PO TABS: ORAL_TABLET | ORAL | Status: DC

## 2014-03-04 MED ORDER — METHOCARBAMOL 500 MG PO TABS: 1000.0000 mg | ORAL_TABLET | Freq: Four times a day (QID) | ORAL | Status: DC | PRN

## 2014-03-04 MED ORDER — OXYCODONE-ACETAMINOPHEN 5-325 MG PO TABS
1.0000 | ORAL_TABLET | Freq: Once | ORAL | Status: AC
  Administered 2014-03-04: 1 via ORAL
  Filled 2014-03-04: qty 1

## 2014-03-04 NOTE — ED Notes (Signed)
Pt states she has been having pain in her chest and shoulders for the past 2 days  Pt states tonight about 0030 she started having pain around her diaphram area with pressure pushing up into her chest  Pt states it made it hard to breathe  Denies nausea or vomiting

## 2014-03-04 NOTE — Discharge Instructions (Signed)
°Emergency Department Resource Guide °1) Find a Doctor and Pay Out of Pocket °Although you won't have to find out who is covered by your insurance plan, it is a good idea to ask around and get recommendations. You will then need to call the office and see if the doctor you have chosen will accept you as a new patient and what types of options they offer for patients who are self-pay. Some doctors offer discounts or will set up payment plans for their patients who do not have insurance, but you will need to ask so you aren't surprised when you get to your appointment. ° °2) Contact Your Local Health Department °Not all health departments have doctors that can see patients for sick visits, but many do, so it is worth a call to see if yours does. If you don't know where your local health department is, you can check in your phone book. The CDC also has a tool to help you locate your state's health department, and many state websites also have listings of all of their local health departments. ° °3) Find a Walk-in Clinic °If your illness is not likely to be very severe or complicated, you may want to try a walk in clinic. These are popping up all over the country in pharmacies, drugstores, and shopping centers. They're usually staffed by nurse practitioners or physician assistants that have been trained to treat common illnesses and complaints. They're usually fairly quick and inexpensive. However, if you have serious medical issues or chronic medical problems, these are probably not your best option. ° °No Primary Care Doctor: °- Call Health Connect at  832-8000 - they can help you locate a primary care doctor that  accepts your insurance, provides certain services, etc. °- Physician Referral Service- 1-800-533-3463 ° °Chronic Pain Problems: °Organization         Address  Phone   Notes  °[facility]  (336) 297-2271 Patients need to be referred by their primary care doctor.  ° °Medication  Assistance: °Organization         Address  Phone   Notes  °[place] [facility] [address] °[place], [place] [address] (336) 641-8030 --Must be a resident of [place] °-- Must have NO insurance coverage whatsoever (no Medicaid/ Medicare, etc.) °-- The pt. MUST have a primary care doctor that directs their care regularly and follows them in the community °  °[facility]  (866) 331-1348   °[employer]  (888) 892-1162   ° °Agencies that provide inexpensive medical care: °Organization         Address  Phone   Notes  °[facility]  (336) 832-8035   °[facility]    (336) 832-7272   °[facility] [address] °[place], [place] [address] (336) 832-4777   °[facility] [address], °[place] (336) 271-4999   °[facility]    (336) 373-0678   °[facility]    (336) 272-1050   °[facility] ° [address], [place] Phone:  (336) 832-4444, Fax:  (336) 832-4440 Hours of Operation:  9 am - 6 pm, M-F.  Also accepts Medicaid/Medicare and self-pay.  °[facility] ° [address], [place] Phone: (336) [phone], Fax: (336) [phone]. Hours of Operation:  8:30 am - 5:30 pm, M-F.  Also accepts Medicaid and self-pay.  °[facility] [place] [address], [place] Phone: (336) 878-6027   °[facility] [address], [place], [place] (336)723-1848, Ext. 123 Mondays & Thursdays: 7-9 AM.  First 15 patients are seen on a first come, first serve basis. °  ° °Medicaid-accepting [place] Providers: ° °Organization         Address  Phone   Notes  °[facility] [address], [place] (336) 641-2100 Also accepts self-pay patients.  °[facility] [address], [place] ° (336) 856-9996   °[facility] [address], [place]  (336) 288-8857   °[facility] [address] [place] [address], [place] (336) 299-7000   °Veita Bland [address], [place]  ° (336) 373-1557 Only accepts [place] Access Medicaid patients after they have their name applied to their card.  ° °Self-Pay (no insurance) in [place]: ° °Organization         Address  Phone   Notes  °Sickle Cell Patients, [facility] [address], [place] (336) 832-1970   °[facility] [address], [place] (336) 832-4400   °[facility] ° [address], [place] (336) 992-4800   °[facility]/Dr. Osei-Bonsu ° [address] [place] [address], [place] or [address], [place] (336) [phone] Phone number for both [place] and [place] locations is the same.  °[facility] [address], [place] (336) 299-0000   °[facility] [address] [place] [address], [place] or [address] (336) 852-7530 °(336) 878-2260   °[facility] [address], [place] (336) 350-1642, phone; (336) 294-5005, fax Sees patients 1st and 3rd Saturday of every month.  Must not qualify for public or private insurance (i.e. Medicaid, Medicare, [place] Health Choice, Veterans' Benefits) • Household income should be no more than 200% of the poverty level •The clinic cannot treat you if you are pregnant or think you are pregnant • Sexually transmitted diseases are not treated at the clinic.  ° ° °Dental Care: °Organization         Address  Phone  Notes  °[place] [facility] [address], [place] (336) 641-6152 Accepts children up to age 21 who are enrolled in Medicaid or [place] Health Choice; pregnant women with a Medicaid card; and children who have applied for Medicaid or [place] Health Choice, but were declined, whose parents can pay a reduced fee at time of service.  °[place]  [facility] [place]  [address], [place] (336) 641-7733 Accepts children up to age 21 who are enrolled in Medicaid or [place] Health Choice; pregnant women with a Medicaid card; and children who have applied for Medicaid or [place] Health Choice, but were declined, whose parents can pay a reduced fee at time of service.  °[facility] ° [address], [place] (336) 641-4533 Patients are seen by appointment only. Walk-ins are not accepted. [facility] will see patients 18 years of age and older. °Monday - Tuesday (8am-5pm) °Most Wednesdays (8:30-5pm) °$30 per visit, cash only  °[facility] ° [address], [place] (336) 641-4533 Patients are seen by appointment only. Walk-ins are not accepted. [facility] will see patients 18 years of age and older. °One   Wednesday Evening (Monthly: Volunteer Based).  $30 per visit, cash only  °[facility]  (919) 537-3737 for adults; Children under age 4, call Graduate Pediatric Dentistry at ([phone]) [phone]. Children aged 4-14, please call ([phone]) [phone] to request a pediatric application. ° Dental services are provided in all areas of dental care including fillings, crowns and bridges, complete and partial dentures, implants, gum treatment, root canals, and extractions. Preventive care is also provided. Treatment is provided to both adults and children. °Patients are selected via a lottery and there is often a waiting list. °  °[facility] [address], °[place] ° (336) 763-8833 [email or web address] °  °[facility] [address], [place], [place] (336)723-1848, Ext. 123 Second and Fourth Thursday of each month, opens at 6:30 AM; Clinic ends at 9 AM.  Patients are seen on a first-come first-served basis, and a limited number are seen during each clinic.  ° °[facility] ° [address], [place], [place] (336) 723-7904    Eligibility Requirements °You must have lived in [place], [place], or [place] counties for at least the last three months. °  You cannot be eligible for state or federal sponsored healthcare insurance, including Veterans Administration, Medicaid, or Medicare. °  You generally cannot be eligible for healthcare insurance through your employer.  °  How to apply: °Eligibility screenings are held every Tuesday and Wednesday afternoon from 1:00 pm until 4:00 pm. You do not need an appointment for the interview!  °[facility] [address], [place], [place] 336-631-2330   °[facility]  336-342-8273   °[facility]  336-703-3100   °[facility]  336-570-6415   ° °Behavioral Health Resources in the Community: °Intensive Outpatient Programs °Organization         Address  Phone  Notes  °[facility] [address], [place], [place] 336-878-6098   °[facility] [address], [place], [place] 336-832-9800   °[facility] [address], [place], [place] ° 336-882-2125   °[facility] [address],  °[place], [place] 1-800-853-5163 or 336-641-4981   °Substance Abuse Resources °Organization         Address  Phone  Notes  °[facility]  336-882-2125   °[facility]  336-784-9470   °[facility]  336-285-9073   °[facility]  336-845-3988   °Residential & Outpatient Substance Abuse Program  1-800-659-3381   °Psychological Services °Organization         Address  Phone  Notes  °[facility]  336- 832-9600   °[facility]  336- 378-7881   °[facility] [address], [place] 1-800-853-5163 or 336-641-4981   ° °Mobile Crisis Teams °Organization         Address  Phone  Notes  °[facility], Mobile Crisis Care Unit  1-877-626-1772   °Assertive °[facility] ° [address]  [place], [place] 336-834-9664   °Sharon DeEsch [address] °[place] 336-554-5454   ° °Self-Help/Support Groups °Organization         Address  Phone             Notes  °[facility] of  - variety of support groups  336- 373-1402 Call for more information  °Narcotics Anonymous (NA), [facility] [address], °[place]  2 meetings at this location  ° °  Residential Treatment Programs Organization         Address  Phone  Notes  [facility] [address],    [place]  [phone]   [facility]  [address], [place] [phone], [place], [place] [phone]   [facility] [address], [place] [phone] Admissions: 8am-3pm M-F  [facility] [address]    [place], [place] [phone]   [facility] [address], [place], [place] [phone]   [facility] [address]  [place], [place] [phone]   [facility] Intensive Outpatient [address], [place], [place] [phone]) [phone]   [facility] [address]  [place], [place] [phone] or [phone]   [facility] [address] [place], [place] [phone] Accepts Medicaid  [facility] [address]  [place] [phone] Substance Abuse/Addiction Treatment   [facility]         Address  Phone  Notes  [facility]  [phone](888) [phone]   Angie FavaJulie Brannon, PhD [address], [address] [place], [place]   ([phone](336) [phone] or (240) [phone](336) [phone]   [facility] [place]   [address] [place], [place] [phone](336) [phone]   [facility] [address], [place], [place] [phone](336) [phone] Insurance/Medicaid/sponsorship through [facility]  [facility] [address]                                    [place], [place] [phone]) [phone] Therapy/tele-psych/case    [facility] [address]   [place], [place] [phone]    Dr. Lolly MustacheArfeen  [phone]) [phone]   [facility]  [facility] 1) [address], [place] 2) [address], [place] 3)  [address], [place] [phone]) [phone](336) [phone]) [phone]  [phone]   [facility] Child Abuse Hotline [phone](336) [phone] or [phone](336) [phone] (After Hours)      Take the prescriptions as directed.  Apply moist heat or ice to the area(s) of discomfort, for 15 minutes at a time, several times per day for the next few days.  Do not fall asleep on a heating or ice pack.  Call your regular medical doctor today to schedule a follow up appointment in the next 2- 3 days.  Return to the Emergency Department immediately if worsening.

## 2014-03-04 NOTE — ED Provider Notes (Signed)
CSN: [id number]     Arrival date & time 03/04/14  [id number] History   First MD Initiated Contact with Patient 03/04/14 0715     Chief Complaint  Patient presents with  . Chest Pain  . Abdominal Pain     HPI Pt was seen at 0720. Per pt, c/o gradual onset and persistence of constant acute flair of her chronic upper chest wall "pain" for the past 3 days. Pt states she was "lifting heavy things at work" before the discomfort began. Pt states last night approximately midnight she started to have discomfort "under my diaphragm." States she feels "like my abd is pushing up into my chest." Denies direct injury, no palpitations, no SOB/cough, no back pain, no N/V/D, no fevers, no rash.    Past Medical History  Diagnosis Date  . Hypertension   . Chronic systolic heart failure 05/2011    a. NICM b. 5/13 LHC: nl cors c. 7/14 ECHO: EF 35-40% diff HK, MV calcified annulus, LA mild dil, MV triv regur, TV triv regur  . Anemia   . Left bundle branch block   . Cardiomyopathy   . Iron deficiency anemia   . Headache   . Non-compliance   . Acute on chronic systolic heart failure   . Systolic congestive heart failure     due to HTN  . 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 Chronic back pain   . Sciatica   . Chest wall pain   . Headache    Past Surgical History  Procedure Laterality Date  . Cesarean section    . Cholecystectomy    . Tubal ligation    . Left and right heart catheterization with coronary angiogram N/A 06/10/2011    Procedure: LEFT AND RIGHT HEART CATHETERIZATION WITH CORONARY ANGIOGRAM;  Surgeon: Dolores Pattyaniel R Bensimhon, MD;  [facility];  Service: Cardiovascular;  Laterality: N/A;  . Cardiac catheterization  2013    normal coronary arteries   Family History  Problem Relation Age of Onset  . Heart failure Mother   . Hypertension Mother    History  Substance Use Topics  . Smoking status: Former Games developermoker  . Smokeless tobacco: Former NeurosurgeonUser    Quit date:  06/06/2006  . Alcohol Use: No    Review of Systems ROS: Statement: All systems negative except as marked or noted in the HPI; Constitutional: Negative for fever and chills. ; ; Eyes: Negative for eye pain, redness and discharge. ; ; ENMT: Negative for ear pain, hoarseness, nasal congestion, sinus pressure and sore throat. ; ; Cardiovascular: Negative for palpitations, diaphoresis, dyspnea and peripheral edema. ; ; Respiratory: Negative for cough, wheezing and stridor. ; ; Gastrointestinal: +abd pain. Negative for nausea, vomiting, diarrhea, blood in stool, hematemesis, jaundice and rectal bleeding. . ; ; Genitourinary: Negative for dysuria, flank pain and hematuria. ; ; Musculoskeletal: +chest wall pain. Negative for back pain and neck pain. Negative for swelling and trauma.; ; Skin: Negative for pruritus, rash, abrasions, blisters, bruising and skin lesion.; ; Neuro: Negative for headache, lightheadedness and neck stiffness. Negative for weakness, altered level of consciousness , altered mental status, extremity weakness, paresthesias, involuntary movement, seizure and syncope.      Allergies  Doxycycline  Home Medications   Prior to Admission medications   Medication Sig Start Date End Date Taking? Authorizing Provider  carvedilol (COREG) 12.5 MG tablet Take 12.5 mg by mouth daily.    Yes Historical Provider, MD  diphenhydrAMINE (BENADRYL) 25 mg  capsule Take 25-50 mg by mouth every 6 (six) hours as needed for allergies (allergies).    Yes Historical Provider, MD  furosemide (LASIX) 40 MG tablet Take 1 tablet (40 mg total) by mouth daily. 07/26/13  Yes Dwana Melena, PA-C  lisinopril (PRINIVIL,ZESTRIL) 20 MG tablet Take 20 mg by mouth daily.    Yes Historical Provider, MD  potassium chloride SA (K-DUR,KLOR-CON) 20 MEQ tablet Take 20 mEq by mouth 2 (two) times daily.   Yes Historical Provider, MD  spironolactone (ALDACTONE) 25 MG tablet Take 25 mg by mouth daily.    Yes Historical Provider, MD   chlorpheniramine-HYDROcodone (TUSSIONEX PENNKINETIC ER) 10-8 MG/5ML LQCR Take 5 mLs by mouth every 12 (twelve) hours as needed for cough. Patient not taking: Reported on 01/10/2014 01/03/14   Carlisle Beers Molpus, MD  cyclobenzaprine (FLEXERIL) 10 MG tablet Take 1 tablet (10 mg total) by mouth 3 (three) times daily as needed for muscle spasms. Patient not taking: Reported on 01/10/2014 10/19/13   Donnita [place] Camprubi-Soms, PA-C  HYDROcodone-acetaminophen (NORCO) 5-325 MG per tablet Take 1 tablet by mouth every 4 (four) hours as needed. Patient not taking: Reported on 01/10/2014 12/25/13   Toy Cookey, MD  ibuprofen (ADVIL,MOTRIN) 600 MG tablet Take 1 tablet (600 mg total) by mouth every 6 (six) hours as needed. Patient not taking: Reported on 01/10/2014 12/25/13   Toy Cookey, MD  naproxen (NAPROSYN) 500 MG tablet Take 1 tablet (500 mg total) by mouth 2 (two) times daily. Patient not taking: Reported on 01/10/2014 11/30/13   Earle Gell Cartner, PA-C  traMADol (ULTRAM) 50 MG tablet Take 1 tablet (50 mg total) by mouth every 6 (six) hours as needed. Patient not taking: Reported on 03/04/2014 01/10/14   Toni Amend A Forcucci, PA-C   BP 138/82 mmHg  Pulse 63  Temp(Src) 97.5 F (36.4 C) (Oral)  Resp 14  Ht  (1.651 m)  Wt 200 lb (90.719 kg)  BMI 33.28 kg/m2  SpO2 99%  LMP 02/20/2014 (Approximate) Physical Exam  0725: Physical examination:  Nursing notes reviewed; Vital signs and O2 SAT reviewed;  Constitutional: Well developed, Well nourished, Well hydrated, In no acute distress; Head:  Normocephalic, atraumatic; Eyes: EOMI, PERRL, No scleral icterus; ENMT: Mouth and pharynx normal, Mucous membranes moist; Neck: Supple, Full range of motion, No lymphadenopathy; Cardiovascular: Regular rate and rhythm, No murmur, rub, or gallop; Respiratory: Breath sounds clear & equal bilaterally, No rales, rhonchi, wheezes.  Speaking full sentences with ease, Normal respiratory effort/excursion; Chest: +bilat upper  anterior chest wall areas tender to palp. No rash, no soft tissue crepitus, no deformity. Movement normal; Abdomen: Soft, Nontender, Nondistended, Normal bowel sounds; Genitourinary: No CVA tenderness; Extremities: Pulses normal, No tenderness, No edema, No calf edema or asymmetry.; Neuro: AA&Ox3, Major CN grossly intact.  Speech clear. No gross focal motor or sensory deficits in extremities. Climbs on and off stretcher easily by herself. Gait steady.; Skin: Color normal, Warm, Dry.   ED Course  Procedures       EKG Interpretation   Date/Time:  Friday March 04 2014 04:07:27 EST Ventricular Rate:  71 PR Interval:  154 QRS Duration: 181 QT Interval:  469 QTC Calculation: 510 R Axis:   60 Text Interpretation:  Sinus rhythm Probable left atrial enlargement Left  bundle branch block No significant change was found Confirmed by Read Drivers   MD, Jonny Ruiz ([id number]) on 03/04/2014 4:55:11 AM      MDM  MDM Reviewed: previous chart, nursing note and vitals Reviewed previous: labs  and ECG Interpretation: labs, ECG and x-ray     Results for orders placed or performed during the hospital encounter of 03/04/14  CBC  Result Value Ref Range   WBC 7.9 4.0 - 10.5 K/uL   RBC 3.59 (L) 3.87 - 5.11 MIL/uL   Hemoglobin 10.5 (L) 12.0 - 15.0 g/dL   HCT 16.1 (L) 09.6 - 04.5 %   MCV 93.6 78.0 - 100.0 fL   MCH 29.2 26.0 - 34.0 pg   MCHC 31.3 30.0 - 36.0 g/dL   RDW 40.9 (H) 81.1 - 91.4 %   Platelets 217 150 - 400 K/uL  Basic metabolic panel  Result Value Ref Range   Sodium 141 135 - 145 mmol/L   Potassium 3.6 3.5 - 5.1 mmol/L   Chloride 106 96 - 112 mmol/L   CO2 26 19 - 32 mmol/L   Glucose, Bld 101 (H) 70 - 99 mg/dL   BUN 16 6 - 23 mg/dL   Creatinine, Ser 7.82 0.50 - 1.10 mg/dL   Calcium 9.2 8.4 - 95.6 mg/dL   GFR calc non Af Amer 70 (L) >90 mL/min   GFR calc Af Amer 82 (L) >90 mL/min   Anion gap 9 5 - 15  Lipase, blood  Result Value Ref Range   Lipase 42 11 - 59 U/L  Hepatic function panel   Result Value Ref Range   Total Protein 7.2 6.0 - 8.3 g/dL   Albumin 4.3 3.5 - 5.2 g/dL   AST 30 0 - 37 U/L   ALT 28 0 - 35 U/L   Alkaline Phosphatase 54 39 - 117 U/L   Total Bilirubin 0.3 0.3 - 1.2 mg/dL   Bilirubin, Direct <2.1 0.0 - 0.5 mg/dL   Indirect Bilirubin NOT CALCULATED 0.3 - 0.9 mg/dL  I-stat troponin, ED (not at [facility])  Result Value Ref Range   Troponin i, poc 0.01 0.00 - 0.08 ng/mL   Comment 3           Dg Abd Acute W/chest 03/04/2014   CLINICAL DATA:  Chest discomfort and right lower abdominal pain for 1 day  EXAM: ACUTE ABDOMEN SERIES (ABDOMEN 2 VIEW & CHEST 1 VIEW)  COMPARISON:  01/10/2014  FINDINGS: Cardiac shadow is within normal limits. No focal infiltrate or sizable effusion is seen.  Changes of prior cholecystectomy are noted. The abdomen demonstrates scattered large and small bowel gas. Fecal material is noted throughout the colon. No obstructive changes are noted. No acute bony abnormality is seen.  IMPRESSION: No acute abnormality in the chest and abdomen.   Electronically Signed   By: Alcide Clever M.D.   On: 03/04/2014 08:28    1010:  Feels better after meds and wants to go home now. Doubt PE as cause for symptoms with low risk [place].  Doubt ACS as cause for symptoms with normal troponin and unchanged EKG from previous after 3-4 days of constant symptoms. Tx symptomatically at this time. Dx and testing d/w pt.  Questions answered.  Verb understanding, agreeable to d/c home with outpt f/u.    Samuel Jester, DO 03/07/14 1117

## 2014-04-12 ENCOUNTER — Encounter (HOSPITAL_COMMUNITY): Payer: Self-pay | Admitting: Emergency Medicine

## 2014-04-12 ENCOUNTER — Emergency Department (HOSPITAL_COMMUNITY)
Admission: EM | Admit: 2014-04-12 | Discharge: 2014-04-13 | Disposition: A | Discharge status: Home / Self Care | Payer: Medicaid Other | Attending: Emergency Medicine | Admitting: Emergency Medicine

## 2014-04-12 DIAGNOSIS — K5900 Constipation, unspecified: Secondary | ICD-10-CM

## 2014-04-12 DIAGNOSIS — Z9049 Acquired absence of other specified parts of digestive tract: Secondary | ICD-10-CM | POA: Insufficient documentation

## 2014-04-12 DIAGNOSIS — Z79899 Other long term (current) drug therapy: Secondary | ICD-10-CM | POA: Insufficient documentation

## 2014-04-12 DIAGNOSIS — Z9851 Tubal ligation status: Secondary | ICD-10-CM | POA: Insufficient documentation

## 2014-04-12 DIAGNOSIS — Z8739 Personal history of other diseases of the musculoskeletal system and connective tissue: Secondary | ICD-10-CM | POA: Insufficient documentation

## 2014-04-12 DIAGNOSIS — Z862 Personal history of diseases of the blood and blood-forming organs and certain disorders involving the immune mechanism: Secondary | ICD-10-CM | POA: Insufficient documentation

## 2014-04-12 DIAGNOSIS — Z87891 Personal history of nicotine dependence: Secondary | ICD-10-CM | POA: Insufficient documentation

## 2014-04-12 DIAGNOSIS — Z9889 Other specified postprocedural states: Secondary | ICD-10-CM | POA: Insufficient documentation

## 2014-04-12 DIAGNOSIS — I10 Essential (primary) hypertension: Secondary | ICD-10-CM | POA: Insufficient documentation

## 2014-04-12 DIAGNOSIS — I5022 Chronic systolic (congestive) heart failure: Secondary | ICD-10-CM | POA: Insufficient documentation

## 2014-04-12 LAB — COMPREHENSIVE METABOLIC PANEL
ALBUMIN: 3.8 g/dL (ref 3.5–5.2)
ALT: 13 U/L (ref 0–35)
AST: 19 U/L (ref 0–37)
Alkaline Phosphatase: 46 U/L (ref 39–117)
Anion gap: 10 (ref 5–15)
BILIRUBIN TOTAL: 0.6 mg/dL (ref 0.3–1.2)
BUN: 7 mg/dL (ref 6–23)
CALCIUM: 8.8 mg/dL (ref 8.4–10.5)
CO2: 23 mmol/L (ref 19–32)
CREATININE: 0.74 mg/dL (ref 0.50–1.10)
Chloride: 108 mmol/L (ref 96–112)
GFR calc Af Amer: >90 mL/min (ref >90)
Glucose, Bld: 111 mg/dL — ABNORMAL HIGH (ref 70–99)
Potassium: 3.3 mmol/L — ABNORMAL LOW (ref 3.5–5.1)
Sodium: 141 mmol/L (ref 135–145)
Total Protein: 6.7 g/dL (ref 6.0–8.3)

## 2014-04-12 LAB — CBC WITH DIFFERENTIAL/PLATELET
BASOS ABS: 0 10*3/uL (ref 0.0–0.1)
Basophils Relative: 0 % (ref 0–1)
EOS PCT: 0 % (ref 0–5)
Eosinophils Absolute: 0.1 10*3/uL (ref 0.0–0.7)
HCT: 36 % (ref 36.0–46.0)
Hemoglobin: 11.3 g/dL — ABNORMAL LOW (ref 12.0–15.0)
Lymphocytes Relative: 9 % — ABNORMAL LOW (ref 12–46)
Lymphs Abs: 1.1 10*3/uL (ref 0.7–4.0)
MCH: 28 pg (ref 26.0–34.0)
MCHC: 31.4 g/dL (ref 30.0–36.0)
MCV: 89.3 fL (ref 78.0–100.0)
MONOS PCT: 4 % (ref 3–12)
Monocytes Absolute: 0.5 10*3/uL (ref 0.1–1.0)
Neutro Abs: 10.5 10*3/uL — ABNORMAL HIGH (ref 1.7–7.7)
Neutrophils Relative %: 87 % — ABNORMAL HIGH (ref 43–77)
PLATELETS: 276 10*3/uL (ref 150–400)
RBC: 4.03 MIL/uL (ref 3.87–5.11)
RDW: 14.7 % (ref 11.5–15.5)
WBC: 12.2 10*3/uL — ABNORMAL HIGH (ref 4.0–10.5)

## 2014-04-12 MED ORDER — CARVEDILOL 12.5 MG PO TABS
12.5000 mg | ORAL_TABLET | Freq: Once | ORAL | Status: AC
  Administered 2014-04-13: 12.5 mg via ORAL
  Filled 2014-04-12 (×2): qty 1

## 2014-04-12 MED ORDER — HYDROMORPHONE HCL 1 MG/ML IJ SOLN
0.5000 mg | Freq: Once | INTRAMUSCULAR | Status: AC
  Administered 2014-04-12: 0.5 mg via INTRAVENOUS
  Filled 2014-04-12: qty 1

## 2014-04-12 MED ORDER — SODIUM CHLORIDE 0.9 % IV BOLUS (SEPSIS)
1000.0000 mL | INTRAVENOUS | Status: AC
  Administered 2014-04-12: 1000 mL via INTRAVENOUS

## 2014-04-12 MED ORDER — LISINOPRIL 20 MG PO TABS
20.0000 mg | ORAL_TABLET | Freq: Once | ORAL | Status: AC
  Administered 2014-04-13: 20 mg via ORAL
  Filled 2014-04-12: qty 1

## 2014-04-12 NOTE — ED Notes (Signed)
Message sent to pharmacy for missing dose Carvedilol and lisinopril

## 2014-04-12 NOTE — ED Provider Notes (Signed)
CSN: [id number]     Arrival date & time 04/12/14  2207 History   First MD Initiated Contact with Patient 04/12/14 2258     Chief Complaint  Patient presents with  . Constipation  . Vaginal Discharge  . Abdominal Pain     (Consider location/radiation/quality/duration/timing/severity/associated sxs/prior Treatment) HPI  Laurie Wade is a 34 y.o. female with past medical history of hypertension, congestive heart failure presenting today with constipation. Patient states her last bowel movement was 4 days ago and she denies ever tracing this in the past. She is now experiencing diffuse cramping abdominal pain. She is taking over-the-counter stool softeners and rectal enema at home without any relief. Patient also complains of vaginal discharge over the last 2 days, she has had unprotected sex approximately 1-2 weeks ago. She does have a history of STDs in the past. She denies any abnormal vaginal bleeding. Patient has no further complaints.  10 Systems reviewed and are negative for acute change except as noted in the HPI.    Past Medical History  Diagnosis Date  . Hypertension   . Chronic systolic heart failure 05/2011    a. NICM b. 5/13 LHC: nl cors c. 7/14 ECHO: EF 35-40% diff HK, MV calcified annulus, LA mild dil, MV triv regur, TV triv regur  . Anemia   . Left bundle branch block   . Cardiomyopathy   . Iron deficiency anemia   . Headache   . Non-compliance   . Acute on chronic systolic heart failure   . Systolic congestive heart failure     due to HTN  . 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 Chronic back pain   . Sciatica   . Chest wall pain   . Headache    Past Surgical History  Procedure Laterality Date  . Cesarean section    . Cholecystectomy    . Tubal ligation    . Left and right heart catheterization with coronary angiogram N/A 06/10/2011    Procedure: LEFT AND RIGHT HEART CATHETERIZATION WITH CORONARY ANGIOGRAM;  Surgeon: Dolores Patty, MD;  Location: [facility] CATH LAB;  Service: Cardiovascular;  Laterality: N/A;  . Cardiac catheterization  2013    normal coronary arteries   Family History  Problem Relation Age of Onset  . Heart failure Mother   . Hypertension Mother    History  Substance Use Topics  . Smoking status: Former Games developer  . Smokeless tobacco: Former Neurosurgeon    Quit date: 06/06/2006  . Alcohol Use: No   OB History    No data available     Review of Systems    Allergies  Doxycycline  Home Medications   Prior to Admission medications   Medication Sig Start Date End Date Taking? Authorizing Provider  carvedilol (COREG) 12.5 MG tablet Take 12.5 mg by mouth daily.    Yes Historical Provider, MD  diphenhydrAMINE (BENADRYL) 25 mg capsule Take 25-50 mg by mouth every 6 (six) hours as needed for allergies (allergies).    Yes Historical Provider, MD  furosemide (LASIX) 40 MG tablet Take 1 tablet (40 mg total) by mouth daily. 07/26/13  Yes Dwana Melena, PA-C  ibuprofen (ADVIL,MOTRIN) 200 MG tablet Take 600 mg by mouth every 6 (six) hours as needed (for pain.).   Yes Historical Provider, MD  lisinopril (PRINIVIL,ZESTRIL) 20 MG tablet Take 20 mg by mouth daily.    Yes Historical Provider, MD  potassium chloride SA (K-DUR,KLOR-CON) 20 MEQ tablet Take 20  mEq by mouth 2 (two) times daily.   Yes Historical Provider, MD  spironolactone (ALDACTONE) 25 MG tablet Take 25 mg by mouth daily.    Yes Historical Provider, MD  chlorpheniramine-HYDROcodone (TUSSIONEX PENNKINETIC ER) 10-8 MG/5ML LQCR Take 5 mLs by mouth every 12 (twelve) hours as needed for cough. Patient not taking: Reported on 01/10/2014 01/03/14   Paula LibraJohn Molpus, MD  cyclobenzaprine (FLEXERIL) 10 MG tablet Take 1 tablet (10 mg total) by mouth 3 (three) times daily as needed for muscle spasms. Patient not taking: Reported on 01/10/2014 10/19/13   Mercedes Camprubi-Soms, PA-C  HYDROcodone-acetaminophen (NORCO/VICODIN) 5-325 MG per tablet 1 or 2 tabs PO q6 hours  prn pain Patient not taking: Reported on 04/12/2014 03/04/14   Samuel JesterKathleen McManus, DO  ibuprofen (ADVIL,MOTRIN) 600 MG tablet Take 1 tablet (600 mg total) by mouth every 6 (six) hours as needed. Patient not taking: Reported on 01/10/2014 12/25/13   Toy CookeyMegan Docherty, MD  methocarbamol (ROBAXIN) 500 MG tablet Take 2 tablets (1,000 mg total) by mouth 4 (four) times daily as needed for muscle spasms (muscle spasm/pain). Patient not taking: Reported on 04/12/2014 03/04/14   Samuel JesterKathleen McManus, DO  naproxen (NAPROSYN) 500 MG tablet Take 1 tablet (500 mg total) by mouth 2 (two) times daily. Patient not taking: Reported on 01/10/2014 11/30/13   Joycie PeekBenjamin Cartner, PA-C  traMADol (ULTRAM) 50 MG tablet Take 1 tablet (50 mg total) by mouth every 6 (six) hours as needed. Patient not taking: Reported on 03/04/2014 01/10/14   Toni Amendourtney Forcucci, PA-C   BP 188/118 mmHg  Pulse 93  Temp(Src) 97.8 F (36.6 C) (Oral)  Resp 13  SpO2 100%  LMP 04/05/2014 Physical Exam  Constitutional: She is oriented to person, place, and time. She appears well-developed and well-nourished. No distress.  HENT:  Head: Normocephalic and atraumatic.  Nose: Nose normal.  Mouth/Throat: Oropharynx is clear and moist. No oropharyngeal exudate.  Eyes: Conjunctivae and EOM are normal. Pupils are equal, round, and reactive to light. No scleral icterus.  Neck: Normal range of motion. Neck supple. No JVD present. No tracheal deviation present. No thyromegaly present.  Cardiovascular: Normal rate, regular rhythm and normal heart sounds.  Exam reveals no gallop and no friction rub.   No murmur heard. Pulmonary/Chest: Effort normal and breath sounds normal. No respiratory distress. She has no wheezes. She exhibits no tenderness.  Abdominal: Soft. Bowel sounds are normal. She exhibits no distension and no mass. There is no tenderness. There is no rebound and no guarding.  Genitourinary: Vagina normal and uterus normal. No vaginal discharge found.  Large  amounts of hard stool found in the rectal vault.  Cervical exam- no CMT or adnexal tenderness. No discharge seen.  Musculoskeletal: Normal range of motion. She exhibits no edema or tenderness.  Lymphadenopathy:    She has no cervical adenopathy.  Neurological: She is alert and oriented to person, place, and time. No cranial nerve deficit. She exhibits normal muscle tone.  Skin: Skin is warm and dry. No rash noted. She is not diaphoretic. No erythema. No pallor.  Nursing note and vitals reviewed.   ED Course  Fecal disimpaction Date/Time: 04/12/2014 11:41 PM Performed by: Tomasita CrumbleNI, Raianna Slight Authorized by: Tomasita CrumbleNI, Roth Ress Consent: Verbal consent obtained. Written consent not obtained. Risks and benefits: risks, benefits and alternatives were discussed Consent given by: patient Patient understanding: patient states understanding of the procedure being performed Patient consent: the patient's understanding of the procedure matches consent given Site marked: the operative site was not marked Imaging studies: imaging  studies not available Patient identity confirmed: verbally with patient Time out: Immediately prior to procedure a "time out" was called to verify the correct patient, procedure, equipment, support staff and site/side marked as required. Local anesthesia used: no Patient sedated: no Patient tolerance: Patient tolerated the procedure well with no immediate complications Comments: Moderate amount of stool evacuated, patient could not tolerate full procedure.   (including critical care time) Labs Review Labs Reviewed  CBC WITH DIFFERENTIAL/PLATELET - Abnormal; Notable for the following:    WBC 12.2 (*)    Hemoglobin 11.3 (*)    Neutrophils Relative % 87 (*)    Neutro Abs 10.5 (*)    Lymphocytes Relative 9 (*)    All other components within normal limits  COMPREHENSIVE METABOLIC PANEL - Abnormal; Notable for the following:    Potassium 3.3 (*)    Glucose, Bld 111 (*)    All other  components within normal limits  WET PREP, GENITAL  HIV ANTIBODY (ROUTINE TESTING)  GC/CHLAMYDIA PROBE AMP ([place])    Imaging Review No results found.   EKG Interpretation   Date/Time:  Tuesday April 12 2014 22:42:01 EDT Ventricular Rate:  92 PR Interval:  158 QRS Duration: 174 QT Interval:  438 QTC Calculation: 542 R Axis:   1 Text Interpretation:  Sinus rhythm LAE, consider biatrial enlargement Left  bundle branch block Probable RV involvement, suggest recording right  precordial leads Baseline wander in lead(s) V1 V2 V5 No significant change  since last tracing Confirmed by HARRISON  MD, FORREST ([id number]) on 04/12/2014  10:45:47 PM      MDM   Final diagnoses:  None    Patient presents emergency department for constipation and vaginal discharge. Disimpaction performed, moderate amount of stool is evacuated however the patient could not tolerate the rest of the procedure. She'll be given magnesium citrate upon discharge. Patient is hypertensive in the emergency department, she has not taking her nighttime medication which was also given to her here. White blood cell count is 12 which is a nonspecific finding. She has no infectious history. Patient was also given a prescription for Miralax to use daily. Her vital signs were within her normal limits and she is safe for discharge with primary care follow-up within 3 days.      Tomasita Crumble, MD 04/13/14 [phone]

## 2014-04-12 NOTE — ED Notes (Signed)
Pelvic cart at bedside. 

## 2014-04-12 NOTE — ED Notes (Signed)
Pt reports she has been unable to have BM for the past couple of days and is experiencing severe abdominal pain. Pt reports she has done enema at home and taken stool softener. Pt also reports vaginal discharge.

## 2014-04-13 LAB — WET PREP, GENITAL
Clue Cells Wet Prep HPF POC: NONE SEEN
TRICH WET PREP: NONE SEEN
WBC, Wet Prep HPF POC: NONE SEEN
Yeast Wet Prep HPF POC: NONE SEEN

## 2014-04-13 LAB — GC/CHLAMYDIA PROBE AMP (~~LOC~~) NOT AT ARMC
CHLAMYDIA, DNA PROBE: NEGATIVE
Neisseria Gonorrhea: NEGATIVE

## 2014-04-13 MED ORDER — POLYETHYLENE GLYCOL 3350 17 G PO PACK: 17.0000 g | PACK | Freq: Every day | ORAL | Status: DC | PRN

## 2014-04-13 MED ORDER — MAGNESIUM CITRATE PO SOLN
1.0000 | Freq: Once | ORAL | Status: AC
  Administered 2014-04-13: 1 via ORAL
  Filled 2014-04-13: qty 296

## 2014-04-13 MED ORDER — POTASSIUM CHLORIDE CRYS ER 20 MEQ PO TBCR
40.0000 meq | EXTENDED_RELEASE_TABLET | Freq: Once | ORAL | Status: AC
  Administered 2014-04-13: 40 meq via ORAL
  Filled 2014-04-13: qty 2

## 2014-04-13 NOTE — Discharge Instructions (Signed)
Constipation Ms. Laurie Wade, see your doctor within 3 days for follow up of your constipation.  Take miralax as prescribed and titrate so that you have 1-2 soft bowel movements per day.  If symptoms worsen, come back to the ED immediately. Thank you. Constipation is when a person:  Poops (has a bowel movement) less than 3 times a week.  Has a hard time pooping.  Has poop that is dry, hard, or bigger than normal. HOME CARE   Eat foods with a lot of fiber in them. This includes fruits, vegetables, beans, and whole grains such as brown rice.  Avoid fatty foods and foods with a lot of sugar. This includes french fries, hamburgers, cookies, candy, and soda.  If you are not getting enough fiber from food, take products with added fiber in them (supplements).  Drink enough fluid to keep your pee (urine) clear or pale yellow.  Exercise on a regular basis, or as told by your doctor.  Go to the restroom when you feel like you need to poop. Do not hold it.  Only take medicine as told by your doctor. Do not take medicines that help you poop (laxatives) without talking to your doctor first. GET HELP RIGHT AWAY IF:   You have bright red blood in your poop (stool).  Your constipation lasts more than 4 days or gets worse.  You have belly (abdominal) or butt (rectal) pain.  You have thin poop (as thin as a pencil).  You lose weight, and it cannot be explained. MAKE SURE YOU:   Understand these instructions.  Will watch your condition.  Will get help right away if you are not doing well or get worse. Document Released: 06/12/2007 Document Revised: 12/29/2012 Document Reviewed: 10/05/2012 [facility] Patient Information 2015 [place], [place]. This information is not intended to replace advice given to you by your health care provider. Make sure you discuss any questions you have with your health care provider.

## 2014-04-21 ENCOUNTER — Encounter (HOSPITAL_BASED_OUTPATIENT_CLINIC_OR_DEPARTMENT_OTHER): Payer: Self-pay | Admitting: *Deleted

## 2014-04-21 ENCOUNTER — Emergency Department (HOSPITAL_BASED_OUTPATIENT_CLINIC_OR_DEPARTMENT_OTHER)
Admission: EM | Admit: 2014-04-21 | Discharge: 2014-04-21 | Disposition: A | Discharge status: Home / Self Care | Payer: Medicaid Other | Attending: Emergency Medicine | Admitting: Emergency Medicine

## 2014-04-21 DIAGNOSIS — Z87891 Personal history of nicotine dependence: Secondary | ICD-10-CM | POA: Insufficient documentation

## 2014-04-21 DIAGNOSIS — Y92410 Unspecified street and highway as the place of occurrence of the external cause: Secondary | ICD-10-CM | POA: Insufficient documentation

## 2014-04-21 DIAGNOSIS — I5023 Acute on chronic systolic (congestive) heart failure: Secondary | ICD-10-CM | POA: Insufficient documentation

## 2014-04-21 DIAGNOSIS — S3992XA Unspecified injury of lower back, initial encounter: Secondary | ICD-10-CM | POA: Insufficient documentation

## 2014-04-21 DIAGNOSIS — I10 Essential (primary) hypertension: Secondary | ICD-10-CM | POA: Insufficient documentation

## 2014-04-21 DIAGNOSIS — Y9389 Activity, other specified: Secondary | ICD-10-CM | POA: Insufficient documentation

## 2014-04-21 DIAGNOSIS — M545 Low back pain, unspecified: Secondary | ICD-10-CM

## 2014-04-21 DIAGNOSIS — S24109A Unspecified injury at unspecified level of thoracic spinal cord, initial encounter: Secondary | ICD-10-CM | POA: Insufficient documentation

## 2014-04-21 DIAGNOSIS — Z9119 Patient's noncompliance with other medical treatment and regimen: Secondary | ICD-10-CM | POA: Insufficient documentation

## 2014-04-21 DIAGNOSIS — Z79899 Other long term (current) drug therapy: Secondary | ICD-10-CM | POA: Insufficient documentation

## 2014-04-21 DIAGNOSIS — Z862 Personal history of diseases of the blood and blood-forming organs and certain disorders involving the immune mechanism: Secondary | ICD-10-CM | POA: Insufficient documentation

## 2014-04-21 DIAGNOSIS — M546 Pain in thoracic spine: Secondary | ICD-10-CM

## 2014-04-21 DIAGNOSIS — G8929 Other chronic pain: Secondary | ICD-10-CM | POA: Insufficient documentation

## 2014-04-21 DIAGNOSIS — Z791 Long term (current) use of non-steroidal anti-inflammatories (NSAID): Secondary | ICD-10-CM | POA: Insufficient documentation

## 2014-04-21 DIAGNOSIS — Y998 Other external cause status: Secondary | ICD-10-CM | POA: Insufficient documentation

## 2014-04-21 MED ORDER — CYCLOBENZAPRINE HCL 10 MG PO TABS
10.0000 mg | ORAL_TABLET | Freq: Two times a day (BID) | ORAL | Status: DC | PRN
Start: 2014-04-21 — End: 2014-05-16
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
## 2014-04-23 ENCOUNTER — Encounter (HOSPITAL_COMMUNITY): Payer: Self-pay | Admitting: Emergency Medicine

## 2014-04-23 ENCOUNTER — Emergency Department (HOSPITAL_COMMUNITY)
Admission: EM | Admit: 2014-04-23 | Discharge: 2014-04-23 | Disposition: A | Discharge status: Home / Self Care | Payer: Medicaid Other | Attending: Emergency Medicine | Admitting: Emergency Medicine

## 2014-04-23 DIAGNOSIS — M5432 Sciatica, left side: Secondary | ICD-10-CM | POA: Insufficient documentation

## 2014-04-23 DIAGNOSIS — M25512 Pain in left shoulder: Secondary | ICD-10-CM | POA: Insufficient documentation

## 2014-04-23 DIAGNOSIS — I158 Other secondary hypertension: Secondary | ICD-10-CM

## 2014-04-23 DIAGNOSIS — I5023 Acute on chronic systolic (congestive) heart failure: Secondary | ICD-10-CM | POA: Insufficient documentation

## 2014-04-23 DIAGNOSIS — G8929 Other chronic pain: Secondary | ICD-10-CM | POA: Insufficient documentation

## 2014-04-23 DIAGNOSIS — Z79899 Other long term (current) drug therapy: Secondary | ICD-10-CM | POA: Insufficient documentation

## 2014-04-23 DIAGNOSIS — Z9119 Patient's noncompliance with other medical treatment and regimen: Secondary | ICD-10-CM | POA: Insufficient documentation

## 2014-04-23 DIAGNOSIS — Z87891 Personal history of nicotine dependence: Secondary | ICD-10-CM | POA: Insufficient documentation

## 2014-04-23 DIAGNOSIS — I10 Essential (primary) hypertension: Secondary | ICD-10-CM | POA: Insufficient documentation

## 2014-04-23 DIAGNOSIS — Z9889 Other specified postprocedural states: Secondary | ICD-10-CM | POA: Insufficient documentation

## 2014-04-23 DIAGNOSIS — Z791 Long term (current) use of non-steroidal anti-inflammatories (NSAID): Secondary | ICD-10-CM | POA: Insufficient documentation

## 2014-04-23 DIAGNOSIS — Z862 Personal history of diseases of the blood and blood-forming organs and certain disorders involving the immune mechanism: Secondary | ICD-10-CM | POA: Insufficient documentation

## 2014-04-23 MED ORDER — TRAMADOL HCL 50 MG PO TABS: 50.0000 mg | ORAL_TABLET | Freq: Four times a day (QID) | ORAL | Status: DC | PRN

## 2014-04-23 MED ORDER — KETOROLAC TROMETHAMINE 60 MG/2ML IM SOLN
60.0000 mg | Freq: Once | INTRAMUSCULAR | Status: AC
  Administered 2014-04-23: 60 mg via INTRAMUSCULAR
  Filled 2014-04-23: qty 2

## 2014-04-23 MED ORDER — LIDOCAINE 5 % EX PTCH: 1.0000 | MEDICATED_PATCH | CUTANEOUS | Status: DC

## 2014-04-23 MED ORDER — NAPROXEN 500 MG PO TABS: 500.0000 mg | ORAL_TABLET | Freq: Two times a day (BID) | ORAL | Status: DC

## 2014-04-23 MED ORDER — DEXAMETHASONE SODIUM PHOSPHATE 10 MG/ML IJ SOLN
10.0000 mg | Freq: Once | INTRAMUSCULAR | Status: AC
  Administered 2014-04-23: 10 mg via INTRAMUSCULAR
  Filled 2014-04-23: qty 1
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
## 2014-04-23 NOTE — Discharge Instructions (Signed)
SEEK IMMEDIATE MEDICAL ATTENTION IF: °New numbness, tingling, weakness, or problem with the use of your arms or legs.  °Severe back pain not relieved with medications.  °Change in bowel or bladder control.  °Increasing pain in any areas of the body (such as chest or abdominal pain).  °Shortness of breath, dizziness or fainting.  °Nausea (feeling sick to your stomach), vomiting, fever, or sweats. ° ° °Sciatica °Sciatica is pain, weakness, numbness, or tingling along the path of the sciatic nerve. The nerve starts in the lower back and runs down the back of each leg. The nerve controls the muscles in the lower leg and in the back of the knee, while also providing sensation to the back of the thigh, lower leg, and the sole of your foot. Sciatica is a symptom of another medical condition. For instance, nerve damage or certain conditions, such as a herniated disk or bone spur on the spine, pinch or put pressure on the sciatic nerve. This causes the pain, weakness, or other sensations normally associated with sciatica. Generally, sciatica only affects one side of the body. °CAUSES  °· Herniated or slipped disc. °· Degenerative disk disease. °· A pain disorder involving the narrow muscle in the buttocks (piriformis syndrome). °· Pelvic injury or fracture. °· Pregnancy. °· Tumor (rare). °SYMPTOMS  °Symptoms can vary from mild to very severe. The symptoms usually travel from the low back to the buttocks and down the back of the leg. Symptoms can include: °· Mild tingling or dull aches in the lower back, leg, or hip. °· Numbness in the back of the calf or sole of the foot. °· Burning sensations in the lower back, leg, or hip. °· Sharp pains in the lower back, leg, or hip. °· Leg weakness. °· Severe back pain inhibiting movement. °These symptoms may get worse with coughing, sneezing, laughing, or prolonged sitting or standing. Also, being overweight may worsen symptoms. °DIAGNOSIS  °Your caregiver will perform a physical exam  to look for common symptoms of sciatica. He or she may ask you to do certain movements or activities that would trigger sciatic nerve pain. Other tests may be performed to find the cause of the sciatica. These may include: °· Blood tests. °· X-rays. °· Imaging tests, such as an MRI or CT scan. °TREATMENT  °Treatment is directed at the cause of the sciatic pain. Sometimes, treatment is not necessary and the pain and discomfort goes away on its own. If treatment is needed, your caregiver may suggest: °· Over-the-counter medicines to relieve pain. °· Prescription medicines, such as anti-inflammatory medicine, muscle relaxants, or narcotics. °· Applying heat or ice to the painful area. °· Steroid injections to lessen pain, irritation, and inflammation around the nerve. °· Reducing activity during periods of pain. °· Exercising and stretching to strengthen your abdomen and improve flexibility of your spine. Your caregiver may suggest losing weight if the extra weight makes the back pain worse. °· Physical therapy. °· Surgery to eliminate what is pressing or pinching the nerve, such as a bone spur or part of a herniated disk. °HOME CARE INSTRUCTIONS  °· Only take over-the-counter or prescription medicines for pain or discomfort as directed by your caregiver. °· Apply ice to the affected area for 20 minutes, 3-4 times a day for the first 48-72 hours. Then try heat in the same way. °· Exercise, stretch, or perform your usual activities if these do not aggravate your pain. °· Attend physical therapy sessions as directed by your caregiver. °· Keep   all follow-up appointments as directed by your caregiver. °· Do not wear high heels or shoes that do not provide proper support. °· Check your mattress to see if it is too soft. A firm mattress may lessen your pain and discomfort. °SEEK IMMEDIATE MEDICAL CARE IF:  °· You lose control of your bowel or bladder (incontinence). °· You have increasing weakness in the lower back, pelvis,  buttocks, or legs. °· You have redness or swelling of your back. °· You have a burning sensation when you urinate. °· You have pain that gets worse when you lie down or awakens you at night. °· Your pain is worse than you have experienced in the past. °· Your pain is lasting longer than 4 weeks. °· You are suddenly losing weight without reason. °MAKE SURE YOU: °· Understand these instructions. °· Will watch your condition. °· Will get help right away if you are not doing well or get worse. °Document Released: 12/18/2000 Document Revised: 06/25/2011 Document Reviewed: 05/05/2011 °[employer]® Patient Information ©2015 [employer]. This information is not intended to replace advice given to you by your health care provider. Make sure you discuss any questions you have with your health care provider. ° °

## 2014-05-04 ENCOUNTER — Telehealth: Payer: Self-pay | Admitting: Physician Assistant

## 2014-05-04 ENCOUNTER — Telehealth (HOSPITAL_COMMUNITY): Payer: Self-pay | Admitting: Vascular Surgery

## 2014-05-04 NOTE — Telephone Encounter (Signed)
[facility] on [facility]icemail she has some questions about this pt.. Please advise [phone] ext [phone]

## 2014-05-04 NOTE — Telephone Encounter (Signed)
[facility] She states patient has been non complaint with follow up appointment. Please informed patient  - make appointment with clinic --[phone]-- Will contact patitent

## 2014-05-04 NOTE — Telephone Encounter (Signed)
This message from the Answering Service:-Pt missing Lasix medicine,getting out of breath.She wants her medicine called in to the pharmacy this morning please.

## 2014-05-04 NOTE — Telephone Encounter (Signed)
[facility]. PATIENT HAS NOT SHOWN UP FOR OUTPATIENT VISIT - CHF CLINIC OR CARDIOLOGY.  LEFT MESSAGE TO DISCUSS WITH CHF CLINIC. AWAITING FOR CALL BACK - WILL CONTACT  PATIENT

## 2014-05-04 NOTE — Telephone Encounter (Signed)
Called no answer

## 2014-05-04 NOTE — Telephone Encounter (Signed)
Spoke w/Sharon, Ines BloomerKamilah please call pt to schedule a f/u appt, last seen Feb 2015

## 2014-05-04 NOTE — Telephone Encounter (Signed)
Called patient -unable to leave message --voicemail filled- able to leave call back number.  - a prescription will sent after speaking with patient- only 1 one month supply

## 2014-05-05 MED ORDER — FUROSEMIDE 40 MG PO TABS: 40.0000 mg | ORAL_TABLET | Freq: Every day | ORAL | Status: DC

## 2014-05-05 NOTE — Telephone Encounter (Signed)
Rx(s) sent to pharmacy electronically.  

## 2014-05-05 NOTE — Telephone Encounter (Signed)
Called no answer unable to leave message

## 2014-05-16 ENCOUNTER — Emergency Department (HOSPITAL_COMMUNITY)
Admission: EM | Admit: 2014-05-16 | Discharge: 2014-05-16 | Disposition: A | Discharge status: Home / Self Care | Payer: Medicaid Other | Attending: Emergency Medicine | Admitting: Emergency Medicine

## 2014-05-16 ENCOUNTER — Encounter (HOSPITAL_COMMUNITY): Payer: Self-pay

## 2014-05-16 ENCOUNTER — Emergency Department (HOSPITAL_COMMUNITY): Payer: Medicaid Other

## 2014-05-16 DIAGNOSIS — Z791 Long term (current) use of non-steroidal anti-inflammatories (NSAID): Secondary | ICD-10-CM | POA: Insufficient documentation

## 2014-05-16 DIAGNOSIS — I110 Hypertensive heart disease with heart failure: Secondary | ICD-10-CM | POA: Insufficient documentation

## 2014-05-16 DIAGNOSIS — R519 Headache, unspecified: Secondary | ICD-10-CM

## 2014-05-16 DIAGNOSIS — Z87891 Personal history of nicotine dependence: Secondary | ICD-10-CM | POA: Insufficient documentation

## 2014-05-16 DIAGNOSIS — Z8739 Personal history of other diseases of the musculoskeletal system and connective tissue: Secondary | ICD-10-CM | POA: Insufficient documentation

## 2014-05-16 DIAGNOSIS — I5022 Chronic systolic (congestive) heart failure: Secondary | ICD-10-CM | POA: Insufficient documentation

## 2014-05-16 DIAGNOSIS — Z862 Personal history of diseases of the blood and blood-forming organs and certain disorders involving the immune mechanism: Secondary | ICD-10-CM | POA: Insufficient documentation

## 2014-05-16 DIAGNOSIS — I5020 Unspecified systolic (congestive) heart failure: Secondary | ICD-10-CM

## 2014-05-16 DIAGNOSIS — Z9114 Patient's other noncompliance with medication regimen: Secondary | ICD-10-CM | POA: Insufficient documentation

## 2014-05-16 DIAGNOSIS — R079 Chest pain, unspecified: Secondary | ICD-10-CM

## 2014-05-16 DIAGNOSIS — G8929 Other chronic pain: Secondary | ICD-10-CM | POA: Insufficient documentation

## 2014-05-16 DIAGNOSIS — Z9889 Other specified postprocedural states: Secondary | ICD-10-CM | POA: Insufficient documentation

## 2014-05-16 DIAGNOSIS — Z8673 Personal history of transient ischemic attack (TIA), and cerebral infarction without residual deficits: Secondary | ICD-10-CM | POA: Insufficient documentation

## 2014-05-16 DIAGNOSIS — I10 Essential (primary) hypertension: Secondary | ICD-10-CM

## 2014-05-16 DIAGNOSIS — R51 Headache: Secondary | ICD-10-CM

## 2014-05-16 DIAGNOSIS — Z79899 Other long term (current) drug therapy: Secondary | ICD-10-CM | POA: Insufficient documentation

## 2014-05-16 LAB — CBC
HEMATOCRIT: 33.6 % — AB (ref 36.0–46.0)
Hemoglobin: 10.4 g/dL — ABNORMAL LOW (ref 12.0–15.0)
MCH: 27.2 pg (ref 26.0–34.0)
MCHC: 31 g/dL (ref 30.0–36.0)
MCV: 87.7 fL (ref 78.0–100.0)
Platelets: 252 10*3/uL (ref 150–400)
RBC: 3.83 MIL/uL — ABNORMAL LOW (ref 3.87–5.11)
RDW: 15.1 % (ref 11.5–15.5)
WBC: 7 10*3/uL (ref 4.0–10.5)

## 2014-05-16 LAB — BASIC METABOLIC PANEL
Anion gap: 6 (ref 5–15)
BUN: 7 mg/dL (ref 6–20)
CO2: 23 mmol/L (ref 22–32)
Calcium: 9 mg/dL (ref 8.9–10.3)
Chloride: 110 mmol/L (ref 101–111)
Creatinine, Ser: 0.69 mg/dL (ref 0.44–1.00)
GFR calc Af Amer: >60 mL/min (ref >60)
GFR calc non Af Amer: >60 mL/min (ref >60)
Glucose, Bld: 111 mg/dL — ABNORMAL HIGH (ref 70–99)
Potassium: 3.7 mmol/L (ref 3.5–5.1)
Sodium: 139 mmol/L (ref 135–145)

## 2014-05-16 LAB — I-STAT TROPONIN, ED: TROPONIN I, POC: 0 ng/mL (ref 0.00–0.08)

## 2014-05-16 LAB — BRAIN NATRIURETIC PEPTIDE: B Natriuretic Peptide: 247.1 pg/mL — ABNORMAL HIGH (ref 0.0–100.0)

## 2014-05-16 MED ORDER — CARVEDILOL 12.5 MG PO TABS
12.5000 mg | ORAL_TABLET | Freq: Two times a day (BID) | ORAL | Status: DC
  Administered 2014-05-16: 12.5 mg via ORAL
  Filled 2014-05-16 (×3): qty 1

## 2014-05-16 MED ORDER — FUROSEMIDE 40 MG PO TABS: 40.0000 mg | ORAL_TABLET | Freq: Every day | ORAL | Status: DC

## 2014-05-16 MED ORDER — TRAMADOL HCL 50 MG PO TABS: 50.0000 mg | ORAL_TABLET | Freq: Four times a day (QID) | ORAL | Status: DC | PRN

## 2014-05-16 MED ORDER — CARVEDILOL 12.5 MG PO TABS: 12.5000 mg | ORAL_TABLET | Freq: Every day | ORAL | Status: DC

## 2014-05-16 MED ORDER — SODIUM CHLORIDE 0.9 % IV BOLUS (SEPSIS)
500.0000 mL | Freq: Once | INTRAVENOUS | Status: AC
  Administered 2014-05-16: 500 mL via INTRAVENOUS

## 2014-05-16 MED ORDER — DIPHENHYDRAMINE HCL 50 MG/ML IJ SOLN
25.0000 mg | Freq: Once | INTRAMUSCULAR | Status: AC
  Administered 2014-05-16: 25 mg via INTRAVENOUS
  Filled 2014-05-16: qty 1

## 2014-05-16 MED ORDER — LABETALOL HCL 5 MG/ML IV SOLN
20.0000 mg | Freq: Once | INTRAVENOUS | Status: AC
  Administered 2014-05-16: 20 mg via INTRAVENOUS
  Filled 2014-05-16: qty 4

## 2014-05-16 MED ORDER — METOCLOPRAMIDE HCL 5 MG/ML IJ SOLN
10.0000 mg | Freq: Once | INTRAMUSCULAR | Status: AC
  Administered 2014-05-16: 10 mg via INTRAVENOUS
  Filled 2014-05-16: qty 2

## 2014-05-16 MED ORDER — LISINOPRIL 20 MG PO TABS: 20.0000 mg | ORAL_TABLET | Freq: Every day | ORAL | Status: DC

## 2014-05-16 MED ORDER — MORPHINE SULFATE 4 MG/ML IJ SOLN
4.0000 mg | Freq: Once | INTRAMUSCULAR | Status: AC
Start: 2014-05-16 — End: 2014-05-16
  Administered 2014-05-16: 4 mg via INTRAVENOUS
  Filled 2014-05-16: qty 1

## 2014-05-16 MED ORDER — LISINOPRIL 20 MG PO TABS
20.0000 mg | ORAL_TABLET | Freq: Once | ORAL | Status: AC
  Administered 2014-05-16: 20 mg via ORAL
  Filled 2014-05-16: qty 1

## 2014-05-16 NOTE — Discharge Instructions (Signed)
Heart Failure °Heart failure is a condition in which the heart has trouble pumping blood. This means your heart does not pump blood efficiently for your body to work well. In some cases of heart failure, fluid may back up into your lungs or you may have swelling (edema) in your lower legs. Heart failure is usually a long-term (chronic) condition. It is important for you to take good care of yourself and follow your health care provider's treatment plan. °CAUSES  °Some health conditions can cause heart failure. Those health conditions include: °· High blood pressure (hypertension). Hypertension causes the heart muscle to work harder than normal. When pressure in the blood vessels is high, the heart needs to pump (contract) with more force in order to circulate blood throughout the body. High blood pressure eventually causes the heart to become stiff and weak. °· Coronary artery disease (CAD). CAD is the buildup of cholesterol and fat (plaque) in the arteries of the heart. The blockage in the arteries deprives the heart muscle of oxygen and blood. This can cause chest pain and may lead to a heart attack. High blood pressure can also contribute to CAD. °· Heart attack (myocardial infarction). A heart attack occurs when one or more arteries in the heart become blocked. The loss of oxygen damages the muscle tissue of the heart. When this happens, part of the heart muscle dies. The injured tissue does not contract as well and weakens the heart's ability to pump blood. °· Abnormal heart valves. When the heart valves do not open and close properly, it can cause heart failure. This makes the heart muscle pump harder to keep the blood flowing. °· Heart muscle disease (cardiomyopathy or myocarditis). Heart muscle disease is damage to the heart muscle from a variety of causes. These can include drug or alcohol abuse, infections, or unknown reasons. These can increase the risk of heart failure. °· Lung disease. Lung disease  makes the heart work harder because the lungs do not work properly. This can cause a strain on the heart, leading it to fail. °· Diabetes. Diabetes increases the risk of heart failure. High blood sugar contributes to high fat (lipid) levels in the blood. Diabetes can also cause slow damage to tiny blood vessels that carry important nutrients to the heart muscle. When the heart does not get enough oxygen and food, it can cause the heart to become weak and stiff. This leads to a heart that does not contract efficiently. °· Other conditions can contribute to heart failure. These include abnormal heart rhythms, thyroid problems, and low blood counts (anemia). °Certain unhealthy behaviors can increase the risk of heart failure, including: °· Being overweight. °· Smoking or chewing tobacco. °· Eating foods high in fat and cholesterol. °· Abusing illicit drugs or alcohol. °· Lacking physical activity. °SYMPTOMS  °Heart failure symptoms may vary and can be hard to detect. Symptoms may include: °· Shortness of breath with activity, such as climbing stairs. °· Persistent cough. °· Swelling of the feet, ankles, legs, or abdomen. °· Unexplained weight gain. °· Difficulty breathing when lying flat (orthopnea). °· Waking from sleep because of the need to sit up and get more air. °· Rapid heartbeat. °· Fatigue and loss of energy. °· Feeling light-headed, dizzy, or close to fainting. °· Loss of appetite. °· Nausea. °· Increased urination during the night (nocturia). °DIAGNOSIS  °A diagnosis of heart failure is based on your history, symptoms, physical examination, and diagnostic tests. Diagnostic tests for heart failure may include: °·   Echocardiography. °· Electrocardiography. °· Chest X-ray. °· Blood tests. °· Exercise stress test. °· Cardiac angiography. °· Radionuclide scans. °TREATMENT  °Treatment is aimed at managing the symptoms of heart failure. Medicines, behavioral changes, or surgical intervention may be necessary to  treat heart failure. °· Medicines to help treat heart failure may include: °· Angiotensin-converting enzyme (ACE) inhibitors. This type of medicine blocks the effects of a blood protein called angiotensin-converting enzyme. ACE inhibitors relax (dilate) the blood vessels and help lower blood pressure. °· Angiotensin receptor blockers (ARBs). This type of medicine blocks the actions of a blood protein called angiotensin. Angiotensin receptor blockers dilate the blood vessels and help lower blood pressure. °· Water pills (diuretics). Diuretics cause the kidneys to remove salt and water from the blood. The extra fluid is removed through urination. This loss of extra fluid lowers the volume of blood the heart pumps. °· Beta blockers. These prevent the heart from beating too fast and improve heart muscle strength. °· Digitalis. This increases the force of the heartbeat. °· Healthy behavior changes include: °· Obtaining and maintaining a healthy weight. °· Stopping smoking or chewing tobacco. °· Eating heart-healthy foods. °· Limiting or avoiding alcohol. °· Stopping illicit drug use. °· Physical activity as directed by your health care provider. °· Surgical treatment for heart failure may include: °· A procedure to open blocked arteries, repair damaged heart valves, or remove damaged heart muscle tissue. °· A pacemaker to improve heart muscle function and control certain abnormal heart rhythms. °· An internal cardioverter defibrillator to treat certain serious abnormal heart rhythms. °· A left ventricular assist device (LVAD) to assist the pumping ability of the heart. °HOME CARE INSTRUCTIONS  °· Take medicines only as directed by your health care provider. Medicines are important in reducing the workload of your heart, slowing the progression of heart failure, and improving your symptoms. °· Do not stop taking your medicine unless directed by your health care provider. °· Do not skip any dose of medicine. °· Refill your  prescriptions before you run out of medicine. Your medicines are needed every day. °· Engage in moderate physical activity if directed by your health care provider. Moderate physical activity can benefit some people. The elderly and people with severe heart failure should consult with a health care provider for physical activity recommendations. °· Eat heart-healthy foods. Food choices should be free of trans fat and low in saturated fat, cholesterol, and salt (sodium). Healthy choices include fresh or frozen fruits and vegetables, fish, lean meats, legumes, fat-free or low-fat dairy products, and whole grain or high fiber foods. Talk to a dietitian to learn more about heart-healthy foods. °· Limit sodium if directed by your health care provider. Sodium restriction may reduce symptoms of heart failure in some people. Talk to a dietitian to learn more about heart-healthy seasonings. °· Use healthy cooking methods. Healthy cooking methods include roasting, grilling, broiling, baking, poaching, steaming, or stir-frying. Talk to a dietitian to learn more about healthy cooking methods. °· Limit fluids if directed by your health care provider. Fluid restriction may reduce symptoms of heart failure in some people. °· Weigh yourself every day. Daily weights are important in the early recognition of excess fluid. You should weigh yourself every morning after you urinate and before you eat breakfast. Wear the same amount of clothing each time you weigh yourself. Record your daily weight. Provide your health care provider with your weight record. °· Monitor and record your blood pressure if directed by your health care   provider.  Check your pulse if directed by your health care provider.  Lose weight if directed by your health care provider. Weight loss may reduce symptoms of heart failure in some people.  Stop smoking or chewing tobacco. Nicotine makes your heart work harder by causing your blood vessels to constrict.  Do not use nicotine gum or patches before talking to your health care provider.  Keep all follow-up visits as directed by your health care provider. This is important.  Limit alcohol intake to no more than 1 drink per day for nonpregnant women and 2 drinks per day for men. One drink equals 12 ounces of beer, 5 ounces of wine, or 1 ounces of hard liquor. Drinking more than that is harmful to your heart. Tell your health care provider if you drink alcohol several times a week. Talk with your health care provider about whether alcohol is safe for you. If your heart has already been damaged by alcohol or you have severe heart failure, drinking alcohol should be stopped completely.  Stop illicit drug use.  Stay up-to-date with immunizations. It is especially important to prevent respiratory infections through current pneumococcal and influenza immunizations.  Manage other health conditions such as hypertension, diabetes, thyroid disease, or abnormal heart rhythms as directed by your health care provider.  Learn to manage stress.  Plan rest periods when fatigued.  Learn strategies to manage high temperatures. If the weather is extremely hot:  Avoid vigorous physical activity.  Use air conditioning or fans or seek a cooler location.  Avoid caffeine and alcohol.  Wear loose-fitting, lightweight, and light-colored clothing.  Learn strategies to manage cold temperatures. If the weather is extremely cold:  Avoid vigorous physical activity.  Layer clothes.  Wear mittens or gloves, a hat, and a scarf when going outside.  Avoid alcohol.  Obtain ongoing education and support as needed.  Participate in or seek rehabilitation as needed to maintain or improve independence and quality of life. SEEK MEDICAL CARE IF:   Your weight increases by 03 lb/1.4 kg in 1 day or 05 lb/2.3 kg in a week.  You have increasing shortness of breath that is unusual for you.  You are unable to participate in  your usual physical activities.  You tire easily.  You cough more than normal, especially with physical activity.  You have any or more swelling in areas such as your hands, feet, ankles, or abdomen.  You are unable to sleep because it is hard to breathe.  You feel like your heart is beating fast (palpitations).  You become dizzy or light-headed upon standing up. SEEK IMMEDIATE MEDICAL CARE IF:   You have difficulty breathing.  There is a change in mental status such as decreased alertness or difficulty with concentration.  You have a pain or discomfort in your chest.  You have an episode of fainting (syncope). MAKE SURE YOU:   Understand these instructions.  Will watch your condition.  Will get help right away if you are not doing well or get worse. Document Released: 12/24/2004 Document Revised: 05/10/2013 Document Reviewed: 01/24/2012 [facility] Patient Information 2015 [place], [place]. This information is not intended to replace advice given to you by your health care provider. Make sure you discuss any questions you have with your health care provider.  Chest Pain (Nonspecific) It is often hard to give a specific diagnosis for the cause of chest pain. There is always a chance that your pain could be related to something serious, such as a heart  attack or a blood clot in the lungs. You need to follow up with your health care provider for further evaluation. CAUSES   Heartburn.  Pneumonia or bronchitis.  Anxiety or stress.  Inflammation around your heart (pericarditis) or lung (pleuritis or pleurisy).  A blood clot in the lung.  A collapsed lung (pneumothorax). It can develop suddenly on its own (spontaneous pneumothorax) or from trauma to the chest.  Shingles infection (herpes zoster virus). The chest wall is composed of bones, muscles, and cartilage. Any of these can be the source of the pain.  The bones can be bruised by injury.  The muscles or cartilage can  be strained by coughing or overwork.  The cartilage can be affected by inflammation and become sore (costochondritis). DIAGNOSIS  Lab tests or other studies may be needed to find the cause of your pain. Your health care provider may have you take a test called an ambulatory electrocardiogram (ECG). An ECG records your heartbeat patterns over a 24-hour period. You may also have other tests, such as:  Transthoracic echocardiogram (TTE). During echocardiography, sound waves are used to evaluate how blood flows through your heart.  Transesophageal echocardiogram (TEE).  Cardiac monitoring. This allows your health care provider to monitor your heart rate and rhythm in real time.  Holter monitor. This is a portable device that records your heartbeat and can help diagnose heart arrhythmias. It allows your health care provider to track your heart activity for several days, if needed.  Stress tests by exercise or by giving medicine that makes the heart beat faster. TREATMENT   Treatment depends on what may be causing your chest pain. Treatment may include:  Acid blockers for heartburn.  Anti-inflammatory medicine.  Pain medicine for inflammatory conditions.  Antibiotics if an infection is present.  You may be advised to change lifestyle habits. This includes stopping smoking and avoiding alcohol, caffeine, and chocolate.  You may be advised to keep your head raised (elevated) when sleeping. This reduces the chance of acid going backward from your stomach into your esophagus. Most of the time, nonspecific chest pain will improve within 2-3 days with rest and mild pain medicine.  HOME CARE INSTRUCTIONS   If antibiotics were prescribed, take them as directed. Finish them even if you start to feel better.  For the next few days, avoid physical activities that bring on chest pain. Continue physical activities as directed.  Do not use any tobacco products, including cigarettes, chewing tobacco,  or electronic cigarettes.  Avoid drinking alcohol.  Only take medicine as directed by your health care provider.  Follow your health care provider's suggestions for further testing if your chest pain does not go away.  Keep any follow-up appointments you made. If you do not go to an appointment, you could develop lasting (chronic) problems with pain. If there is any problem keeping an appointment, call to reschedule. SEEK MEDICAL CARE IF:   Your chest pain does not go away, even after treatment.  You have a rash with blisters on your chest.  You have a fever. SEEK IMMEDIATE MEDICAL CARE IF:   You have increased chest pain or pain that spreads to your arm, neck, jaw, back, or abdomen.  You have shortness of breath.  You have an increasing cough, or you cough up blood.  You have severe back or abdominal pain.  You feel nauseous or vomit.  You have severe weakness.  You faint.  You have chills. This is an emergency. Do not wait  to see if the pain will go away. Get medical help at once. Call your local emergency services (911 in [place] Do not drive yourself to the hospital. MAKE SURE YOU:   Understand these instructions.  Will watch your condition.  Will get help right away if you are not doing well or get worse. Document Released: 10/03/2004 Document Revised: 12/29/2012 Document Reviewed: 07/30/2007 [facility] Patient Information 2015 [place], [place]. This information is not intended to replace advice given to you by your health care provider. Make sure you discuss any questions you have with your health care provider.  General Headache Without Cause A headache is pain or discomfort felt around the head or neck area. The specific cause of a headache may not be found. There are many causes and types of headaches. A few common ones are: Tension headaches. Migraine headaches. Cluster headaches. Chronic daily headaches. HOME CARE INSTRUCTIONS  Keep all follow-up appointments  with your caregiver or any specialist referral. Only take over-the-counter or prescription medicines for pain or discomfort as directed by your caregiver. Lie down in a dark, quiet room when you have a headache. Keep a headache journal to find out what may trigger your migraine headaches. For example, write down: What you eat and drink. How much sleep you get. Any change to your diet or medicines. Try massage or other relaxation techniques. Put ice packs or heat on the head and neck. Use these 3 to 4 times per day for 15 to 20 minutes each time, or as needed. Limit stress. Sit up straight, and do not tense your muscles. Quit smoking if you smoke. Limit alcohol use. Decrease the amount of caffeine you drink, or stop drinking caffeine. Eat and sleep on a regular schedule. Get 7 to 9 hours of sleep, or as recommended by your caregiver. Keep lights dim if bright lights bother you and make your headaches worse. SEEK MEDICAL CARE IF:  You have problems with the medicines you were prescribed. Your medicines are not working. You have a change from the usual headache. You have nausea or vomiting. SEEK IMMEDIATE MEDICAL CARE IF:  Your headache becomes severe. You have a fever. You have a stiff neck. You have loss of vision. You have muscular weakness or loss of muscle control. You start losing your balance or have trouble walking. You feel faint or pass out. You have severe symptoms that are different from your first symptoms. MAKE SURE YOU:  Understand these instructions. Will watch your condition. Will get help right away if you are not doing well or get worse. Document Released: 12/24/2004 Document Revised: 03/18/2011 Document Reviewed: 01/09/2011 [facility] Patient Information 2015 [place], [place]. This information is not intended to replace advice given to you by your health care provider. Make sure you discuss any questions you have with your health care provider.   Tramadol  tablets What is this medicine? TRAMADOL (TRA ma dole) is a pain reliever. It is used to treat moderate to severe pain in adults. This medicine may be used for other purposes; ask your health care provider or pharmacist if you have questions. COMMON BRAND NAME(S): Ultram What should I tell my health care provider before I take this medicine? They need to know if you have any of these conditions: -brain tumor -depression -drug abuse or addiction -head injury -if you frequently drink alcohol containing drinks -kidney disease or trouble passing urine -liver disease -lung disease, asthma, or breathing problems -seizures or epilepsy -suicidal thoughts, plans, or attempt; a previous suicide  attempt by you or a family member -an unusual or allergic reaction to tramadol, codeine, other medicines, foods, dyes, or preservatives -pregnant or trying to get pregnant -breast-feeding How should I use this medicine? Take this medicine by mouth with a full glass of water. Follow the directions on the prescription label. If the medicine upsets your stomach, take it with food or milk. Do not take more medicine than you are told to take. Talk to your pediatrician regarding the use of this medicine in children. Special care may be needed. Overdosage: If you think you have taken too much of this medicine contact a poison control center or emergency room at once. NOTE: This medicine is only for you. Do not share this medicine with others. What if I miss a dose? If you miss a dose, take it as soon as you can. If it is almost time for your next dose, take only that dose. Do not take double or extra doses. What may interact with this medicine? Do not take this medicine with any of the following medications: -MAOIs like Carbex, Eldepryl, Marplan, Nardil, and Parnate This medicine may also interact with the following medications: -alcohol or medicines that contain  alcohol -antihistamines -benzodiazepines -bupropion -carbamazepine or oxcarbazepine -clozapine -cyclobenzaprine -digoxin -furazolidone -linezolid -medicines for depression, anxiety, or psychotic disturbances -medicines for migraine headache like almotriptan, eletriptan, frovatriptan, naratriptan, rizatriptan, sumatriptan, zolmitriptan -medicines for pain like pentazocine, buprenorphine, butorphanol, meperidine, nalbuphine, and propoxyphene -medicines for sleep -muscle relaxants -naltrexone -phenobarbital -phenothiazines like perphenazine, thioridazine, chlorpromazine, mesoridazine, fluphenazine, prochlorperazine, promazine, and trifluoperazine -procarbazine -warfarin This list may not describe all possible interactions. Give your health care provider a list of all the medicines, herbs, non-prescription drugs, or dietary supplements you use. Also tell them if you smoke, drink alcohol, or use illegal drugs. Some items may interact with your medicine. What should I watch for while using this medicine? Tell your doctor or health care professional if your pain does not go away, if it gets worse, or if you have new or a different type of pain. You may develop tolerance to the medicine. Tolerance means that you will need a higher dose of the medicine for pain relief. Tolerance is normal and is expected if you take this medicine for a long time. Do not suddenly stop taking your medicine because you may develop a severe reaction. Your body becomes used to the medicine. This does NOT mean you are addicted. Addiction is a behavior related to getting and using a drug for a non-medical reason. If you have pain, you have a medical reason to take pain medicine. Your doctor will tell you how much medicine to take. If your doctor wants you to stop the medicine, the dose will be slowly lowered over time to avoid any side effects. You may get drowsy or dizzy. Do not drive, use machinery, or do anything that  needs mental alertness until you know how this medicine affects you. Do not stand or sit up quickly, especially if you are an older patient. This reduces the risk of dizzy or fainting spells. Alcohol can increase or decrease the effects of this medicine. Avoid alcoholic drinks. You may have constipation. Try to have a bowel movement at least every 2 to 3 days. If you do not have a bowel movement for 3 days, call your doctor or health care professional. Your mouth may get dry. Chewing sugarless gum or sucking hard candy, and drinking plenty of water may help. Contact your doctor if the problem  does not go away or is severe. What side effects may I notice from receiving this medicine? Side effects that you should report to your doctor or health care professional as soon as possible: -allergic reactions like skin rash, itching or hives, swelling of the face, lips, or tongue -breathing difficulties, wheezing -confusion -itching -light headedness or fainting spells -redness, blistering, peeling or loosening of the skin, including inside the mouth -seizures Side effects that usually do not require medical attention (report to your doctor or health care professional if they continue or are bothersome): -constipation -dizziness -drowsiness -headache -nausea, vomiting This list may not describe all possible side effects. Call your doctor for medical advice about side effects. You may report side effects to FDA at 1-800-FDA-1088. Where should I keep my medicine? Keep out of the reach of children. Store at room temperature between 15 and 30 degrees C (59 and 86 degrees F). Keep container tightly closed. Throw away any unused medicine after the expiration date. NOTE: This sheet is a summary. It may not cover all possible information. If you have questions about this medicine, talk to your doctor, pharmacist, or health care provider.  2015, [employer]. (2009-09-06 11:55:44)

## 2014-05-16 NOTE — ED Provider Notes (Signed)
CSN: [id number]     Arrival date & time 05/16/14  0350 History   First MD Initiated Contact with Patient 05/16/14 0522     Chief Complaint  Patient presents with  . Chest Pain  . Headache     (Consider location/radiation/quality/duration/timing/severity/associated sxs/prior Treatment) Patient is a 34 y.o. female presenting with chest pain and headaches. The history is provided by the patient.  Chest Pain Associated symptoms: headache   Headache She complains of about a 3-5 day history of increasing exertional dyspnea with paroxysmal nocturnal dyspnea. Over the same time, she has developed a global headache which she describes as a throbbing pain with associated photophobia and nausea. She also states that she ran out of her blood pressure medication between 1 and 2 weeks ago. There has been a constant, dull midsternal chest pain for the last 3 days. Nothing makes the pain better nothing makes it worse. She rates her overall pain at 8/10. She has not taken anything at home for pain.  Past Medical History  Diagnosis Date  . Hypertension   . Chronic systolic heart failure 05/2011    a. NICM b. 5/13 LHC: nl cors c. 7/14 ECHO: EF 35-40% diff HK, MV calcified annulus, LA mild dil, MV triv regur, TV triv regur  . Anemia   . Left bundle branch block   . Cardiomyopathy   . Iron deficiency anemia   . Headache   . Non-compliance   . Acute on chronic systolic heart failure   . Systolic congestive heart failure     due to HTN  . 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 Chronic back pain   . Sciatica   . Chest wall pain   . Headache    Past Surgical History  Procedure Laterality Date  . Cesarean section    . Cholecystectomy    . Tubal ligation    . Left and right heart catheterization with coronary angiogram N/A 06/10/2011    Procedure: LEFT AND RIGHT HEART CATHETERIZATION WITH CORONARY ANGIOGRAM;  Surgeon: Dolores Pattyaniel R Bensimhon, MD;  Location: [facility] CATH LAB;  Service:  Cardiovascular;  Laterality: N/A;  . Cardiac catheterization  2013    normal coronary arteries   Family History  Problem Relation Age of Onset  . Heart failure Mother   . Hypertension Mother    History  Substance Use Topics  . Smoking status: Former Games developermoker  . Smokeless tobacco: Former NeurosurgeonUser    Quit date: 06/06/2006  . Alcohol Use: No   OB History    No data available     Review of Systems  Cardiovascular: Positive for chest pain.  Neurological: Positive for headaches.  All other systems reviewed and are negative.     Allergies  Doxycycline  Home Medications   Prior to Admission medications   Medication Sig Start Date End Date Taking? Authorizing Provider  carvedilol (COREG) 12.5 MG tablet Take 12.5 mg by mouth daily.     Historical Provider, MD  cyclobenzaprine (FLEXERIL) 10 MG tablet Take 1 tablet (10 mg total) by mouth 2 (two) times daily as needed for muscle spasms. 04/21/14   Oswaldo ConroyVictoria Creech, PA-C  diphenhydrAMINE (BENADRYL) 25 mg capsule Take 25-50 mg by mouth every 6 (six) hours as needed for allergies (allergies).     Historical Provider, MD  furosemide (LASIX) 40 MG tablet Take 1 tablet (40 mg total) by mouth daily. <MUST MAKE APPOINTMENT WITH CARDIOLOGIST FOR MEDICATION> 05/05/14   Dwana MelenaBryan W Hager, PA-C  lidocaine (  LIDODERM) 5 % Place 1 patch onto the skin daily. Remove & Discard patch within 12 hours or as directed by MD 04/23/14   Arthor Captain, PA-C  lisinopril (PRINIVIL,ZESTRIL) 20 MG tablet Take 20 mg by mouth daily.     Historical Provider, MD  methocarbamol (ROBAXIN) 500 MG tablet Take 2 tablets (1,000 mg total) by mouth 4 (four) times daily as needed for muscle spasms (muscle spasm/pain). Patient not taking: Reported on 04/12/2014 03/04/14   Samuel Jester, DO  naproxen (NAPROSYN) 500 MG tablet Take 1 tablet (500 mg total) by mouth 2 (two) times daily. 04/23/14   Arthor Captain, PA-C  polyethylene glycol (MIRALAX / GLYCOLAX) packet Take 17 g by mouth daily as  needed for moderate constipation. 04/13/14   Tomasita Crumble, MD  potassium chloride SA (K-DUR,KLOR-CON) 20 MEQ tablet Take 20 mEq by mouth 2 (two) times daily.    Historical Provider, MD  spironolactone (ALDACTONE) 25 MG tablet Take 25 mg by mouth daily.     Historical Provider, MD  traMADol (ULTRAM) 50 MG tablet Take 1 tablet (50 mg total) by mouth every 6 (six) hours as needed. 04/23/14   Abigail Harris, PA-C   BP 185/117 mmHg  Pulse 103  Temp(Src) 98.6 F (37 C) (Oral)  Resp 18  SpO2 97%  LMP 04/25/2014 (Approximate) Physical Exam  Nursing note and vitals reviewed.  34 year old female, resting comfortably and in no acute distress. Vital signs are significant for hypertension and mild tachycardia. Oxygen saturation is 97%, which is normal. Head is normocephalic and atraumatic. PERRLA, EOMI. Oropharynx is clear. Fundi show no hemorrhage, exudate, or papilledema. Neck is nontender and supple without adenopathy or JVD. Back is nontender and there is no CVA tenderness. Lungs are clear without rales, wheezes, or rhonchi. Chest is nontender. Heart has regular rate and rhythm without murmur. Abdomen is soft, flat, nontender without masses or hepatosplenomegaly and peristalsis is normoactive. Extremities have 1+ edema, full range of motion is present. Skin is warm and dry without rash. Neurologic: Mental status is normal, cranial nerves are intact, there are no motor or sensory deficits.  ED Course  Procedures (including critical care time) Labs Review Results for orders placed or performed during the hospital encounter of 05/16/14  CBC  Result Value Ref Range   WBC 7.0 4.0 - 10.5 K/uL   RBC 3.83 (L) 3.87 - 5.11 MIL/uL   Hemoglobin 10.4 (L) 12.0 - 15.0 g/dL   HCT 16.1 (L) 09.6 - 04.5 %   MCV 87.7 78.0 - 100.0 fL   MCH 27.2 26.0 - 34.0 pg   MCHC 31.0 30.0 - 36.0 g/dL   RDW 40.9 81.1 - 91.4 %   Platelets 252 150 - 400 K/uL  Basic metabolic panel  Result Value Ref Range   Sodium 139 135  - 145 mmol/L   Potassium 3.7 3.5 - 5.1 mmol/L   Chloride 110 101 - 111 mmol/L   CO2 23 22 - 32 mmol/L   Glucose, Bld 111 (H) 70 - 99 mg/dL   BUN 7 6 - 20 mg/dL   Creatinine, Ser 7.82 0.44 - 1.00 mg/dL   Calcium 9.0 8.9 - 95.6 mg/dL   GFR calc non Af Amer >60 >60 mL/min   GFR calc Af Amer >60 >60 mL/min   Anion gap 6 5 - 15  Brain natriuretic peptide  Result Value Ref Range   B Natriuretic Peptide 247.1 (H) 0.0 - 100.0 pg/mL  I-stat troponin, ED  (not at [facility])  Result Value Ref Range   Troponin i, poc 0.00 0.00 - 0.08 ng/mL   Comment 3           Imaging Review Dg Chest 2 View  05/16/2014   CLINICAL DATA:  Initial evaluation for acute chest pain with shortness of breath.  EXAM: CHEST  2 VIEW  COMPARISON:  Prior radiograph from 01/10/2014  FINDINGS: Cardiomegaly is stable. Mediastinal silhouette within normal limits.  The lungs are normally inflated. No airspace consolidation, pleural effusion, or pulmonary edema is identified. There is no pneumothorax.  No acute osseous abnormality identified.  IMPRESSION: 1. Stable cardiomegaly without pulmonary edema. 2. No other active cardiopulmonary disease.   Electronically Signed   By: Rise MuBenjamin  McClintock M.D.   On: 05/16/2014 05:58     EKG Interpretation   Date/Time:  Monday May 16 2014 03:57:18 EDT Ventricular Rate:  103 PR Interval:  145 QRS Duration: 168 QT Interval:  431 QTC Calculation: 564 R Axis:   112 Text Interpretation:  Sinus tachycardia Anterior infarct, old Prolonged QT  interval Baseline wander in lead(s) V3 Left bundle branch block When  compared with ECG of 04/12/2014, No significant change was found Confirmed  by [facility]  MD, Zacari Radick ([id number]) on 05/16/2014 4:00:12 AM      MDM   Final diagnoses:  Chest pain, unspecified chest pain type  Headache, unspecified headache type  Systolic CHF with reduced left ventricular function, NYHA class 3  Essential hypertension  Noncompliance with medication regimen    Exertional  dyspnea and paroxysmal nocturnal dyspnea which probably represents an exacerbation of patient's known heart failure. Hypertension is related to medication noncompliance. Headache may be a migraine variant but could also be related to uncontrolled hypertension. Old records are reviewed and she has had issues with medication noncompliance in the past and carries a diagnosis of systolic heart failure which is [place] class 2-3. Troponin is come back normal and ECG shows left bundle branch block, unchanged from baseline. With heart failure history and some evidence of edema, I am hesitant to give the classic headache cocktail. She will be given labetalol for hypertension as well as being restarted on carvedilol and lisinopril. If headache does not respond to this, will give metoclopramide.  There is only slight response of the headache to metoclopramide says she is given a dose of morphine and discharged. She is given new prescriptions for her carvedilol and lisinopril and furosemide and also given a prescription for tramadol to use if her headache recurs. She is instructed to make a follow-up appointment with cardiology and make sure that she does not miss appointments with them.  Dione Boozeavid Novalynn Branaman, MD 05/16/14 [phone]

## 2014-05-16 NOTE — ED Notes (Signed)
Awake. Verbally responsive. A/O x4. Resp even and unlabored. No audible adventitious breath sounds noted. ABC's intact.  

## 2014-05-16 NOTE — ED Notes (Addendum)
Awake. Verbally responsive. A/O x4. Resp even and unlabored. No audible adventitious breath sounds noted. ABC's intact. SR on monitor. Pt reported having chest, head and bil leg pain. Pt ambulated to BR with steady gait. MD aware of elevated BP's and instructed for pt take home BP medication. Pt aware and verbalized understanding.

## 2014-05-16 NOTE — ED Notes (Signed)
Pt presents with c/o central chest pain that started a couple of days ago. Pt reports she has had this pain before. Pt also reports some associated shortness of breath with the pain. Pt also c/o headache that has been going on for 4-5 days.

## 2014-07-12 ENCOUNTER — Encounter (HOSPITAL_COMMUNITY): Payer: Self-pay | Admitting: Emergency Medicine

## 2014-07-12 ENCOUNTER — Emergency Department (HOSPITAL_COMMUNITY): Payer: Medicaid Other

## 2014-07-12 ENCOUNTER — Emergency Department (HOSPITAL_COMMUNITY)
Admission: EM | Admit: 2014-07-12 | Discharge: 2014-07-12 | Disposition: A | Discharge status: Home / Self Care | Payer: Medicaid Other | Attending: Emergency Medicine | Admitting: Emergency Medicine

## 2014-07-12 DIAGNOSIS — Z79899 Other long term (current) drug therapy: Secondary | ICD-10-CM | POA: Insufficient documentation

## 2014-07-12 DIAGNOSIS — G8929 Other chronic pain: Secondary | ICD-10-CM | POA: Insufficient documentation

## 2014-07-12 DIAGNOSIS — Z9119 Patient's noncompliance with other medical treatment and regimen: Secondary | ICD-10-CM | POA: Insufficient documentation

## 2014-07-12 DIAGNOSIS — M79641 Pain in right hand: Secondary | ICD-10-CM

## 2014-07-12 DIAGNOSIS — I10 Essential (primary) hypertension: Secondary | ICD-10-CM | POA: Insufficient documentation

## 2014-07-12 DIAGNOSIS — M5430 Sciatica, unspecified side: Secondary | ICD-10-CM | POA: Insufficient documentation

## 2014-07-12 DIAGNOSIS — R52 Pain, unspecified: Secondary | ICD-10-CM

## 2014-07-12 DIAGNOSIS — Z9889 Other specified postprocedural states: Secondary | ICD-10-CM | POA: Insufficient documentation

## 2014-07-12 DIAGNOSIS — Z87891 Personal history of nicotine dependence: Secondary | ICD-10-CM | POA: Insufficient documentation

## 2014-07-12 DIAGNOSIS — Z862 Personal history of diseases of the blood and blood-forming organs and certain disorders involving the immune mechanism: Secondary | ICD-10-CM | POA: Insufficient documentation

## 2014-07-12 DIAGNOSIS — I5023 Acute on chronic systolic (congestive) heart failure: Secondary | ICD-10-CM | POA: Insufficient documentation

## 2014-07-12 DIAGNOSIS — M79642 Pain in left hand: Secondary | ICD-10-CM | POA: Insufficient documentation

## 2014-07-12 MED ORDER — PREDNISONE 50 MG PO TABS: 50.0000 mg | ORAL_TABLET | Freq: Every day | ORAL | Status: DC

## 2014-07-12 MED ORDER — KETOROLAC TROMETHAMINE 60 MG/2ML IM SOLN
60.0000 mg | Freq: Once | INTRAMUSCULAR | Status: AC
  Administered 2014-07-12: 60 mg via INTRAMUSCULAR
  Filled 2014-07-12: qty 2

## 2014-07-12 MED ORDER — TRAMADOL HCL 50 MG PO TABS: 50.0000 mg | ORAL_TABLET | Freq: Four times a day (QID) | ORAL | Status: DC | PRN

## 2014-07-12 NOTE — ED Notes (Signed)
Lawyer at bedside.

## 2014-07-12 NOTE — ED Notes (Addendum)
Pt reports acute chronic bilateral hand pain worsening for a week; no injury; denies other complaint of pain; pt also reports hx of high blood pressure and "has not taken medication for it in a few days."

## 2014-07-12 NOTE — ED Notes (Addendum)
[facility] aware awaiting [facility] results.

## 2014-07-12 NOTE — ED Notes (Signed)
RN notified of BP of 191/117

## 2014-07-12 NOTE — Discharge Instructions (Signed)
Return here as needed.  Follow-up to primary care Dr. for recheck.  The x-rays did not show any abnormality.  You can also follow-up with the Hand Doctor provided

## 2014-07-12 NOTE — ED Provider Notes (Signed)
CSN: [id number]     Arrival date & time 07/12/14  8657 History   First MD Initiated Contact with Patient 07/12/14 0757     Chief Complaint  Patient presents with  . Hand Pain  . Hypertension     (Consider location/radiation/quality/duration/timing/severity/associated sxs/prior Treatment) HPI Patient presents to the emergency department with bilateral hand pain that started a while ago, but is worse over the last week.  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  Patient states that movement and palpation make the pain worse.  The patient denies fever, nausea, vomiting, weakness, dizziness, headache, blurred vision, back pain, neck pain, chest pain, shortness of breath or syncope.  The patient states that he took ibuprofen and Goody's without relief of her symptoms Past Medical History  Diagnosis Date  . Hypertension   . Chronic systolic heart failure 05/2011    a. NICM b. 5/13 LHC: nl cors c. 7/14 ECHO: EF 35-40% diff HK, MV calcified annulus, LA mild dil, MV triv regur, TV triv regur  . Anemia   . Left bundle branch block   . Cardiomyopathy   . Iron deficiency anemia   . Headache   . Non-compliance   . Acute on chronic systolic heart failure   . Systolic congestive heart failure     due to HTN  . 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 Chronic back pain   . Sciatica   . Chest wall pain   . Headache    Past Surgical History  Procedure Laterality Date  . Cesarean section    . Cholecystectomy    . Tubal ligation    . Left and right heart catheterization with coronary angiogram N/A 06/10/2011    Procedure: LEFT AND RIGHT HEART CATHETERIZATION WITH CORONARY ANGIOGRAM;  Surgeon: Dolores Patty, MD;  Location: [facility] CATH LAB;  Service: Cardiovascular;  Laterality: N/A;  . Cardiac catheterization  2013    normal coronary arteries   Family History  Problem Relation Age of Onset  .  Heart failure Mother   . Hypertension Mother    History  Substance Use Topics  . Smoking status: Former Games developer  . Smokeless tobacco: Former Neurosurgeon    Quit date: 06/06/2006  . Alcohol Use: No   OB History    No data available     Review of Systems  All other systems negative except as documented in the HPI. All pertinent positives and negatives as reviewed in the HPI.  Allergies  Doxycycline  Home Medications   Prior to Admission medications   Medication Sig Start Date End Date Taking? Authorizing Provider  acetaminophen (TYLENOL) 500 MG tablet Take 1,000 mg by mouth every 6 (six) hours as needed for moderate pain or headache.   Yes Historical Provider, MD  carvedilol (COREG) 12.5 MG tablet Take 1 tablet (12.5 mg total) by mouth daily. 05/16/14  Yes Dione Booze, MD  diphenhydrAMINE (BENADRYL) 25 mg capsule Take 25-50 mg by mouth every 6 (six) hours as needed for allergies (allergies).    Yes Historical Provider, MD  furosemide (LASIX) 40 MG tablet Take 1 tablet (40 mg total) by mouth daily. 05/16/14  Yes Dione Booze, MD  ibuprofen (ADVIL,MOTRIN) 200 MG tablet Take 400-600 mg by mouth every 6 (six) hours as needed for headache or moderate pain.   Yes Historical Provider, MD  lisinopril (PRINIVIL,ZESTRIL) 20 MG tablet Take 1 tablet (20  mg total) by mouth daily. 05/16/14  Yes Dione Boozeavid Glick, MD  potassium chloride SA (K-DUR,KLOR-CON) 20 MEQ tablet Take 20 mEq by mouth 2 (two) times daily.   Yes Historical Provider, MD  spironolactone (ALDACTONE) 25 MG tablet Take 25 mg by mouth daily.    Yes Historical Provider, MD  traMADol (ULTRAM) 50 MG tablet Take 1 tablet (50 mg total) by mouth every 6 (six) hours as needed. Patient not taking: Reported on 07/12/2014 05/16/14   Dione Boozeavid Glick, MD   BP 159/106 mmHg  Pulse 90  Temp(Src) 98.7 F (37.1 C) (Oral)  Resp 18  Ht 5\' 5"  (1.651 m)  Wt 200 lb (90.719 kg)  BMI 33.28 kg/m2  SpO2 99%  LMP 06/22/2014 Physical Exam  Constitutional: She is oriented to  person, place, and time. She appears well-developed and well-nourished. No distress.  HENT:  Head: Normocephalic and atraumatic.  Mouth/Throat: Oropharynx is clear and moist.  Eyes: Pupils are equal, round, and reactive to light.  Neck: Normal range of motion. Neck supple.  Cardiovascular: Normal rate, regular rhythm and normal heart sounds.  Exam reveals no gallop and no friction rub.   No murmur heard. Pulmonary/Chest: Effort normal and breath sounds normal. No respiratory distress.  Musculoskeletal:       Right hand: She exhibits tenderness. She exhibits normal two-point discrimination, normal capillary refill, no deformity and no swelling. Normal sensation noted. Normal strength noted.       Hands: Neurological: She is alert and oriented to person, place, and time. She exhibits normal muscle tone. Coordination normal.  Skin: Skin is warm and dry. No rash noted. No erythema.  Nursing note and vitals reviewed.   ED Course  Procedures (including critical care time) Labs Review Labs Reviewed - No data to display  Imaging Review Dg Hand Complete Right  07/12/2014   CLINICAL DATA:  Pain for 1 week.  No history of trauma  EXAM: RIGHT HAND - COMPLETE 3+ VIEW  COMPARISON:  October 24, 2010  FINDINGS: Frontal, oblique, and lateral views were obtained. There is no fracture or dislocation. Joint spaces appear intact. No erosive change.  IMPRESSION: No fracture or dislocation.  No appreciable arthropathy.   Electronically Signed   By: Bretta BangWilliam  Woodruff III M.D.   On: 07/12/2014 08:57    Patient is advised to follow-up with the orthopedist provided.  Told to return here as needed.  She is also advised to follow-up with a primary care Dr. for further evaluation    Charlestine NightChristopher Raphel Stickles, PA-C 07/12/14 0930  Lorre NickAnthony Allen, MD 07/15/14 2312

## 2014-08-03 ENCOUNTER — Inpatient Hospital Stay (HOSPITAL_COMMUNITY): Admission: RE | Admit: 2014-08-03 | Payer: Medicaid Other | Source: Ambulatory Visit

## 2014-08-03 ENCOUNTER — Other Ambulatory Visit (HOSPITAL_COMMUNITY): Payer: Self-pay | Admitting: *Deleted

## 2014-08-03 ENCOUNTER — Encounter (HOSPITAL_COMMUNITY): Payer: Self-pay | Admitting: Vascular Surgery

## 2014-08-03 MED ORDER — CARVEDILOL 12.5 MG PO TABS: 12.5000 mg | ORAL_TABLET | Freq: Every day | ORAL | Status: DC

## 2014-08-03 MED ORDER — LISINOPRIL 20 MG PO TABS: 20.0000 mg | ORAL_TABLET | Freq: Every day | ORAL | Status: DC

## 2014-08-03 MED ORDER — FUROSEMIDE 40 MG PO TABS: 40.0000 mg | ORAL_TABLET | Freq: Every day | ORAL | Status: DC

## 2014-08-03 MED ORDER — SPIRONOLACTONE 25 MG PO TABS: 25.0000 mg | ORAL_TABLET | Freq: Every day | ORAL | Status: DC

## 2014-08-24 ENCOUNTER — Other Ambulatory Visit (HOSPITAL_COMMUNITY): Payer: Self-pay

## 2014-08-24 ENCOUNTER — Telehealth (HOSPITAL_COMMUNITY): Payer: Self-pay | Admitting: Vascular Surgery

## 2014-08-24 ENCOUNTER — Inpatient Hospital Stay (HOSPITAL_COMMUNITY): Admission: RE | Admit: 2014-08-24 | Payer: Medicaid Other | Source: Ambulatory Visit

## 2014-08-24 MED ORDER — LISINOPRIL 20 MG PO TABS: 20.0000 mg | ORAL_TABLET | Freq: Every day | ORAL | Status: DC

## 2014-08-24 MED ORDER — FUROSEMIDE 40 MG PO TABS: 40.0000 mg | ORAL_TABLET | Freq: Every day | ORAL | Status: DC

## 2014-08-24 MED ORDER — CARVEDILOL 12.5 MG PO TABS: 12.5000 mg | ORAL_TABLET | Freq: Every day | ORAL | Status: DC

## 2014-08-24 MED ORDER — SPIRONOLACTONE 25 MG PO TABS: 25.0000 mg | ORAL_TABLET | Freq: Every day | ORAL | Status: DC

## 2014-08-24 NOTE — Telephone Encounter (Signed)
Called pt to confirm her appt, pt has a lot of no shows

## 2014-08-24 NOTE — Telephone Encounter (Signed)
Patient continues to cancel and no-show for her follow up appointments at the CHF clinic and has not been seen in 3 YEARS.  Per our providers, her LAST 30 day refill has been sent in today on her HF medications.  DO NOT refill them until she completes an appointment with [place].  Patient is aware and agrees to this.  Appointment made for 09/19/2014 at 2:20, she is aware she MUST come to this appointment to get any further refills.  Ave Filter

## 2014-09-19 ENCOUNTER — Inpatient Hospital Stay (HOSPITAL_COMMUNITY): Admission: RE | Admit: 2014-09-19 | Payer: Medicaid Other | Source: Ambulatory Visit

## 2014-10-14 ENCOUNTER — Emergency Department (HOSPITAL_COMMUNITY)
Admission: EM | Admit: 2014-10-14 | Discharge: 2014-10-14 | Disposition: A | Discharge status: Home / Self Care | Payer: Medicaid Other | Attending: Physician Assistant | Admitting: Physician Assistant

## 2014-10-14 ENCOUNTER — Encounter (HOSPITAL_COMMUNITY): Payer: Self-pay | Admitting: Emergency Medicine

## 2014-10-14 DIAGNOSIS — Z202 Contact with and (suspected) exposure to infections with a predominantly sexual mode of transmission: Secondary | ICD-10-CM | POA: Insufficient documentation

## 2014-10-14 DIAGNOSIS — Z711 Person with feared health complaint in whom no diagnosis is made: Secondary | ICD-10-CM

## 2014-10-14 DIAGNOSIS — Z3202 Encounter for pregnancy test, result negative: Secondary | ICD-10-CM | POA: Insufficient documentation

## 2014-10-14 DIAGNOSIS — Z79899 Other long term (current) drug therapy: Secondary | ICD-10-CM | POA: Insufficient documentation

## 2014-10-14 DIAGNOSIS — R300 Dysuria: Secondary | ICD-10-CM | POA: Insufficient documentation

## 2014-10-14 DIAGNOSIS — Z862 Personal history of diseases of the blood and blood-forming organs and certain disorders involving the immune mechanism: Secondary | ICD-10-CM | POA: Insufficient documentation

## 2014-10-14 DIAGNOSIS — Z87891 Personal history of nicotine dependence: Secondary | ICD-10-CM | POA: Insufficient documentation

## 2014-10-14 DIAGNOSIS — G8929 Other chronic pain: Secondary | ICD-10-CM | POA: Insufficient documentation

## 2014-10-14 DIAGNOSIS — I110 Hypertensive heart disease with heart failure: Secondary | ICD-10-CM | POA: Insufficient documentation

## 2014-10-14 DIAGNOSIS — Z9889 Other specified postprocedural states: Secondary | ICD-10-CM | POA: Insufficient documentation

## 2014-10-14 DIAGNOSIS — I5023 Acute on chronic systolic (congestive) heart failure: Secondary | ICD-10-CM | POA: Insufficient documentation

## 2014-10-14 DIAGNOSIS — Z8739 Personal history of other diseases of the musculoskeletal system and connective tissue: Secondary | ICD-10-CM | POA: Insufficient documentation

## 2014-10-14 DIAGNOSIS — N898 Other specified noninflammatory disorders of vagina: Secondary | ICD-10-CM | POA: Insufficient documentation

## 2014-10-14 DIAGNOSIS — R3915 Urgency of urination: Secondary | ICD-10-CM | POA: Insufficient documentation

## 2014-10-14 LAB — URINALYSIS, ROUTINE W REFLEX MICROSCOPIC
Bilirubin Urine: NEGATIVE
GLUCOSE, UA: NEGATIVE mg/dL
HGB URINE DIPSTICK: NEGATIVE
Ketones, ur: NEGATIVE mg/dL
Leukocytes, UA: NEGATIVE
Nitrite: NEGATIVE
PROTEIN: NEGATIVE mg/dL
Specific Gravity, Urine: 1.017 (ref 1.005–1.030)
UROBILINOGEN UA: 1 mg/dL (ref 0.0–1.0)
pH: 7.5 (ref 5.0–8.0)

## 2014-10-14 LAB — PREGNANCY, URINE: Preg Test, Ur: NEGATIVE

## 2014-10-14 LAB — WET PREP, GENITAL
Clue Cells Wet Prep HPF POC: NONE SEEN
Trich, Wet Prep: NONE SEEN
Yeast Wet Prep HPF POC: NONE SEEN

## 2014-10-14 MED ORDER — CEFTRIAXONE SODIUM 250 MG IJ SOLR
250.0000 mg | Freq: Once | INTRAMUSCULAR | Status: AC
  Administered 2014-10-14: 250 mg via INTRAMUSCULAR
  Filled 2014-10-14: qty 250

## 2014-10-14 MED ORDER — AZITHROMYCIN 250 MG PO TABS
1000.0000 mg | ORAL_TABLET | Freq: Once | ORAL | Status: AC
  Administered 2014-10-14: 1000 mg via ORAL
  Filled 2014-10-14: qty 4

## 2014-10-14 MED ORDER — LIDOCAINE HCL (PF) 1 % IJ SOLN
INTRAMUSCULAR | Status: AC
  Filled 2014-10-14: qty 5

## 2014-10-14 MED ORDER — ONDANSETRON 4 MG PO TBDP
4.0000 mg | ORAL_TABLET | Freq: Once | ORAL | Status: AC
  Administered 2014-10-14: 4 mg via ORAL
  Filled 2014-10-14: qty 1

## 2014-10-14 NOTE — ED Notes (Signed)
Pt reports she has been having urinating in small amounts more often than usual. Pt also reports white vaginal discharge. Partner was recently treated for STDs. Pt denies dysuria or blood in urine

## 2014-10-14 NOTE — ED Provider Notes (Addendum)
CSN: [id number]     Arrival date & time 10/14/14  1191 History   First MD Initiated Contact with Patient 10/14/14 [phone]     Chief Complaint  Patient presents with  . Urinary Frequency  . Vaginal Discharge     (Consider location/radiation/quality/duration/timing/severity/associated sxs/prior Treatment) HPI   Patient is a pleasant 56 old female presenting for STD check. Patient states that her partner was recently treated for an unknown STD. Patient states she been having a little bit urinary frequency. She states a little bit of burning with urination. Also a white discharge. Neither thick nor thin, musky odor.. No fevers nausea vomiting.  Past Medical History  Diagnosis Date  . Hypertension   . Chronic systolic heart failure (HCC) 05/2011    a. NICM b. 5/13 LHC: nl cors c. 7/14 ECHO: EF 35-40% diff HK, MV calcified annulus, LA mild dil, MV triv regur, TV triv regur  . Anemia   . Left bundle branch block   . Cardiomyopathy (HCC)   . Iron deficiency anemia   . Headache   . Non-compliance   . Acute on chronic systolic heart failure (HCC)   . Systolic congestive heart failure (HCC)     due to HTN  . 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 Chronic back pain   . Sciatica   . Chest wall pain   . Headache    Past Surgical History  Procedure Laterality Date  . Cesarean section    . Cholecystectomy    . Tubal ligation    . Left and right heart catheterization with coronary angiogram N/A 06/10/2011    Procedure: LEFT AND RIGHT HEART CATHETERIZATION WITH CORONARY ANGIOGRAM;  Surgeon: Dolores Patty, MD;  Location: [facility] CATH LAB;  Service: Cardiovascular;  Laterality: N/A;  . Cardiac catheterization  2013    normal coronary arteries   Family History  Problem Relation Age of Onset  . Heart failure Mother   . Hypertension Mother    Social History  Substance Use Topics  . Smoking status: Former Games developer  . Smokeless tobacco: Former Neurosurgeon    Quit date:  06/06/2006  . Alcohol Use: No   OB History    No data available     Review of Systems  Constitutional: Negative for activity change and fatigue.  HENT: Negative for congestion and drooling.   Eyes: Negative for discharge.  Respiratory: Negative for cough and chest tightness.   Cardiovascular: Negative for chest pain.  Gastrointestinal: Negative for abdominal distention.  Genitourinary: Positive for dysuria, urgency and frequency. Negative for hematuria, decreased urine volume, vaginal bleeding, vaginal discharge, difficulty urinating, genital sores, menstrual problem and pelvic pain.  Musculoskeletal: Negative for joint swelling.  Skin: Negative for rash.  Allergic/Immunologic: Negative for immunocompromised state.  Psychiatric/Behavioral: Negative for behavioral problems and agitation.      Allergies  Doxycycline  Home Medications   Prior to Admission medications   Medication Sig Start Date End Date Taking? Authorizing Provider  acetaminophen (TYLENOL) 500 MG tablet Take 1,000 mg by mouth every 6 (six) hours as needed for moderate pain or headache.    Historical Provider, MD  carvedilol (COREG) 12.5 MG tablet Take 1 tablet (12.5 mg total) by mouth daily. NO MORE REFILLS UNTIL YOU COMPLETE AN APPOINTMENT WITH CHF CLINIC. 08/24/14   Laurey Morale, MD  diphenhydrAMINE (BENADRYL) 25 mg capsule Take 25-50 mg by mouth every 6 (six) hours as needed for allergies (allergies).     Historical Provider,  MD  furosemide (LASIX) 40 MG tablet Take 1 tablet (40 mg total) by mouth daily. NO MORE REFILLS UNTIL YOU COMPLETE AN APPOINTMENT WITH CHF CLINIC. 08/24/14   Laurey Morale, MD  ibuprofen (ADVIL,MOTRIN) 200 MG tablet Take 400-600 mg by mouth every 6 (six) hours as needed for headache or moderate pain.    Historical Provider, MD  lisinopril (PRINIVIL,ZESTRIL) 20 MG tablet Take 1 tablet (20 mg total) by mouth daily. NO MORE REFILLS UNTIL YOU COMPLETE AN APPOINTMENT WITH CHF CLINIC. 08/24/14    Laurey Morale, MD  potassium chloride SA (K-DUR,KLOR-CON) 20 MEQ tablet Take 20 mEq by mouth 2 (two) times daily.    Historical Provider, MD  predniSONE (DELTASONE) 50 MG tablet Take 1 tablet (50 mg total) by mouth daily with breakfast. 07/12/14   Charlestine Night, PA-C  spironolactone (ALDACTONE) 25 MG tablet Take 1 tablet (25 mg total) by mouth daily. NO MORE REFILLS UNTIL YOU COMPLETE AN APPOINTMENT WITH CHF CLINIC. 08/24/14   Laurey Morale, MD  traMADol (ULTRAM) 50 MG tablet Take 1 tablet (50 mg total) by mouth every 6 (six) hours as needed. 07/12/14   Christopher Lawyer, PA-C   BP 179/119 mmHg  Pulse 86  Temp(Src) 98.1 F (36.7 C) (Oral)  Resp 20  SpO2 97% Physical Exam  Constitutional: She is oriented to person, place, and time. She appears well-developed and well-nourished.  HENT:  Head: Normocephalic and atraumatic.  Eyes: Conjunctivae are normal. Right eye exhibits no discharge.  Neck: Neck supple.  Cardiovascular: Normal rate, regular rhythm and normal heart sounds.   No murmur heard. Pulmonary/Chest: Effort normal and breath sounds normal. She has no wheezes. She has no rales.  Abdominal: Soft. She exhibits no distension. There is no tenderness.  Genitourinary:  Small yellow discharge at cervical os. No cervical motion tenderness  Musculoskeletal: Normal range of motion. She exhibits no edema.  Neurological: She is oriented to person, place, and time. No cranial nerve deficit.  Skin: Skin is warm and dry. No rash noted. She is not diaphoretic.  Psychiatric: She has a normal mood and affect. Her behavior is normal.  Nursing note and vitals reviewed.   ED Course  Procedures (including critical care time) Labs Review Labs Reviewed  WET PREP, GENITAL  URINALYSIS, ROUTINE W REFLEX MICROSCOPIC (NOT AT [facility])  PREGNANCY, URINE  RPR  HIV ANTIBODY (ROUTINE TESTING)  GC/CHLAMYDIA PROBE AMP ([place]) NOT AT [facility]    Imaging Review No results found. I have personally  reviewed and evaluated these images and lab results as part of my medical decision-making.   EKG Interpretation None      MDM   Final diagnoses:  None    Patient's 67 old female presenting for STD check. Patient's partner was treated presumptively for STDs yesterday. Patient also has urinary frequency. We'll check her urine.  We'll treat presumptively for STDs as well as send cultures. Awaiting pregnancy test.  Abelino Derrick, MD 10/14/14 1610  Bryton Waight Randall An, MD 10/14/14 9604

## 2014-10-14 NOTE — Discharge Instructions (Signed)
Please return with any fever, pain with sex, increasing discharge, or other concerns.

## 2014-10-15 LAB — HIV ANTIBODY (ROUTINE TESTING W REFLEX): HIV SCREEN 4TH GENERATION: NONREACTIVE

## 2014-10-15 LAB — RPR: RPR: NONREACTIVE

## 2014-10-17 LAB — GC/CHLAMYDIA PROBE AMP (~~LOC~~) NOT AT ARMC
CHLAMYDIA, DNA PROBE: NEGATIVE
NEISSERIA GONORRHEA: POSITIVE — AB

## 2014-10-18 ENCOUNTER — Telehealth (HOSPITAL_BASED_OUTPATIENT_CLINIC_OR_DEPARTMENT_OTHER): Payer: Self-pay | Admitting: Emergency Medicine

## 2015-01-01 ENCOUNTER — Encounter (HOSPITAL_COMMUNITY): Payer: Self-pay | Admitting: Emergency Medicine

## 2015-01-01 ENCOUNTER — Inpatient Hospital Stay (HOSPITAL_COMMUNITY)
Admission: EM | Admit: 2015-01-01 | Discharge: 2015-01-04 | DRG: 292 | Disposition: A | Discharge status: Home / Self Care | Payer: Self-pay | Attending: Internal Medicine | Admitting: Internal Medicine

## 2015-01-01 ENCOUNTER — Emergency Department (HOSPITAL_COMMUNITY): Payer: Self-pay

## 2015-01-01 DIAGNOSIS — I10 Essential (primary) hypertension: Secondary | ICD-10-CM | POA: Diagnosis present

## 2015-01-01 DIAGNOSIS — Z91199 Patient's noncompliance with other medical treatment and regimen due to unspecified reason: Secondary | ICD-10-CM

## 2015-01-01 DIAGNOSIS — N179 Acute kidney failure, unspecified: Secondary | ICD-10-CM | POA: Diagnosis present

## 2015-01-01 DIAGNOSIS — I509 Heart failure, unspecified: Secondary | ICD-10-CM

## 2015-01-01 DIAGNOSIS — I429 Cardiomyopathy, unspecified: Secondary | ICD-10-CM | POA: Diagnosis present

## 2015-01-01 DIAGNOSIS — I447 Left bundle-branch block, unspecified: Secondary | ICD-10-CM | POA: Diagnosis present

## 2015-01-01 DIAGNOSIS — Z8249 Family history of ischemic heart disease and other diseases of the circulatory system: Secondary | ICD-10-CM

## 2015-01-01 DIAGNOSIS — D509 Iron deficiency anemia, unspecified: Secondary | ICD-10-CM | POA: Diagnosis present

## 2015-01-01 DIAGNOSIS — I110 Hypertensive heart disease with heart failure: Secondary | ICD-10-CM | POA: Diagnosis present

## 2015-01-01 DIAGNOSIS — Z87891 Personal history of nicotine dependence: Secondary | ICD-10-CM

## 2015-01-01 DIAGNOSIS — Z9119 Patient's noncompliance with other medical treatment and regimen: Secondary | ICD-10-CM

## 2015-01-01 DIAGNOSIS — I5023 Acute on chronic systolic (congestive) heart failure: Principal | ICD-10-CM | POA: Diagnosis present

## 2015-01-01 LAB — BASIC METABOLIC PANEL
ANION GAP: 10 (ref 5–15)
BUN: 7 mg/dL (ref 6–20)
CALCIUM: 9.5 mg/dL (ref 8.9–10.3)
CO2: 28 mmol/L (ref 22–32)
Chloride: 106 mmol/L (ref 101–111)
Creatinine, Ser: 0.74 mg/dL (ref 0.44–1.00)
Glucose, Bld: 100 mg/dL — ABNORMAL HIGH (ref 65–99)
POTASSIUM: 3 mmol/L — AB (ref 3.5–5.1)
SODIUM: 144 mmol/L (ref 135–145)

## 2015-01-01 LAB — I-STAT TROPONIN, ED: TROPONIN I, POC: 0.04 ng/mL (ref 0.00–0.08)

## 2015-01-01 LAB — CBC
HEMATOCRIT: 33.6 % — AB (ref 36.0–46.0)
HEMOGLOBIN: 11 g/dL — AB (ref 12.0–15.0)
MCH: 27.8 pg (ref 26.0–34.0)
MCHC: 32.7 g/dL (ref 30.0–36.0)
MCV: 84.8 fL (ref 78.0–100.0)
Platelets: 214 10*3/uL (ref 150–400)
RBC: 3.96 MIL/uL (ref 3.87–5.11)
RDW: 15.5 % (ref 11.5–15.5)
WBC: 6.6 10*3/uL (ref 4.0–10.5)

## 2015-01-01 LAB — BRAIN NATRIURETIC PEPTIDE: B NATRIURETIC PEPTIDE 5: 1241.7 pg/mL — AB (ref 0.0–100.0)

## 2015-01-01 LAB — SEDIMENTATION RATE: SED RATE: 10 mm/h (ref 0–22)

## 2015-01-01 LAB — TROPONIN I: TROPONIN I: 0.03 ng/mL (ref <0.031)

## 2015-01-01 MED ORDER — CARVEDILOL 12.5 MG PO TABS
12.5000 mg | ORAL_TABLET | Freq: Every day | ORAL | Status: DC
  Administered 2015-01-01: 12.5 mg via ORAL
  Administered 2015-01-02: 6.25 mg via ORAL
  Filled 2015-01-01 (×2): qty 1

## 2015-01-01 MED ORDER — POTASSIUM CHLORIDE 10 MEQ/100ML IV SOLN
10.0000 meq | Freq: Once | INTRAVENOUS | Status: DC
  Filled 2015-01-01: qty 100

## 2015-01-01 MED ORDER — HYDRALAZINE HCL 20 MG/ML IJ SOLN
10.0000 mg | Freq: Four times a day (QID) | INTRAMUSCULAR | Status: DC | PRN
  Administered 2015-01-01: 10 mg via INTRAVENOUS
  Filled 2015-01-01: qty 1

## 2015-01-01 MED ORDER — MORPHINE SULFATE (PF) 4 MG/ML IV SOLN
4.0000 mg | Freq: Once | INTRAVENOUS | Status: AC
  Administered 2015-01-01: 4 mg via INTRAVENOUS
  Filled 2015-01-01: qty 1

## 2015-01-01 MED ORDER — POTASSIUM CHLORIDE CRYS ER 20 MEQ PO TBCR
40.0000 meq | EXTENDED_RELEASE_TABLET | Freq: Two times a day (BID) | ORAL | Status: DC
  Administered 2015-01-01 – 2015-01-04 (×6): 40 meq via ORAL
  Filled 2015-01-01 (×6): qty 2

## 2015-01-01 MED ORDER — ENOXAPARIN SODIUM 30 MG/0.3ML ~~LOC~~ SOLN: 30.0000 mg | Freq: Two times a day (BID) | SUBCUTANEOUS | Status: DC

## 2015-01-01 MED ORDER — SODIUM CHLORIDE 0.9 % IV SOLN: 250.0000 mL | INTRAVENOUS | Status: DC | PRN

## 2015-01-01 MED ORDER — LISINOPRIL 20 MG PO TABS
20.0000 mg | ORAL_TABLET | Freq: Every day | ORAL | Status: DC
  Administered 2015-01-01: 20 mg via ORAL
  Filled 2015-01-01 (×2): qty 1

## 2015-01-01 MED ORDER — ACETAMINOPHEN 325 MG PO TABS
650.0000 mg | ORAL_TABLET | ORAL | Status: DC | PRN
  Administered 2015-01-04: 650 mg via ORAL
  Filled 2015-01-01 (×2): qty 2

## 2015-01-01 MED ORDER — SODIUM CHLORIDE 0.9 % IJ SOLN
3.0000 mL | Freq: Two times a day (BID) | INTRAMUSCULAR | Status: DC
  Administered 2015-01-01 – 2015-01-03 (×5): 3 mL via INTRAVENOUS

## 2015-01-01 MED ORDER — ENOXAPARIN SODIUM 40 MG/0.4ML ~~LOC~~ SOLN
40.0000 mg | SUBCUTANEOUS | Status: DC
  Administered 2015-01-01 – 2015-01-03 (×3): 40 mg via SUBCUTANEOUS
  Filled 2015-01-01 (×4): qty 0.4

## 2015-01-01 MED ORDER — SODIUM CHLORIDE 0.9 % IJ SOLN: 3.0000 mL | INTRAMUSCULAR | Status: DC | PRN

## 2015-01-01 MED ORDER — FUROSEMIDE 10 MG/ML IJ SOLN
40.0000 mg | INTRAMUSCULAR | Status: AC
  Administered 2015-01-01: 40 mg via INTRAVENOUS
  Filled 2015-01-01: qty 4

## 2015-01-01 MED ORDER — FUROSEMIDE 10 MG/ML IJ SOLN
40.0000 mg | Freq: Three times a day (TID) | INTRAMUSCULAR | Status: DC
  Administered 2015-01-01 – 2015-01-02 (×2): 40 mg via INTRAVENOUS
  Filled 2015-01-01 (×3): qty 4

## 2015-01-01 MED ORDER — POTASSIUM CHLORIDE CRYS ER 20 MEQ PO TBCR
40.0000 meq | EXTENDED_RELEASE_TABLET | Freq: Once | ORAL | Status: AC
Start: 2015-01-01 — End: 2015-01-01
  Administered 2015-01-01: 40 meq via ORAL
  Filled 2015-01-01: qty 2

## 2015-01-01 MED ORDER — ONDANSETRON HCL 4 MG/2ML IJ SOLN
4.0000 mg | Freq: Four times a day (QID) | INTRAMUSCULAR | Status: DC | PRN
  Administered 2015-01-01: 4 mg via INTRAVENOUS
  Filled 2015-01-01: qty 2

## 2015-01-01 MED ORDER — TRAMADOL HCL 50 MG PO TABS
50.0000 mg | ORAL_TABLET | Freq: Two times a day (BID) | ORAL | Status: DC | PRN
  Administered 2015-01-01 – 2015-01-02 (×2): 50 mg via ORAL
  Filled 2015-01-01 (×2): qty 1

## 2015-01-01 NOTE — ED Notes (Signed)
Per pt, states SOB and chest pain along with cough since Friday

## 2015-01-01 NOTE — ED Provider Notes (Signed)
CSN: [id number]     Arrival date & time 01/01/15  1334 History   First MD Initiated Contact with Patient 01/01/15 1459     Chief Complaint  Patient presents with  . SOB/cough      (Consider location/radiation/quality/duration/timing/severity/associated sxs/prior Treatment) HPI Comments: Patient with hx of CHF and HTN presents to the ED with a chief complaint of SOB and chest pain.  She states that the SOB started on Friday and has progressively worsened until now.  She states that today she began having chest pain.  She has not been taking any of her regular medications.  She reports that her symptoms are aggravated with exertion. She states that the pain radiates to her shoulder and back.  She reports associated swelling in her legs and hands.  The history is provided by the patient. No language interpreter was used.    Past Medical History  Diagnosis Date  . Hypertension   . Chronic systolic heart failure (HCC) 05/2011    a. NICM b. 5/13 LHC: nl cors c. 7/14 ECHO: EF 35-40% diff HK, MV calcified annulus, LA mild dil, MV triv regur, TV triv regur  . Anemia   . Left bundle branch block   . Cardiomyopathy (HCC)   . Iron deficiency anemia   . Headache   . Non-compliance   . Acute on chronic systolic heart failure (HCC)   . Systolic congestive heart failure (HCC)     due to HTN  . 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 Chronic back pain   . Sciatica   . Chest wall pain   . Headache    Past Surgical History  Procedure Laterality Date  . Cesarean section    . Cholecystectomy    . Tubal ligation    . Left and right heart catheterization with coronary angiogram N/A 06/10/2011    Procedure: LEFT AND RIGHT HEART CATHETERIZATION WITH CORONARY ANGIOGRAM;  Surgeon: Dolores Patty, MD;  Location: [facility] CATH LAB;  Service: Cardiovascular;  Laterality: N/A;  . Cardiac catheterization  2013    normal coronary arteries   Family History  Problem Relation  Age of Onset  . Heart failure Mother   . Hypertension Mother    Social History  Substance Use Topics  . Smoking status: Former Games developer  . Smokeless tobacco: Former Neurosurgeon    Quit date: 06/06/2006  . Alcohol Use: No   OB History    No data available     Review of Systems  Constitutional: Negative for fever and chills.  Respiratory: Positive for shortness of breath.   Cardiovascular: Positive for chest pain.  Gastrointestinal: Negative for nausea, vomiting, diarrhea and constipation.  Genitourinary: Negative for dysuria.  All other systems reviewed and are negative.     Allergies  Doxycycline  Home Medications   Prior to Admission medications   Medication Sig Start Date End Date Taking? Authorizing Provider  acetaminophen (TYLENOL) 500 MG tablet Take 1,000 mg by mouth every 6 (six) hours as needed for moderate pain or headache.   Yes Historical Provider, MD  diphenhydrAMINE (BENADRYL) 25 mg capsule Take 25-50 mg by mouth every 6 (six) hours as needed for allergies (allergies).    Yes Historical Provider, MD  ibuprofen (ADVIL,MOTRIN) 200 MG tablet Take 400-600 mg by mouth every 6 (six) hours as needed for headache or moderate pain.   Yes Historical Provider, MD  carvedilol (COREG) 12.5 MG tablet Take 1 tablet (12.5 mg total) by mouth daily.  NO MORE REFILLS UNTIL YOU COMPLETE AN APPOINTMENT WITH CHF CLINIC. 08/24/14   Laurey Moralealton S McLean, MD  furosemide (LASIX) 40 MG tablet Take 1 tablet (40 mg total) by mouth daily. NO MORE REFILLS UNTIL YOU COMPLETE AN APPOINTMENT WITH CHF CLINIC. 08/24/14   Laurey Moralealton S McLean, MD  lisinopril (PRINIVIL,ZESTRIL) 20 MG tablet Take 1 tablet (20 mg total) by mouth daily. NO MORE REFILLS UNTIL YOU COMPLETE AN APPOINTMENT WITH CHF CLINIC. 08/24/14   Laurey Moralealton S McLean, MD  predniSONE (DELTASONE) 50 MG tablet Take 1 tablet (50 mg total) by mouth daily with breakfast. Patient not taking: Reported on 01/01/2015 07/12/14   Charlestine Nighthristopher Lawyer, PA-C  spironolactone  (ALDACTONE) 25 MG tablet Take 1 tablet (25 mg total) by mouth daily. NO MORE REFILLS UNTIL YOU COMPLETE AN APPOINTMENT WITH CHF CLINIC. 08/24/14   Laurey Moralealton S McLean, MD  traMADol (ULTRAM) 50 MG tablet Take 1 tablet (50 mg total) by mouth every 6 (six) hours as needed. Patient not taking: Reported on 01/01/2015 07/12/14   Charlestine Nighthristopher Lawyer, PA-C   BP 187/131 mmHg  Pulse 90  Temp(Src) 97.7 F (36.5 C) (Oral)  Resp 26  SpO2 97%  LMP 12/25/2014 Physical Exam  Constitutional: She is oriented to person, place, and time. She appears well-developed and well-nourished.  HENT:  Head: Normocephalic and atraumatic.  Eyes: Conjunctivae and EOM are normal. Pupils are equal, round, and reactive to light.  Neck: Normal range of motion. Neck supple.  Cardiovascular: Normal rate and regular rhythm.  Exam reveals no gallop and no friction rub.   No murmur heard. Pulmonary/Chest: Effort normal and breath sounds normal. No respiratory distress. She has no wheezes. She has no rales. She exhibits no tenderness.  Abdominal: Soft. Bowel sounds are normal. She exhibits no distension and no mass. There is no tenderness. There is no rebound and no guarding.  Musculoskeletal: Normal range of motion. She exhibits no edema or tenderness.  Neurological: She is alert and oriented to person, place, and time.  Skin: Skin is warm and dry.  Psychiatric: She has a normal mood and affect. Her behavior is normal. Judgment and thought content normal.  Nursing note and vitals reviewed.   ED Course  Procedures (including critical care time) Labs Review Labs Reviewed  BASIC METABOLIC PANEL - Abnormal; Notable for the following:    Potassium 3.0 (*)    Glucose, Bld 100 (*)    All other components within normal limits  CBC - Abnormal; Notable for the following:    Hemoglobin 11.0 (*)    HCT 33.6 (*)    All other components within normal limits  BRAIN NATRIURETIC PEPTIDE - Abnormal; Notable for the following:    B Natriuretic  Peptide 1241.7 (*)    All other components within normal limits  I-STAT TROPOININ, ED  Rosezena SensorI-STAT TROPOININ, ED    Imaging Review Dg Chest 2 View  01/01/2015  CLINICAL DATA:  Cough, congestion and shortness of breath for 1 week. New onset of chest pain today. EXAM: CHEST  2 VIEW COMPARISON:  05/16/2014 FINDINGS: Cardiomediastinal silhouette is enlarged. Mediastinal contours appear intact. There is no evidence of focal airspace consolidation, pleural effusion or pneumothorax. There is crowding of the vascular markings, suggestive of pulmonary vascular congestion. Osseous structures are without acute abnormality. Soft tissues are grossly normal. IMPRESSION: Enlarged cardiac silhouette. Pulmonary vascular congestion. Electronically Signed   By: Ted Mcalpineobrinka  Dimitrova M.D.   On: 01/01/2015 14:03   I have personally reviewed and evaluated these images and lab results  as part of my medical decision-making.   EKG Interpretation   Date/Time:  Sunday January 01 2015 13:42:09 EST Ventricular Rate:  94 PR Interval:  160 QRS Duration: 185 QT Interval:  456 QTC Calculation: 570 R Axis:   -33 Text Interpretation:  Sinus rhythm Left atrial enlargement Left bundle  branch block Left bundle branch block No significant change since last  tracing Confirmed by Kandis Mannan ([id number]) on 01/01/2015 1:55:10 PM      MDM   Final diagnoses:  Acute on chronic congestive heart failure, unspecified congestive heart failure type (HCC)    Medically noncompliant patient with shortness of breath and chest pain. Shortness breath started on Friday and has progressively worsened. Today the patient states that she has had persistent central chest pain that radiates to her left shoulder. Troponin is negative, potassium is low at 3.0. Will supplement this in the ED. BNP is 1241, patient does have some swelling on her hands and legs. Chest x-ray consistent with pulmonary vascular congestion. Blood pressure has been  elevated, 187/131, patient is mildly tachypneic 26, her O2 saturation is holding steady greater than 95%. She is afebrile. Will give the patient IV Lasix as well as potassium oral and IV. Patient will require admission for CHF exacerbation and chest pain. Plan with the patient, who understands and agrees to plan.  Patient discussed with Dr. Donnald Garre, who agrees with plan for admission.  EKG reviewed with Dr. Donnald Garre, deep t-waves, but seen previously also along with LBBB.  Supplementing K.  Prior notes reviewed.  EF 35-40% in 2014.  3:42 PM Appreciate Dr. Jomarie Longs for admitting the patient.      Roxy Horseman, PA-C 01/01/15 1546  Arby Barrette, MD 01/14/15 [phone]

## 2015-01-01 NOTE — ED Notes (Signed)
Pt can go to floor at 16:20, diane

## 2015-01-01 NOTE — H&P (Signed)
[facility] History and Physical  Laurie Wade UXL:[id number] DOB: [birth year] DOA: 01/01/2015  Referring physician:ED PA PCP: No primary care provider on file.   Chief Complaint: chest pain and shortness of breath  HPI: Laurie Wade is a 34 y.o. female with PMH of NICM, EF 40%, uncontrolled HTN, Non compliance, iron deficiency anemia presents to the ER with the above complaints. Pt reports that she's not taking any of her regular heart failure medications for about 2 months, has run out of them and has not seen her cardiologist in a long time, approximately 2years.. On Friday started having dyspnea with exertion which progressively worsened. Today also started having chest pain. This is substernal, radiates to shoulder and back. No fevers or chills. She also reports some orthopnea and swelling in her legs. In ER noted to have accelerated HTN, BNP>1200, negative troponin and CXR with pulm vascular congestion. [facility] was consulted   Review of Systems: positives bolded Constitutional:  No weight loss, night sweats, Fevers, chills, fatigue.  HEENT:  No headaches, Difficulty swallowing,Tooth/dental problems,Sore throat,  No sneezing, itching, ear ache, nasal congestion, post nasal drip,  Cardio-vascular:  No chest pain, Orthopnea, PND, swelling in lower extremities, anasarca, dizziness, palpitations  GI:  No heartburn, indigestion, abdominal pain, nausea, vomiting, diarrhea, change in bowel habits, loss of appetite  Resp:   shortness of breath with exertion or at rest. No excess mucus, no productive cough. non-productive cough, No coughing up of blood.No change in color of mucus.No wheezing.No chest wall deformity  Skin:  no rash or lesions.  GU:  no dysuria, change in color of urine, no urgency or frequency. No flank pain.  Musculoskeletal:  No joint pain or swelling. No decreased range of motion. No back pain.  Psych:  No change in mood or affect. No depression or anxiety. No  memory loss.   Past Medical History  Diagnosis Date  . Hypertension   . Chronic systolic heart failure ([place]) 05/2011    a. NICM b. 5/13 LHC: nl cors c. 7/14 ECHO: EF 35-40% diff HK, MV calcified annulus, LA mild dil, MV triv regur, TV triv regur  . Anemia   . Left bundle branch block   . Cardiomyopathy ([place])   . Iron deficiency anemia   . Headache   . Non-compliance   . Acute on chronic systolic heart failure ([place])   . Systolic congestive heart failure (HCC)     due to HTN  . 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)   . Chronic back pain   . Sciatica   . Chest wall pain   . Headache    Past Surgical History  Procedure Laterality Date  . Cesarean section    . Cholecystectomy    . Tubal ligation    . Left and right heart catheterization with coronary angiogram N/A 06/10/2011    Procedure: LEFT AND RIGHT HEART CATHETERIZATION WITH CORONARY ANGIOGRAM;  Surgeon: Jolaine Artist, MD;  Location: [facility] CATH LAB;  Service: Cardiovascular;  Laterality: N/A;  . Cardiac catheterization  2013    normal coronary arteries   Social History:  reports that she has quit smoking. She quit smokeless tobacco use about 8 years ago. She reports that she does not drink alcohol or use illicit drugs.  Allergies  Allergen Reactions  . Doxycycline Shortness Of Breath    Family History  Problem Relation Age of Onset  . Heart failure Mother   . Hypertension Mother     Prior  to Admission medications   Medication Sig Start Date End Date Taking? Authorizing Provider  acetaminophen (TYLENOL) 500 MG tablet Take 1,000 mg by mouth every 6 (six) hours as needed for moderate pain or headache.   Yes Historical Provider, MD  diphenhydrAMINE (BENADRYL) 25 mg capsule Take 25-50 mg by mouth every 6 (six) hours as needed for allergies (allergies).    Yes Historical Provider, MD  ibuprofen (ADVIL,MOTRIN) 200 MG tablet Take 400-600 mg by mouth every 6 (six) hours as needed for headache or  moderate pain.   Yes Historical Provider, MD  carvedilol (COREG) 12.5 MG tablet Take 1 tablet (12.5 mg total) by mouth daily. NO MORE REFILLS UNTIL YOU COMPLETE AN APPOINTMENT WITH CHF CLINIC. 08/24/14   Larey Dresser, MD  furosemide (LASIX) 40 MG tablet Take 1 tablet (40 mg total) by mouth daily. NO MORE REFILLS UNTIL YOU COMPLETE AN APPOINTMENT WITH CHF CLINIC. 08/24/14   Larey Dresser, MD  lisinopril (PRINIVIL,ZESTRIL) 20 MG tablet Take 1 tablet (20 mg total) by mouth daily. NO MORE REFILLS UNTIL YOU COMPLETE AN APPOINTMENT WITH CHF CLINIC. 08/24/14   Larey Dresser, MD  predniSONE (DELTASONE) 50 MG tablet Take 1 tablet (50 mg total) by mouth daily with breakfast. Patient not taking: Reported on 01/01/2015 07/12/14   Dalia Heading, PA-C  spironolactone (ALDACTONE) 25 MG tablet Take 1 tablet (25 mg total) by mouth daily. NO MORE REFILLS UNTIL YOU COMPLETE AN APPOINTMENT WITH CHF CLINIC. 08/24/14   Larey Dresser, MD  traMADol (ULTRAM) 50 MG tablet Take 1 tablet (50 mg total) by mouth every 6 (six) hours as needed. Patient not taking: Reported on 01/01/2015 07/12/14   Dalia Heading, PA-C   Physical Exam: Filed Vitals:   01/01/15 1341 01/01/15 1416 01/01/15 1539  BP: 197/128 187/131 182/132  Pulse: 96 90 88  Temp: 97.7 F (36.5 C)    TempSrc: Oral    Resp: _0 SpO2: 98% 97% 99%    Wt Readings from Last 3 Encounters:  07/12/14 90.719 kg (200 lb)  04/21/14 90.719 kg (200 lb)  03/04/14 90.719 kg (200 lb)    General:  Appears calm and comfortable Eyes: PERRL, normal lids, irises & conjunctiva ENT: grossly normal hearing, lips & tongue Neck: no LAD, masses or thyromegaly Cardiovascular: RRR, no m/r/g. No LE edema. Telemetry: SR, no arrhythmias  Respiratory: CTA bilaterally, no w/r/r. Normal respiratory effort. Abdomen: soft, ntnd Skin: no rash or induration seen on limited exam Musculoskeletal: grossly normal tone BUE/BLE Psychiatric: grossly normal mood and affect,  speech fluent and appropriate Neurologic: grossly non-focal.          Labs on Admission:  Basic Metabolic Panel:  Recent Labs Lab 01/01/15 1355  NA 144  K 3.0*  CL 106  CO2 28  GLUCOSE 100*  BUN 7  CREATININE 0.74  CALCIUM 9.5   Liver Function Tests: No results for input(s): AST, ALT, ALKPHOS, BILITOT, PROT, ALBUMIN in the last 168 hours. No results for input(s): LIPASE, AMYLASE in the last 168 hours. No results for input(s): AMMONIA in the last 168 hours. CBC:  Recent Labs Lab 01/01/15 1355  WBC 6.6  HGB 11.0*  HCT 33.6*  MCV 84.8  PLT 214   Cardiac Enzymes: No results for input(s): CKTOTAL, CKMB, CKMBINDEX, TROPONINI in the last 168 hours.  BNP (last 3 results)  Recent Labs  05/16/14 0509 01/01/15 1355  BNP 247.1* 1241.7*    ProBNP (last 3 results) No results for input(s): PROBNP  in the last 8760 hours.  CBG: No results for input(s): GLUCAP in the last 168 hours.  Radiological Exams on Admission: Dg Chest 2 View  01/01/2015  CLINICAL DATA:  Cough, congestion and shortness of breath for 1 week. New onset of chest pain today. EXAM: CHEST  2 VIEW COMPARISON:  05/16/2014 FINDINGS: Cardiomediastinal silhouette is enlarged. Mediastinal contours appear intact. There is no evidence of focal airspace consolidation, pleural effusion or pneumothorax. There is crowding of the vascular markings, suggestive of pulmonary vascular congestion. Osseous structures are without acute abnormality. Soft tissues are grossly normal. IMPRESSION: Enlarged cardiac silhouette. Pulmonary vascular congestion. Electronically Signed   By: Fidela Salisbury M.D.   On: 01/01/2015 14:03    EKG: Independently reviewed.LBBB unchanged from prior, QTC prolonged at 530  Assessment/Plan Principal Problem:   Acute on chronic systolic heart failure -due to long history of noncompliance -admit to tele -IV lasix 58m Q8, KCL,  -repeat ECHO -LHC in 2013 with normal coronaries -resume Coreg  and Lisinopril   Chest pain -due to #1, Normal coronaries on LHC in 2013 -also has muscles/shoulder aches and pains-? 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

## 2015-01-02 LAB — CBC
HEMATOCRIT: 31.9 % — AB (ref 36.0–46.0)
Hemoglobin: 10.2 g/dL — ABNORMAL LOW (ref 12.0–15.0)
MCH: 27.3 pg (ref 26.0–34.0)
MCHC: 32 g/dL (ref 30.0–36.0)
MCV: 85.5 fL (ref 78.0–100.0)
PLATELETS: 215 10*3/uL (ref 150–400)
RBC: 3.73 MIL/uL — ABNORMAL LOW (ref 3.87–5.11)
RDW: 15.9 % — AB (ref 11.5–15.5)
WBC: 7.2 10*3/uL (ref 4.0–10.5)

## 2015-01-02 LAB — BASIC METABOLIC PANEL
Anion gap: 11 (ref 5–15)
BUN: 16 mg/dL (ref 6–20)
CHLORIDE: 102 mmol/L (ref 101–111)
CO2: 28 mmol/L (ref 22–32)
CREATININE: 1.3 mg/dL — AB (ref 0.44–1.00)
Calcium: 9.2 mg/dL (ref 8.9–10.3)
GFR calc Af Amer: >60 mL/min (ref >60)
GFR calc non Af Amer: 52 mL/min — ABNORMAL LOW (ref >60)
GLUCOSE: 104 mg/dL — AB (ref 65–99)
Potassium: 3.3 mmol/L — ABNORMAL LOW (ref 3.5–5.1)
SODIUM: 141 mmol/L (ref 135–145)

## 2015-01-02 LAB — TSH: TSH: 0.948 u[IU]/mL (ref 0.350–4.500)

## 2015-01-02 MED ORDER — POTASSIUM CHLORIDE CRYS ER 20 MEQ PO TBCR
40.0000 meq | EXTENDED_RELEASE_TABLET | Freq: Once | ORAL | Status: AC
  Administered 2015-01-02: 40 meq via ORAL
  Filled 2015-01-02: qty 2

## 2015-01-02 MED ORDER — CARVEDILOL 6.25 MG PO TABS
6.2500 mg | ORAL_TABLET | Freq: Every day | ORAL | Status: DC
  Administered 2015-01-03 – 2015-01-04 (×2): 6.25 mg via ORAL
  Filled 2015-01-02 (×2): qty 1

## 2015-01-02 MED ORDER — FUROSEMIDE 20 MG PO TABS
20.0000 mg | ORAL_TABLET | Freq: Two times a day (BID) | ORAL | Status: DC
  Administered 2015-01-02: 20 mg via ORAL
  Filled 2015-01-02: qty 1

## 2015-01-02 MED ORDER — OXYCODONE HCL 5 MG PO TABS
5.0000 mg | ORAL_TABLET | ORAL | Status: DC | PRN
  Administered 2015-01-02 – 2015-01-04 (×10): 5 mg via ORAL
  Filled 2015-01-02 (×10): qty 1

## 2015-01-02 MED ORDER — FUROSEMIDE 10 MG/ML IJ SOLN: 20.0000 mg | Freq: Two times a day (BID) | INTRAMUSCULAR | Status: DC

## 2015-01-02 NOTE — Progress Notes (Signed)
[facility] PROGRESS NOTE  Laurie Wade UJW:[id number] DOB: 09/21/1980 DOA: 01/01/2015 PCP: No primary care provider on file.  Assessment/Plan: Acute on chronic systolic heart failure -due to long history of noncompliance -improving, lasix 40mg  IV this am and then change to PO -replace K -FU ECHO -LHC in 2013 with normal coronaries -continue Coreg, hold Lisinopril   AKI -hold ACE, cut down lasix, Bmet in am  Chest pain -due to #1, Normal coronaries on LHC in 2013 -also has muscles/shoulder aches and pains-? 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  -much improved, continue Coreg, Hold ACE, Hydralazine PRN -improving with diuresis   Non-compliance -advised compliance again   Iron deficiency anemia -stable  DVt proph: lovenox  Code Status: Full Code Family Communication: none at bedside Disposition Plan: Home tomorrow    HPI/Subjective: Feels better, breathing improved  Objective: Filed Vitals:   01/01/15 2142 01/02/15 0510  BP: 106/59 111/71  Pulse: 72 56  Temp: 97.9 F (36.6 C) 98 F (36.7 C)  Resp: 18 18    Intake/Output Summary (Last 24 hours) at 01/02/15 0824 Last data filed at 01/02/15 0700  Gross per 24 hour  Intake    600 ml  Output   1650 ml  Net  -1050 ml   Filed Weights   01/01/15 1640 01/02/15 0510  Weight: 92.4 kg (203 lb 11.3 oz) 93.3 kg (205 lb 11 oz)    Exam:   General:  AAOx3  Cardiovascular: S1S2/RRR  Respiratory: CTAB  Abdomen: soft, NT, BS present  Musculoskeletal: no edema c/c  Data Reviewed: Basic Metabolic Panel:  Recent Labs Lab 01/01/15 1355 01/02/15 0443  NA 144 141  K 3.0* 3.3*  CL 106 102  CO2 28 28  GLUCOSE 100* 104*  BUN 7 16  CREATININE 0.74 1.30*  CALCIUM 9.5 9.2   Liver Function Tests: No results for input(s): AST, ALT, ALKPHOS, BILITOT, PROT, ALBUMIN in the last 168 hours. No  results for input(s): LIPASE, AMYLASE in the last 168 hours. No results for input(s): AMMONIA in the last 168 hours. CBC:  Recent Labs Lab 01/01/15 1355 01/02/15 0443  WBC 6.6 7.2  HGB 11.0* 10.2*  HCT 33.6* 31.9*  MCV 84.8 85.5  PLT 214 215   Cardiac Enzymes:  Recent Labs Lab 01/01/15 1825  TROPONINI 0.03   BNP (last 3 results)  Recent Labs  05/16/14 0509 01/01/15 1355  BNP 247.1* 1241.7*    ProBNP (last 3 results) No results for input(s): PROBNP in the last 8760 hours.  CBG: No results for input(s): GLUCAP in the last 168 hours.  No results found for this or any previous visit (from the past 240 hour(s)).   Studies: Dg Chest 2 View  01/01/2015  CLINICAL DATA:  Cough, congestion and shortness of breath for 1 week. New onset of chest pain today. EXAM: CHEST  2 VIEW COMPARISON:  05/16/2014 FINDINGS: Cardiomediastinal silhouette is enlarged. Mediastinal contours appear intact. There is no evidence of focal airspace consolidation, pleural effusion or pneumothorax. There is crowding of the vascular markings, suggestive of pulmonary vascular congestion. Osseous structures are without acute abnormality. Soft tissues are grossly normal. IMPRESSION: Enlarged cardiac silhouette. Pulmonary vascular congestion. Electronically Signed   By: Ted Mcalpineobrinka  Dimitrova M.D.   On: 01/01/2015 14:03    Scheduled Meds: . carvedilol  12.5 mg Oral Daily  . enoxaparin (LOVENOX) injection  40 mg Subcutaneous Q24H  . furosemide  20  mg Intravenous BID  . potassium chloride  10 mEq Intravenous Once  . potassium chloride  40 mEq Oral BID  . potassium chloride  40 mEq Oral Once  . sodium chloride  3 mL Intravenous Q12H   Continuous Infusions:  Antibiotics Given (last 72 hours)    None      Principal Problem:   Acute on chronic systolic heart failure (HCC) Active Problems:   LBBB (left bundle branch block)   Iron deficiency anemia   Malignant essential hypertension with congestive heart  failure (HCC)   Non-compliance   CHF exacerbation (HCC)   CHF (congestive heart failure), NYHA class I (HCC)    Time spent:    [facility]  [facility] Pager [phone]. If 7PM-7AM, please contact night-coverage at [email or web address], password [facility] 01/02/2015, 8:24 AM  LOS: 1 day

## 2015-01-02 NOTE — Care Management Note (Signed)
Case Management Note  Patient Details  Name: Fayne Norriemanda A Tory MRN: [id number] Date of Birth: 03/10/1980  Subjective/Objective:  34 y/o f admitted w/CHF. No insurance-will explore resources. From home.                  Action/Plan:d/c plan home.   Expected Discharge Date:                  Expected Discharge Plan:  Home/Self Care  In-House Referral:     Discharge planning Services  CM Consult, Indigent Health Clinic, Medication Assistance  Post Acute Care Choice:    Choice offered to:     DME Arranged:    DME Agency:     HH Arranged:    HH Agency:     Status of Service:  In process, will continue to follow  Medicare Important Message Given:    Date Medicare IM Given:    Medicare IM give by:    Date Additional Medicare IM Given:    Additional Medicare Important Message give by:     If discussed at Long Length of Stay Meetings, dates discussed:    Additional Comments:  Lanier ClamMahabir, Tamar Lipscomb, RN 01/02/2015, 3:58 PM

## 2015-01-03 ENCOUNTER — Inpatient Hospital Stay (HOSPITAL_COMMUNITY): Payer: Medicaid Other

## 2015-01-03 ENCOUNTER — Telehealth: Payer: Self-pay

## 2015-01-03 DIAGNOSIS — R0600 Dyspnea, unspecified: Secondary | ICD-10-CM

## 2015-01-03 DIAGNOSIS — I509 Heart failure, unspecified: Secondary | ICD-10-CM | POA: Insufficient documentation

## 2015-01-03 LAB — BASIC METABOLIC PANEL
Anion gap: 7 (ref 5–15)
BUN: 21 mg/dL — AB (ref 6–20)
CALCIUM: 8.7 mg/dL — AB (ref 8.9–10.3)
CO2: 24 mmol/L (ref 22–32)
CREATININE: 1.2 mg/dL — AB (ref 0.44–1.00)
Chloride: 107 mmol/L (ref 101–111)
GFR calc Af Amer: >60 mL/min (ref >60)
GFR calc non Af Amer: 57 mL/min — ABNORMAL LOW (ref >60)
GLUCOSE: 94 mg/dL (ref 65–99)
Potassium: 4.5 mmol/L (ref 3.5–5.1)
Sodium: 138 mmol/L (ref 135–145)

## 2015-01-03 LAB — BRAIN NATRIURETIC PEPTIDE: B Natriuretic Peptide: 66.9 pg/mL (ref 0.0–100.0)

## 2015-01-03 MED ORDER — GUAIFENESIN-DM 100-10 MG/5ML PO SYRP
5.0000 mL | ORAL_SOLUTION | ORAL | Status: DC | PRN
  Administered 2015-01-03 (×3): 5 mL via ORAL
  Filled 2015-01-03 (×3): qty 10

## 2015-01-03 MED ORDER — DIPHENHYDRAMINE HCL 25 MG PO CAPS: 25.0000 mg | ORAL_CAPSULE | Freq: Four times a day (QID) | ORAL | Status: AC | PRN

## 2015-01-03 MED ORDER — CARVEDILOL 6.25 MG PO TABS: 6.2500 mg | ORAL_TABLET | Freq: Two times a day (BID) | ORAL | Status: DC

## 2015-01-03 MED ORDER — FUROSEMIDE 20 MG PO TABS: 20.0000 mg | ORAL_TABLET | Freq: Every day | ORAL | Status: DC

## 2015-01-03 MED ORDER — POTASSIUM CHLORIDE CRYS ER 20 MEQ PO TBCR: 40.0000 meq | EXTENDED_RELEASE_TABLET | Freq: Every day | ORAL | Status: DC

## 2015-01-03 NOTE — Telephone Encounter (Signed)
Request received from Lanier ClamKathy Mahabir, RN CM inquiring if the patient is eligible for the Transitional Care Clinic (TCC)  at [facility].  After review of her record it was determined that she is not eligible for TCC but a hospital follow up appointment was scheduled for 01/11/14 @ 1000 with Dr Hyman HopesJegede.  The information was placed on the AVS and an update was provided to K. Mahabir, RN CM

## 2015-01-03 NOTE — Progress Notes (Signed)
  Echocardiogram 2D Echocardiogram has been performed.  Laurie SavoyCasey Wade Laurie Wade 01/03/2015, 1:47 PM

## 2015-01-03 NOTE — Care Management Note (Signed)
Case Management Note  Patient Details  Name: Laurie Wade MRN: [id number] Date of Birth: 11/07/1980  Subjective/Objective: 34 y/o f admitted w/Acute on chronic CHF. Currently not eligible for HF clinic-Noted EF 40%.No pcp, no health insurance.Provided w/pcp listing-encouraged [facility]-appt set,they will have cardiologist follow her. Provided w/other community resources.  Patient informed that she can get meds @ [facility].Patient voiced understanding.                 Action/Plan:d/c home.   Expected Discharge Date:                  Expected Discharge Plan:  Home/Self Care  In-House Referral:     Discharge planning Services  CM Consult, Indigent Health Clinic, Medication Assistance  Post Acute Care Choice:    Choice offered to:     DME Arranged:    DME Agency:     HH Arranged:    HH Agency:     Status of Service:  Completed, signed off  Medicare Important Message Given:    Date Medicare IM Given:    Medicare IM give by:    Date Additional Medicare IM Given:    Additional Medicare Important Message give by:     If discussed at Long Length of Stay Meetings, dates discussed:    Additional Comments:  Lanier ClamMahabir, Leanna Hamid, RN 01/03/2015, 1:38 PM

## 2015-01-04 ENCOUNTER — Telehealth: Payer: Self-pay

## 2015-01-04 DIAGNOSIS — I509 Heart failure, unspecified: Secondary | ICD-10-CM

## 2015-01-04 LAB — BASIC METABOLIC PANEL
ANION GAP: 7 (ref 5–15)
BUN: 15 mg/dL (ref 6–20)
CALCIUM: 9.4 mg/dL (ref 8.9–10.3)
CO2: 27 mmol/L (ref 22–32)
Chloride: 104 mmol/L (ref 101–111)
Creatinine, Ser: 0.88 mg/dL (ref 0.44–1.00)
Glucose, Bld: 100 mg/dL — ABNORMAL HIGH (ref 65–99)
POTASSIUM: 4.7 mmol/L (ref 3.5–5.1)
Sodium: 138 mmol/L (ref 135–145)

## 2015-01-04 MED ORDER — TRAMADOL HCL 50 MG PO TABS: 50.0000 mg | ORAL_TABLET | Freq: Two times a day (BID) | ORAL | Status: AC | PRN

## 2015-01-04 MED ORDER — POTASSIUM CHLORIDE CRYS ER 20 MEQ PO TBCR: 40.0000 meq | EXTENDED_RELEASE_TABLET | Freq: Every day | ORAL | Status: AC

## 2015-01-04 MED ORDER — CARVEDILOL 6.25 MG PO TABS: 6.2500 mg | ORAL_TABLET | Freq: Two times a day (BID) | ORAL | Status: AC

## 2015-01-04 MED ORDER — LISINOPRIL 5 MG PO TABS
5.0000 mg | ORAL_TABLET | Freq: Every day | ORAL | Status: DC
  Filled 2015-01-04: qty 1

## 2015-01-04 MED ORDER — UNABLE TO FIND: Status: AC

## 2015-01-04 MED ORDER — LISINOPRIL 5 MG PO TABS: 5.0000 mg | ORAL_TABLET | Freq: Every day | ORAL | Status: AC

## 2015-01-04 MED ORDER — FUROSEMIDE 20 MG PO TABS: 20.0000 mg | ORAL_TABLET | Freq: Every day | ORAL | Status: AC

## 2015-01-04 NOTE — Telephone Encounter (Signed)
Due to a provider change in schedule,  the patient's [facility] the [facility] has been changed to 01/13/15@ 1400.  Attempted to contact the patient to notify her of the schedule change.  Call placed to # [phone] and the message noted that the person is not accepting calls at this time.  Call placed to # [phone] and was informed that it is a wrong number. Call then placed to patient's contact  - Alanda Amassnnie Leath # [phone] and she noted that the patient is not there. She said that she is the children's grandmother. She agreed to give the patient a message to call the [facility] at # [phone] and ask for Erskine SquibbJane or Shanda BumpsJessica.

## 2015-01-04 NOTE — Care Management Note (Signed)
Case Management Note  Patient Details  Name: Fayne Norriemanda A Eddie MRN: [id number] Date of Birth: 04/26/1980  Subjective/Objective:   [facility] appt set.                 Action/Plan:d/c home no further d/c needs or orders.   Expected Discharge Date:                  Expected Discharge Plan:  Home/Self Care  In-House Referral:     Discharge planning Services  CM Consult, Indigent Health Clinic, Medication Assistance  Post Acute Care Choice:    Choice offered to:     DME Arranged:    DME Agency:     HH Arranged:    HH Agency:     Status of Service:  Completed, signed off  Medicare Important Message Given:    Date Medicare IM Given:    Medicare IM give by:    Date Additional Medicare IM Given:    Additional Medicare Important Message give by:     If discussed at Long Length of Stay Meetings, dates discussed:    Additional Comments:  Lanier ClamMahabir, Melek Pownall, RN 01/04/2015, 11:53 AM

## 2015-01-10 ENCOUNTER — Telehealth: Payer: Self-pay

## 2015-01-10 NOTE — Telephone Encounter (Signed)
Attempted to contact the patient to inform her that due to a change in the provider schedule, her appointment has been rescheduled to 01/13/15@ 1400. Call placed to # [phone] and a voice mail message was left requesting a call back to # [phone] or [phone].

## 2015-01-10 NOTE — Discharge Summary (Signed)
Physician Discharge Summary  BETHANIA SCHLOTZHAUER ZOX:[id number] DOB: [birth year] DOA: 01/01/2015  PCP: No primary care provider on file.  Admit date: 01/01/2015 Discharge date: 01/04/2016  Time spent: 45 minutes  Recommendations for Outpatient Follow-up:  1. [facility] 01/11/14 at 10am 2. Please check Bmet at FU 3. 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 female with PMH of NICM, EF 40%, uncontrolled HTN, Non compliance, iron deficiency anemia presented to the ER with the above complaints. Pt reports that she was not taking any of her regular heart failure medications for about 2 months, has run out of them and has not seen her cardiologist in a long time, approximately 2years.. On Friday started having dyspnea with exertion which progressively worsened. 12/25 also started having chest pain. This was substernal, radiated to shoulder and back.She also reported some orthopnea and swelling in her legs. In ER noted to have accelerated HTN, BNP>1200, negative troponin and CXR with pulm vascular congestion  Hospital Course:  Acute on chronic systolic heart failure -due to long history of noncompliance, had not been taking meds for over 2 months and not seen  MD in atleast 2 years. -improving, diuresed with IV lasix and changed to PO lasix at discharge -replaced K -2D ECHO with severe LVH and EF of 30-35% without much change from prior -LHC in 2013 with normal coronaries -continue Coreg, resumed Lisinopril at low dose  AKI -had mild AKI on admission, then creatinine improved with improvement in volume status and low dose ACE was added  Chest pain -due to #1, Normal coronaries on LHC in 2013 -also has muscles/shoulder aches and pains-? 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  -much improved, continue Coreg, resumed ACE, Hydralazine PRN used inpatient -improved with diuresis   Non-compliance -advised compliance again   Iron deficiency anemia -stable  Discharge Exam: Filed Vitals:   01/03/15 2115 01/04/15 0543  BP: 141/90 148/88  Pulse: 73 71  Temp: 98.2 F (36.8 C) 98.5 F (36.9 C)  Resp: 18 18    General: AAOx3 Cardiovascular: S1S2/RRR Respiratory: CTAB  Discharge Instructions   Discharge Instructions    Amb Referral to HF Clinic    Complete by:  As directed      Diet - low sodium heart healthy    Complete by:  As directed      Increase activity slowly    Complete by:  As directed           Discharge Medication List as of 01/04/2015  8:47 AM    START taking these medications   Details  UNABLE TO FIND This note is to  excuse Ms.Giovanetti from work due to medical illness requiring hospitalization from 12/24 through 12/28, ok to return to work 12/29, Print      CONTINUE these medications which have CHANGED   Details  carvedilol (COREG) 6.25 MG tablet Take 1 tablet (6.25 mg total) by mouth 2 (two) times daily with a meal., Starting 01/04/2015, Until Discontinued, Print    diphenhydrAMINE (BENADRYL) 25 mg capsule Take 1 capsule (25 mg total) by mouth every 6 (six) hours as needed for allergies (allergies).,  Starting 01/03/2015, Until Discontinued, Normal    furosemide (LASIX) 20 MG tablet Take 1 tablet (20 mg total) by mouth daily., Starting 01/04/2015, Until Discontinued, Print    lisinopril (PRINIVIL,ZESTRIL) 5 MG tablet Take 1 tablet (5 mg total) by mouth daily., Starting 01/04/2015, Until Discontinued, Print    potassium chloride SA (K-DUR,KLOR-CON) 20 MEQ tablet Take 2 tablets (40 mEq total) by mouth daily., Starting 01/04/2015, Until Discontinued, Print    traMADol (ULTRAM) 50 MG tablet Take 1 tablet (50 mg total) by mouth every 12 (twelve) hours as needed for moderate pain or severe pain., Starting 01/04/2015, Until Discontinued, Print      CONTINUE these medications which have NOT CHANGED   Details  acetaminophen (TYLENOL) 500 MG tablet Take 1,000 mg by mouth every 6 (six) hours as needed for moderate pain or headache., Until Discontinued, Historical Med      STOP taking these medications     ibuprofen (ADVIL,MOTRIN) 200 MG tablet      predniSONE (DELTASONE) 50 MG tablet      spironolactone (ALDACTONE) 25 MG tablet        Allergies  Allergen Reactions  . Doxycycline Shortness Of Breath   Follow-up Information    Follow up with [facility]. Go on 01/12/2015.   Specialty:  Internal Medicine   Why:  at 10:00am for a hospital follow up appointment with Dr Stacey Drain information:   [id number] [place] [address] [phone]       The results of significant diagnostics from this hospitalization (including imaging, microbiology, ancillary and laboratory) are listed below for reference.    Significant Diagnostic Studies: Dg Chest 2 View  01/01/2015  CLINICAL DATA:  Cough, congestion and shortness of breath for 1 week. New onset of chest pain today. EXAM: CHEST  2 VIEW COMPARISON:  05/16/2014 FINDINGS: Cardiomediastinal silhouette is enlarged. Mediastinal contours appear intact. There is no evidence of  focal airspace consolidation, pleural effusion or pneumothorax. There is crowding of the vascular markings, suggestive of pulmonary vascular congestion. Osseous structures are without acute abnormality. Soft tissues are grossly normal. IMPRESSION: Enlarged cardiac silhouette. Pulmonary vascular congestion. Electronically Signed   By: Ted Mcalpine M.D.   On: 01/01/2015 14:03    Microbiology: No results found for this or any previous visit (from the past 240 hour(s)).   Labs: Basic Metabolic Panel:  Recent Labs Lab 01/04/15 0530  NA 138  K 4.7  CL 104  CO2 27  GLUCOSE 100*  BUN 15  CREATININE 0.88  CALCIUM 9.4   Liver Function Tests: No results for input(s): AST, ALT, ALKPHOS, BILITOT, PROT, ALBUMIN in the last 168 hours. No results for input(s): LIPASE, AMYLASE in the last 168 hours. No results for input(s): AMMONIA in the last 168 hours. CBC: No results for input(s): WBC, NEUTROABS, HGB, HCT, MCV, PLT in the last 168 hours. Cardiac Enzymes: No results for input(s): CKTOTAL, CKMB, CKMBINDEX, TROPONINI in the last 168  hours. BNP: BNP (last 3 results)  Recent Labs  05/16/14 0509 01/01/15 1355 01/03/15 0906  BNP 247.1* 1241.7* 66.9    ProBNP (last 3 results) No results for input(s): PROBNP in the last 8760 hours.  CBG: No results for input(s): GLUCAP in the last 168 hours.     SignedZannie Cove:  Markeis Allman MD    [facility] 01/10/2015, 3:50 PM

## 2015-01-12 ENCOUNTER — Inpatient Hospital Stay: Payer: Medicaid Other | Admitting: Internal Medicine

## 2015-01-13 ENCOUNTER — Inpatient Hospital Stay: Payer: Medicaid Other | Admitting: Family Medicine

## 2015-02-08 ENCOUNTER — Inpatient Hospital Stay (HOSPITAL_COMMUNITY): Admission: RE | Admit: 2015-02-08 | Payer: Medicaid Other | Source: Ambulatory Visit

## 2015-02-28 ENCOUNTER — Emergency Department (HOSPITAL_COMMUNITY)
Admission: EM | Admit: 2015-02-28 | Discharge: 2015-03-01 | Disposition: A | Discharge status: Home / Self Care | Payer: Medicaid Other | Attending: Emergency Medicine | Admitting: Emergency Medicine

## 2015-02-28 ENCOUNTER — Emergency Department (HOSPITAL_COMMUNITY): Payer: No Typology Code available for payment source

## 2015-02-28 DIAGNOSIS — I110 Hypertensive heart disease with heart failure: Secondary | ICD-10-CM | POA: Insufficient documentation

## 2015-02-28 DIAGNOSIS — F419 Anxiety disorder, unspecified: Secondary | ICD-10-CM | POA: Insufficient documentation

## 2015-02-28 DIAGNOSIS — R000 Tachycardia, unspecified: Secondary | ICD-10-CM | POA: Insufficient documentation

## 2015-02-28 DIAGNOSIS — Z79899 Other long term (current) drug therapy: Secondary | ICD-10-CM | POA: Insufficient documentation

## 2015-02-28 DIAGNOSIS — R079 Chest pain, unspecified: Secondary | ICD-10-CM | POA: Insufficient documentation

## 2015-02-28 DIAGNOSIS — Z9889 Other specified postprocedural states: Secondary | ICD-10-CM | POA: Insufficient documentation

## 2015-02-28 DIAGNOSIS — G8929 Other chronic pain: Secondary | ICD-10-CM | POA: Insufficient documentation

## 2015-02-28 DIAGNOSIS — Z87891 Personal history of nicotine dependence: Secondary | ICD-10-CM | POA: Insufficient documentation

## 2015-02-28 DIAGNOSIS — Z862 Personal history of diseases of the blood and blood-forming organs and certain disorders involving the immune mechanism: Secondary | ICD-10-CM | POA: Insufficient documentation

## 2015-02-28 DIAGNOSIS — R05 Cough: Secondary | ICD-10-CM | POA: Insufficient documentation

## 2015-02-28 DIAGNOSIS — Z9119 Patient's noncompliance with other medical treatment and regimen: Secondary | ICD-10-CM | POA: Insufficient documentation

## 2015-02-28 DIAGNOSIS — Z3202 Encounter for pregnancy test, result negative: Secondary | ICD-10-CM | POA: Insufficient documentation

## 2015-02-28 DIAGNOSIS — I5022 Chronic systolic (congestive) heart failure: Secondary | ICD-10-CM | POA: Insufficient documentation

## 2015-02-28 LAB — CBC
HEMATOCRIT: 32.4 % — AB (ref 36.0–46.0)
HEMOGLOBIN: 10.8 g/dL — AB (ref 12.0–15.0)
MCH: 28.5 pg (ref 26.0–34.0)
MCHC: 33.3 g/dL (ref 30.0–36.0)
MCV: 85.5 fL (ref 78.0–100.0)
Platelets: 203 10*3/uL (ref 150–400)
RBC: 3.79 MIL/uL — AB (ref 3.87–5.11)
RDW: 15.3 % (ref 11.5–15.5)
WBC: 6.7 10*3/uL (ref 4.0–10.5)

## 2015-02-28 LAB — BASIC METABOLIC PANEL
ANION GAP: 9 (ref 5–15)
BUN: 8 mg/dL (ref 6–20)
CO2: 19 mmol/L — ABNORMAL LOW (ref 22–32)
Calcium: 9.8 mg/dL (ref 8.9–10.3)
Chloride: 114 mmol/L — ABNORMAL HIGH (ref 101–111)
Creatinine, Ser: 0.75 mg/dL (ref 0.44–1.00)
GFR calc Af Amer: >60 mL/min (ref >60)
Glucose, Bld: 105 mg/dL — ABNORMAL HIGH (ref 65–99)
POTASSIUM: 3.3 mmol/L — AB (ref 3.5–5.1)
SODIUM: 142 mmol/L (ref 135–145)

## 2015-02-28 LAB — I-STAT TROPONIN, ED: Troponin i, poc: 0.04 ng/mL (ref 0.00–0.08)

## 2015-02-28 MED ORDER — IBUPROFEN 800 MG PO TABS
800.0000 mg | ORAL_TABLET | Freq: Once | ORAL | Status: AC
  Administered 2015-03-01: 800 mg via ORAL
  Filled 2015-02-28: qty 1

## 2015-02-28 MED ORDER — NITROGLYCERIN 0.4 MG SL SUBL
0.4000 mg | SUBLINGUAL_TABLET | SUBLINGUAL | Status: DC | PRN
Start: 2015-02-28 — End: 2015-03-01
  Administered 2015-03-01 (×2): 0.4 mg via SUBLINGUAL
  Filled 2015-02-28: qty 1

## 2015-02-28 MED ORDER — PREDNISONE 20 MG PO TABS
60.0000 mg | ORAL_TABLET | Freq: Once | ORAL | Status: AC
  Administered 2015-03-01: 60 mg via ORAL
  Filled 2015-02-28: qty 3

## 2015-02-28 MED ORDER — ONDANSETRON HCL 4 MG PO TABS
4.0000 mg | ORAL_TABLET | Freq: Once | ORAL | Status: AC
  Administered 2015-03-01: 4 mg via ORAL
  Filled 2015-02-28: qty 1

## 2015-02-28 NOTE — ED Provider Notes (Signed)
CSN: [id number]     Arrival date & time 02/28/15  1531 History   First MD Initiated Contact with Patient 02/28/15 2316     Chief Complaint  Patient presents with  . Cough  . Chest Pain   HPI Laurie Wade is a 35 y.o. female PMH significant for chronic systolic heart failure (last echo in July 2014 revealed EF of 35-40%), left bundle branch block, cardiomyopathy, hypertension presenting with a 4 day history of productive cough (no hemoptysis), chest pain, nausea. She describes her chest pain as 8 out of 10 pain scale, constant, worsened with lying down and coughing, achy, midsternal in location, nonradiating. She denies fevers, chills, abdominal pain, diarrhea, urinary symptoms.   Past Medical History  Diagnosis Date  . Hypertension   . Chronic systolic heart failure (HCC) 05/2011    a. NICM b. 5/13 LHC: nl cors c. 7/14 ECHO: EF 35-40% diff HK, MV calcified annulus, LA mild dil, MV triv regur, TV triv regur  . Anemia   . Left bundle branch block   . Cardiomyopathy (HCC)   . Iron deficiency anemia   . Headache   . Non-compliance   . Acute on chronic systolic heart failure (HCC)   . Systolic congestive heart failure (HCC)     due to HTN  . 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 Chronic back pain   . Sciatica   . Chest wall pain   . Headache    Past Surgical History  Procedure Laterality Date  . Cesarean section    . Cholecystectomy    . Tubal ligation    . Left and right heart catheterization with coronary angiogram N/A 06/10/2011    Procedure: LEFT AND RIGHT HEART CATHETERIZATION WITH CORONARY ANGIOGRAM;  Surgeon: Dolores Patty, MD;  Location[facility] LAB;  Service: Cardiovascular;  Laterality: N/A;  . Cardiac catheterization  2013    normal coronary arteries   Family History  Problem Relation Age of Onset  . Heart failure Mother   . Hypertension Mother    Social History  Substance Use Topics  . Smoking status: Former Games developer  .  Smokeless tobacco: Former Neurosurgeon    Quit date: 06/06/2006  . Alcohol Use: No   OB History    No data available     Review of Systems  Ten systems are reviewed and are negative for acute change except as noted in the HPI  Allergies  Doxycycline  Home Medications   Prior to Admission medications   Medication Sig Start Date End Date Taking? Authorizing Provider  carvedilol (COREG) 6.25 MG tablet Take 1 tablet (6.25 mg total) by mouth 2 (two) times daily with a meal. 01/04/15  Yes Zannie Cove, MD  diphenhydrAMINE (BENADRYL) 25 mg capsule Take 1 capsule (25 mg total) by mouth every 6 (six) hours as needed for allergies (allergies). 01/03/15  Yes Zannie Cove, MD  furosemide (LASIX) 20 MG tablet Take 1 tablet (20 mg total) by mouth daily. 01/04/15  Yes Zannie Cove, MD  ibuprofen (ADVIL,MOTRIN) 200 MG tablet Take 600-800 mg by mouth every 6 (six) hours as needed for headache or moderate pain.   Yes Historical Provider, MD  lisinopril (PRINIVIL,ZESTRIL) 5 MG tablet Take 1 tablet (5 mg total) by mouth daily. 01/04/15  Yes Zannie Cove, MD  potassium chloride SA (K-DUR,KLOR-CON) 20 MEQ tablet Take 2 tablets (40 mEq total) by mouth daily. 01/04/15  Yes Zannie Cove, MD  traMADol (ULTRAM) 50 MG tablet  Take 1 tablet (50 mg total) by mouth every 12 (twelve) hours as needed for moderate pain or severe pain. 01/04/15   Zannie Cove, MD  UNABLE TO FIND This note is to excuse Ms.Rottman from work due to medical illness requiring hospitalization from 12/24 through 12/28, ok to return to work 12/29 01/04/15   Zannie Cove, MD   BP 179/113 mmHg  Pulse 120  Temp(Src) 98.5 F (36.9 C) (Oral)  SpO2 100%  LMP 02/21/2015 (Within Days) Physical Exam  Constitutional: She appears well-developed and well-nourished. No distress.  HENT:  Head: Normocephalic and atraumatic.  Mouth/Throat: Oropharynx is clear and moist. No oropharyngeal exudate.  Eyes: Conjunctivae are normal. Pupils are equal,  round, and reactive to light. Right eye exhibits no discharge. Left eye exhibits no discharge. No scleral icterus.  Neck: No tracheal deviation present.  Cardiovascular: Regular rhythm, normal heart sounds and intact distal pulses.  Exam reveals no gallop and no friction rub.   No murmur heard. Tachycardia  Pulmonary/Chest: Effort normal and breath sounds normal. No respiratory distress. She has no wheezes. She has no rales. She exhibits no tenderness.  Abdominal: Soft. Bowel sounds are normal. She exhibits no distension and no mass. There is no tenderness. There is no rebound and no guarding.  Musculoskeletal: She exhibits no edema.  Lymphadenopathy:    She has no cervical adenopathy.  Neurological: She is alert. Coordination normal.  Skin: Skin is warm and dry. No rash noted. She is not diaphoretic. No erythema.  Psychiatric:  Anxious appearing  Nursing note and vitals reviewed.   ED Course  Procedures  Labs Review Labs Reviewed  BASIC METABOLIC PANEL - Abnormal; Notable for the following:    Potassium 3.3 (*)    Chloride 114 (*)    CO2 19 (*)    Glucose, Bld 105 (*)    All other components within normal limits  CBC - Abnormal; Notable for the following:    RBC 3.79 (*)    Hemoglobin 10.8 (*)    HCT 32.4 (*)    All other components within normal limits  I-STAT TROPOININ, ED   Imaging Review Dg Chest 2 View  02/28/2015  CLINICAL DATA:  Chest pain and congestion for 3 days. Left bundle branch block. Cardiomyopathy. Acute on chronic systolic congestive heart failure. EXAM: CHEST  2 VIEW COMPARISON:  01/01/2015 FINDINGS: Moderate cardiomegaly is stable. Both lungs are clear. No evidence of pulmonary infiltrate or edema. No evidence of pneumothorax or pleural effusion. IMPRESSION: Stable cardiomegaly.  No active lung disease. Electronically Signed   By: Myles Rosenthal M.D.   On: 02/28/2015 16:28   I have personally reviewed and evaluated these images and lab results as part of my  medical decision-making.   EKG Interpretation   Date/Time:  Tuesday February 28 2015 15:48:59 EST Ventricular Rate:  120 PR Interval:  217 QRS Duration: 183 QT Interval:  360 QTC Calculation: 509 R Axis:   156 Text Interpretation:  Sinus tachycardia Prolonged PR interval LAE,  consider biatrial enlargement Left bundle branch block Baseline wander No  significant change since last tracing 01 Jan 2015 Confirmed by KNAPP   MD-I, IVA ([id number]) on 03/01/2015 1:02:46 AM      MDM   Final diagnoses:  Chest pain, unspecified chest pain type   Patient tachycardic, otherwise, VSS. Based on patient history and physical exam, most likely etiologies include ACS vs anxiety vs URI vs MSK vs PE (although low risk, pt has tachycardia, cough and CP, so will  d-dimer). Less likely etiologies include Prinzmetal's/cocaine-induced angina, pericardial effusion, cardiac tamponade, constrictive pericarditis, aortic dissection, thoracic aortic aneurysm, CHF/acute pulmonary edema, pneumonia, pneumothorax, tension pneumothorax, Mallory-Weiss tear, Boerhaave syndrome, biliary disease, pancreatitis, herpes zoster. BMP, CBC, hcg, troponin x 2, chest x-ray, EKG without acute change. Discussed case with Dr. Lynelle Doctor- patient is followed by cardiology (seen in office yesterday for routine OP followup). She is noncompliant with her medications and has had multiple ER visits for similar symptoms, and negative workups.   Care handoff to Elpidio Anis, PA-C at shift change. Plan is as follows: D-dimer pending. If negative d-dimer, patient may be safely discharged home with close cardiology and PCP follow-up.    Melton Krebs, PA-C 03/01/15 2219  Devoria Albe, MD 03/03/15 [phone]

## 2015-02-28 NOTE — ED Notes (Signed)
Pt states that she has had a cough with chest pain and nausea. Productive cough. Alert and oriented. Hypertensive.

## 2015-02-28 NOTE — ED Notes (Signed)
Pt is actively drinking a ginger ale

## 2015-03-01 ENCOUNTER — Telehealth (HOSPITAL_COMMUNITY): Payer: Self-pay | Admitting: Vascular Surgery

## 2015-03-01 ENCOUNTER — Inpatient Hospital Stay (HOSPITAL_COMMUNITY): Admission: RE | Admit: 2015-03-01 | Payer: Medicaid Other | Source: Ambulatory Visit

## 2015-03-01 LAB — I-STAT BETA HCG BLOOD, ED (MC, WL, AP ONLY): I-stat hCG, quantitative: <5 m[IU]/mL (ref <5)

## 2015-03-01 LAB — I-STAT TROPONIN, ED: TROPONIN I, POC: 0.04 ng/mL (ref 0.00–0.08)

## 2015-03-01 LAB — D-DIMER, QUANTITATIVE: D-Dimer, Quant: 0.42 ug/mL-FEU (ref 0.00–0.50)

## 2015-03-01 MED ORDER — BENZONATATE 100 MG PO CAPS
200.0000 mg | ORAL_CAPSULE | Freq: Once | ORAL | Status: AC
  Administered 2015-03-01: 200 mg via ORAL
  Filled 2015-03-01: qty 2

## 2015-03-01 MED ORDER — BENZONATATE 100 MG PO CAPS: 100.0000 mg | ORAL_CAPSULE | Freq: Three times a day (TID) | ORAL | Status: AC

## 2015-03-01 MED ORDER — ONDANSETRON HCL 4 MG PO TABS: 4.0000 mg | ORAL_TABLET | Freq: Four times a day (QID) | ORAL | Status: AC | PRN

## 2015-03-01 NOTE — ED Notes (Signed)
Per lab blue top hemolyzed, phleb notified of need for redraw

## 2015-03-01 NOTE — Discharge Instructions (Signed)
Laurie Wade,  Nice meeting you! Please follow-up with your primary care provider and cardiologist. Return to the emergency department if you develop increasing chest pain, shortness of breath, are unable to keep foods down. Feel better soon!  S. Lane Hacker, PA-C   Emergency Department Resource Guide 1) Find a Doctor and Pay Out of Pocket Although you won't have to find out who is covered by your insurance plan, it is a good idea to ask around and get recommendations. You will then need to call the office and see if the doctor you have chosen will accept you as a new patient and what types of options they offer for patients who are self-pay. Some doctors offer discounts or will set up payment plans for their patients who do not have insurance, but you will need to ask so you aren't surprised when you get to your appointment.  2) Contact Your Local Health Department Not all health departments have doctors that can see patients for sick visits, but many do, so it is worth a call to see if yours does. If you don't know where your local health department is, you can check in your phone book. The CDC also has a tool to help you locate your state's health department, and many state websites also have listings of all of their local health departments.  3) Find a Walk-in Clinic If your illness is not likely to be very severe or complicated, you may want to try a walk in clinic. These are popping up all over the country in pharmacies, drugstores, and shopping centers. They're usually staffed by nurse practitioners or physician assistants that have been trained to treat common illnesses and complaints. They're usually fairly quick and inexpensive. However, if you have serious medical issues or chronic medical problems, these are probably not your best option.  No Primary Care Doctor: - Call Health Connect at  [phone] - they can help you locate a primary care doctor that  accepts your insurance,  provides certain services, etc. - Physician Referral Service- [phone]  Chronic Pain Problems: Organization         Address  Phone   Notes  [facility]  [phone] Patients need to be referred by their primary care doctor.   Medication Assistance: Organization         Address  Phone   Notes  [facility] Medication [facility] [address] [place] [place], [place] [address] [phone] --Must be a resident of [facility] -- Must have NO insurance coverage whatsoever (no Medicaid/ Medicare, etc.) -- The pt. MUST have a primary care doctor that directs their care regularly and follows them in the community   [facility]  [phone]   [employer]  [phone]    Agencies that provide inexpensive medical care: Organization         Address  Phone   Notes  [facility]  [phone]   [facility]    [phone]   [facility] [address] [place], [place] [phone]   [facility] [address] [place] [place] [phone]   Planned Parenthood    4252285889   [facility]    [phone]   [facility]  [address], [place] Phone:  [phone], Fax:  [phone] Hours of Operation:  9 am - 6 pm, M-F.  Also accepts Medicaid/Medicare and self-pay.  [facility] for [place], [address], [place] Phone: [phone], Fax: [phone]. Hours of Operation:  8:30 am - 5:30 pm, M-F.  Also accepts Medicaid and self-pay.  [facility] [address] [place] Phone: [phone]   [place], [place], [place] [phone], Ext. 123 Mondays & Thursdays: 7-9 AM.  First 15 patients are seen on a first come, first serve basis.    [place] Providers:  Organization         Address  Phone    Notes  [facility] [address], [place] [phone] Also accepts self-pay patients.  [facility] [address], [place]  [phone]   [place], [address], [place] [phone]   [facility] [address], [place] [phone]   Lucianne Lei [address], [place]   [phone] Only accepts [place] Access [place] patients after they have their name applied to their card.   Self-Pay (no insurance) in [facility]:  Organization         Address  Phone   Notes  Sickle Cell Patients, [facility] [place] [phone]   [facility] Urgent Care [place] [phone]   [facility] [place]  [place], [place], [place] [phone]   [facility]/Dr. Osei-Bonsu  [address] [place] or [address], [place] [phone] Phone number for both [place] and [place] locations is the same.  [facility] [address] [place] [phone]   [facility] [address], [place] or [address] [phone]   [facility] [address] [place] [phone], phone; [phone], fax Sees patients 1st and 3rd Saturday of every month.  Must not qualify for public or private insurance (i.e. Medicaid, Medicare, [place] Health Choice, Veterans' Benefits)  Household income should be no more than 200% of the poverty level The clinic cannot treat you if you are pregnant or think you are pregnant  Sexually transmitted diseases are not treated at the clinic.    Dental Care: Organization         Address  Phone  Notes  [facility] [place] [phone] Accepts children up to age 1 who are enrolled in [place] or [place]; pregnant women with a Medicaid card; and children who have applied for Medicaid or [place] Health Choice, but were declined, whose parents can pay a reduced fee at time of service.  [facility] Department of [facility]  [address], [place] [phone] Accepts children up to age 16 who are enrolled in [place] or [place]; pregnant women with a Medicaid card; and children who have applied for Medicaid or [place] Health Choice, but were declined, whose parents can pay a reduced fee at time of service.  [facility]  [place] [phone] Patients are seen by appointment only. Walk-ins are not accepted. [place] will see patients 31 years of age and older. Monday - Tuesday (  8am-5pm) Most Wednesdays (8:30-5pm) $30 per visit, cash only  [facility]  [address], [facility] [phone] Patients are seen by appointment only. Walk-ins are not accepted. [place] will see patients 67 years of age and older. One Wednesday Evening (Monthly: Volunteer Based).  $30 per visit, cash only  [place]  [phone] for adults; Children under age 73, call Graduate Pediatric Dentistry at [phone]. Children aged 83-14, please call [phone] to request a pediatric application.  Dental services are provided in all areas of dental care including fillings, crowns and bridges, complete and partial dentures, implants, gum treatment, root canals, and extractions. Preventive care is also provided. Treatment is provided to both adults and children. Patients are selected via a lottery and there is often a waiting list.   [facility] [address], [place]  [phone] [email or web address]   [facility] [address] [place], [place] [phone], Ext.  123 Second and Fourth Thursday of each month, opens at 6:30 AM; Clinic ends at 9 AM.  Patients are seen on a first-come first-served basis, and a limited number are seen during each clinic.   [facility]  [address] Hillard Danker [place], [place] [phone]   Eligibility Requirements You must have lived in [place], [place], or [place] counties for at least the last three months.   You cannot be eligible for state or federal sponsored [employer], including [employer], [place], or [employer].   You generally cannot be eligible for healthcare insurance through your employer.    How to apply: Eligibility screenings are held every Tuesday and Wednesday afternoon from 1:00 pm until 4:00 pm. You do not need an appointment for the interview!  [facility] [address], [place], [place]   [place] Department  [place]  [phone]    Behavioral Health Resources in the Community: Intensive Outpatient Programs Organization         Address  Phone  Notes  [place]. [address], [place], [place] [phone]   [facility] Outpatient [address] [place], [place]   [facility]: [facility] [address] [place], [place]   [place] [address]  [place], [place] or [phone]   Substance Abuse Resources Organization         Address  Phone  Notes  [facility]  [phone]   [place]  [phone]   [facility] [place]   Chinita Pester  [phone]   Residential & Outpatient Substance Abuse Program  [phone]   Psychological Services Organization         Address  Phone  Notes  [facility] [place]  [place]  [phone]   [place] [address] [place] or [phone]    Mobile Crisis Teams Organization         Address  Phone  Notes  [facility], Mobile Crisis Care Unit  [phone]   [facility]  [address] [place], [place]   [place] [address] [place] [phone]    Self-Help/Support Groups Organization         Address  Phone  Notes  [facility] [place] - variety of support groups  336- I7437963 Call for more information  Narcotics Anonymous (NA), [facility] [address], [employer] [place]  2 meetings at this location   Statistician         Address  Phone  Notes  [facility] [address],    [place]  [phone]   [facility]  [address], [place] [address], [place], [place] [phone]   [facility] [address] [place], [place] [phone] Admissions: 8am-3pm M-F  [facility] [address]    [place], [place] [phone]   [facility] [address] [place], [place], [place] [phone]   [facility] [address]  [place], [place] [phone]   [facility] - Intensive Outpatient [address], [place], [place] [phone]   [facility] (Addiction Recovery [facility] [address] [place].,  [place], [place] [phone] or [phone]   [facility] (RTS) [address] [place], [place] [phone] Accepts Medicaid  [facility] [address]  [place] [phone] Substance Abuse/Addiction Treatment   [facility] Organization         Address  Phone  Notes  [facility]  [phone]   Angie Fava, PhD [address] Ervin Knack [place], [place]   [phone] or [phone]   [facility]al   [address] [place], [place] [phone]   [facility] [address] [place], [place] [phone] Insurance/Medicaid/sponsorship through [facility] and Families [address]                                    [place], [place] [phone] Therapy/tele-psych/case  [facility] [address], [place] [phone]    Dr. Lolly Mustache  [phone]   [facility] [place]  [employer] [facility] Dept. 1) [address], [place] 2) [address], [place] 3)  [address], [place] [phone]  [phone]   [facility] Child Abuse Hotline [phone] or 5804196392 (After Hours)

## 2015-03-01 NOTE — ED Provider Notes (Signed)
   Medical screening examination/treatment/procedure(s) were performed by non-physician practitioner and as supervising physician I was immediately available for consultation/collaboration.   EKG Interpretation   Date/Time:  Tuesday February 28 2015 15:48:59 EST Ventricular Rate:  120 PR Interval:  217 QRS Duration: 183 QT Interval:  360 QTC Calculation: 509 R Axis:   156 Text Interpretation:  Sinus tachycardia Prolonged PR interval LAE,  consider biatrial enlargement Left bundle branch block Baseline wander No  significant change since last tracing 01 Jan 2015 Confirmed by Delorice Bannister   MD-I, Leviticus Harton ([id number]) on 03/01/2015 1:02:46 AM     Devoria Albe, MD, Concha Pyo, MD 03/01/15 [phone]

## 2015-03-01 NOTE — ED Provider Notes (Signed)
Chest pain, cough. History CHF (systolic), noncompliant with medications. Cardiology [facility]) visit yesterday, routine.  Subjective fever, cough, CP x 4 days. Negative evaluation here, unchanged EKG, tachycardia (history of the same); trop x 2 negative. D-dimer pending - if negative can be discharged.   D-dimer negative - no suspicion for PE. Re-evaluation: the patient is requesting something for cough and pain. Issues addressed. She can be discharged home according to care plan of previous tx team.   Elpidio Anis, PA-C 03/02/15 1610  Devoria Albe, MD 03/03/15 2300

## 2015-03-01 NOTE — Telephone Encounter (Signed)
Pt no showed , she has no showed and canceled appt for the past year or so. She will call for refills until her next scheduled appt that she never shows up to. What should I do when she calls for another appt that she will not keep

## 2015-03-01 NOTE — Telephone Encounter (Signed)
Pt last seen here in Feb 2015, she has cancelled and no showed every appt since then.  Dr Gala Romney is it ok to send her a dismissal letter?

## 2015-03-02 ENCOUNTER — Encounter (HOSPITAL_COMMUNITY): Payer: Self-pay | Admitting: *Deleted

## 2015-03-02 ENCOUNTER — Encounter (HOSPITAL_COMMUNITY): Payer: Self-pay | Admitting: Cardiology

## 2015-03-02 NOTE — Telephone Encounter (Signed)
Dismissal letter completed, signed by Dr Gala Romney and will be sent certified to pt

## 2015-03-02 NOTE — Telephone Encounter (Signed)
Yes. Please dismiss.

## 2015-03-31 ENCOUNTER — Telehealth (HOSPITAL_COMMUNITY): Payer: Self-pay

## 2015-03-31 NOTE — Telephone Encounter (Signed)
Certified letter of dismissal from services with Dr. Gala RomneyBensimhon and CHF clinic returned to sender. Return comments state "unclaimed, unable to forward". Letter was mailed 1 month ago to date. This RN reached out to pharmacy to verify contact information for patient in their records, and they confirm same phone number and address.  However, patient has not had any further medications sent to her since out last refills so hard to say for sure if these are still accurate contacts for patient. In speaking with Director of [facility] Clinic [facility], we will forward this information to risk management to ensure proper next steps are taken in this matter.  Laurie Wade, Laurie Wade

## 2015-05-08 DEATH — deceased

## 2015-06-08 DEATH — deceased
